# Patient Record
Sex: Male | Born: 1935 | Race: White | Hispanic: No | Marital: Married | Smoking: Former smoker
Health system: Southern US, Community
[De-identification: ages and names within clinical notes are randomized; demographics above are authoritative.]

## PROBLEM LIST (undated history)

## (undated) DIAGNOSIS — K859 Acute pancreatitis without necrosis or infection, unspecified: Secondary | ICD-10-CM

## (undated) DIAGNOSIS — I509 Heart failure, unspecified: Secondary | ICD-10-CM

## (undated) DIAGNOSIS — Z950 Presence of cardiac pacemaker: Secondary | ICD-10-CM

## (undated) DIAGNOSIS — N400 Enlarged prostate without lower urinary tract symptoms: Secondary | ICD-10-CM

## (undated) DIAGNOSIS — K219 Gastro-esophageal reflux disease without esophagitis: Secondary | ICD-10-CM

## (undated) DIAGNOSIS — J189 Pneumonia, unspecified organism: Secondary | ICD-10-CM

## (undated) DIAGNOSIS — J45909 Unspecified asthma, uncomplicated: Secondary | ICD-10-CM

## (undated) DIAGNOSIS — R0600 Dyspnea, unspecified: Secondary | ICD-10-CM

## (undated) DIAGNOSIS — I4891 Unspecified atrial fibrillation: Secondary | ICD-10-CM

## (undated) DIAGNOSIS — J449 Chronic obstructive pulmonary disease, unspecified: Secondary | ICD-10-CM

## (undated) DIAGNOSIS — C801 Malignant (primary) neoplasm, unspecified: Secondary | ICD-10-CM

## (undated) DIAGNOSIS — C679 Malignant neoplasm of bladder, unspecified: Secondary | ICD-10-CM

## (undated) HISTORY — PX: BI-VENTRICULAR PACEMAKER INSERTION (CRT-P): SHX5750

## (undated) HISTORY — PX: HERNIA REPAIR: SHX51

## (undated) HISTORY — DX: Pneumonia, unspecified organism: J18.9

## (undated) HISTORY — PX: FRACTURE SURGERY: SHX138

## (undated) HISTORY — DX: Gastro-esophageal reflux disease without esophagitis: K21.9

## (undated) HISTORY — PX: CARDIOVERSION: SHX1299

## (undated) HISTORY — PX: BLADDER SURGERY: SHX569

## (undated) HISTORY — DX: Acute pancreatitis without necrosis or infection, unspecified: K85.90

## (undated) HISTORY — PX: LUNG REMOVAL, PARTIAL: SHX233

## (undated) HISTORY — DX: Benign prostatic hyperplasia without lower urinary tract symptoms: N40.0

## (undated) HISTORY — PX: INSERT / REPLACE / REMOVE PACEMAKER: SUR710

---
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
## 2004-12-09 ENCOUNTER — Ambulatory Visit (HOSPITAL_COMMUNITY): Admission: RE | Admit: 2004-12-09 | Discharge: 2004-12-09 | Payer: Self-pay | Admitting: Internal Medicine

## 2013-08-10 DIAGNOSIS — I2699 Other pulmonary embolism without acute cor pulmonale: Secondary | ICD-10-CM

## 2013-08-10 HISTORY — DX: Other pulmonary embolism without acute cor pulmonale: I26.99

## 2013-09-26 DIAGNOSIS — I82409 Acute embolism and thrombosis of unspecified deep veins of unspecified lower extremity: Secondary | ICD-10-CM

## 2013-09-26 HISTORY — DX: Acute embolism and thrombosis of unspecified deep veins of unspecified lower extremity: I82.409

## 2015-02-28 DIAGNOSIS — C3412 Malignant neoplasm of upper lobe, left bronchus or lung: Secondary | ICD-10-CM

## 2015-02-28 HISTORY — DX: Malignant neoplasm of upper lobe, left bronchus or lung: C34.12

## 2015-09-28 DIAGNOSIS — I2510 Atherosclerotic heart disease of native coronary artery without angina pectoris: Secondary | ICD-10-CM | POA: Insufficient documentation

## 2015-09-28 DIAGNOSIS — J449 Chronic obstructive pulmonary disease, unspecified: Secondary | ICD-10-CM

## 2015-09-28 HISTORY — DX: Atherosclerotic heart disease of native coronary artery without angina pectoris: I25.10

## 2015-09-28 HISTORY — DX: Chronic obstructive pulmonary disease, unspecified: J44.9

## 2015-12-01 DIAGNOSIS — I4891 Unspecified atrial fibrillation: Secondary | ICD-10-CM | POA: Diagnosis not present

## 2015-12-01 DIAGNOSIS — K859 Acute pancreatitis without necrosis or infection, unspecified: Secondary | ICD-10-CM | POA: Diagnosis not present

## 2016-01-18 DIAGNOSIS — Z950 Presence of cardiac pacemaker: Secondary | ICD-10-CM

## 2016-01-18 DIAGNOSIS — Z9889 Other specified postprocedural states: Secondary | ICD-10-CM | POA: Insufficient documentation

## 2016-01-18 HISTORY — DX: Other specified postprocedural states: Z98.890

## 2016-01-18 HISTORY — PX: AV NODE ABLATION: EP1193

## 2016-01-18 HISTORY — DX: Presence of cardiac pacemaker: Z95.0

## 2016-06-01 DIAGNOSIS — J8410 Pulmonary fibrosis, unspecified: Secondary | ICD-10-CM

## 2016-06-01 HISTORY — DX: Pulmonary fibrosis, unspecified: J84.10

## 2016-12-31 ENCOUNTER — Encounter: Payer: Self-pay | Admitting: Cardiology

## 2016-12-31 DIAGNOSIS — E785 Hyperlipidemia, unspecified: Secondary | ICD-10-CM

## 2016-12-31 DIAGNOSIS — Z9981 Dependence on supplemental oxygen: Secondary | ICD-10-CM

## 2016-12-31 DIAGNOSIS — G4733 Obstructive sleep apnea (adult) (pediatric): Secondary | ICD-10-CM

## 2016-12-31 DIAGNOSIS — I5030 Unspecified diastolic (congestive) heart failure: Secondary | ICD-10-CM

## 2016-12-31 DIAGNOSIS — Z7901 Long term (current) use of anticoagulants: Secondary | ICD-10-CM

## 2016-12-31 DIAGNOSIS — I482 Chronic atrial fibrillation, unspecified: Secondary | ICD-10-CM

## 2016-12-31 HISTORY — DX: Hyperlipidemia, unspecified: E78.5

## 2016-12-31 HISTORY — DX: Long term (current) use of anticoagulants: Z79.01

## 2016-12-31 HISTORY — DX: Dependence on supplemental oxygen: Z99.81

## 2016-12-31 HISTORY — DX: Chronic atrial fibrillation, unspecified: I48.20

## 2016-12-31 HISTORY — DX: Unspecified diastolic (congestive) heart failure: I50.30

## 2016-12-31 HISTORY — DX: Obstructive sleep apnea (adult) (pediatric): G47.33

## 2017-01-01 ENCOUNTER — Encounter: Payer: Self-pay | Admitting: Cardiology

## 2017-01-01 ENCOUNTER — Ambulatory Visit (INDEPENDENT_AMBULATORY_CARE_PROVIDER_SITE_OTHER): Payer: Medicare Other | Admitting: Cardiology

## 2017-01-01 VITALS — BP 112/76 | HR 80 | Ht 72.0 in | Wt 195.0 lb

## 2017-01-01 DIAGNOSIS — Z7901 Long term (current) use of anticoagulants: Secondary | ICD-10-CM | POA: Diagnosis not present

## 2017-01-01 DIAGNOSIS — I482 Chronic atrial fibrillation, unspecified: Secondary | ICD-10-CM

## 2017-01-01 DIAGNOSIS — I5032 Chronic diastolic (congestive) heart failure: Secondary | ICD-10-CM

## 2017-01-01 DIAGNOSIS — Z9889 Other specified postprocedural states: Secondary | ICD-10-CM | POA: Diagnosis not present

## 2017-01-01 DIAGNOSIS — Z950 Presence of cardiac pacemaker: Secondary | ICD-10-CM | POA: Diagnosis not present

## 2017-01-01 NOTE — Patient Instructions (Signed)
Medication Instructions:  Your physician recommends that you continue on your current medications as directed. Please refer to the Current Medication list given to you today.   Labwork: Your physician recommends that you return for lab work in: today. BMP, BNP, pt/INR.   Testing/Procedures: None  Follow-Up: Your physician wants you to follow-up in: 6 months. You will receive a reminder letter in the mail two months in advance. If you don't receive a letter, please call our office to schedule the follow-up appointment.   Any Other Special Instructions Will Be Listed Below (If Applicable).     If you need a refill on your cardiac medications before your next appointment, please call your pharmacy.

## 2017-01-01 NOTE — Progress Notes (Signed)
Cardiology Office Note:    Date:  01/01/2017   ID:  Jerome Newman, DOB Jul 31, 1929, MRN [id number]  PCP:  Dr Delorise Jackson  Cardiologist:  Shirlee More, MD       ASSESSMENT:    1. Chronic diastolic congestive heart failure ([place])   2. Chronic atrial fibrillation (HCC)   3. Status post biventricular pacemaker   4. S/P AV nodal ablation   5. Chronic anticoagulation    PLAN:    In order of problems listed above:  1. Stable compensated continue his current diuretic sodium restriction at home self management. Will check renal function and BNP level today with ongoing diuretic treatment. 2. Stable improved with AV nodal ablation and pacemaker asymptomatic and continue anti-coagulation 3. Stable continue to follow in device clinic. 4. Stable pacemaker dependent. 5. Stable continue warfarin goal INR 2-3.5, long-term is interested in home pro time monitoring.  Next appointment: 6 month   Medication Adjustments/Labs and Tests Ordered: Current medicines are reviewed at length with the patient today.  Concerns regarding medicines are outlined above.  Orders Placed This Encounter  Procedures  . INR/PT  . Basic Metabolic Panel (BMET)  . B Nat Peptide   No orders of the defined types were placed in this encounter.   Chief Complaint  Patient presents with  . Follow-up    Routine flup appt     History of Present Illness:    Jerome Newman is a 83 y.o. male with a hx of lung Cancer, CHF, chronic Atrial Fibrillation, AVN ablation and BiV pacemaker, chronic anticoagulation for AF and previous remote DVT and PE. He was seen at [facility] ED in April with weakness.Overall he is remarkably improved with physical therapy strength and endurance are nearly back to normal no shortness of breath chest pain palpitations syncope or bleeding complication of his warfarin. He's had no recurrent episodes of weakness. His pacemaker is followed at [place] [facility] device [facility]     Compliance with diet,  lifestyle and medications: Yes Past Medical History:  Diagnosis Date  . BPH (benign prostatic hyperplasia)   . GERD (gastroesophageal reflux disease)   . Pancreatitis   . Pneumonia     Past Surgical History:  Procedure Laterality Date  . AV NODE ABLATION  01/18/2016  . BI-VENTRICULAR PACEMAKER INSERTION (CRT-P)    . CARDIOVERSION    . HERNIA REPAIR    . LUNG REMOVAL, PARTIAL Right     Current Medications: Current Meds  Medication Sig  . potassium chloride SA (KLOR-CON M20) 20 MEQ tablet Take 20 mEq by mouth daily.  Marland Kitchen warfarin (COUMADIN) 2.5 MG tablet Currently taking 1.25mg /1.25mg /2.5mg      Allergies:   Gabapentin and Nsaids   Social History   Social History  . Marital status: Married    Spouse name: N/A  . Number of children: N/A  . Years of education: N/A   Social History Main Topics  . Smoking status: Former Research scientist (life sciences)  . Smokeless tobacco: Never Used  . Alcohol use None  . Drug use: Unknown  . Sexual activity: Not Asked   Other Topics Concern  . None   Social History Narrative  . None     Family History: The patient's family history includes Cancer in his sister; Heart disease in his father. ROS:   Please see the history of present illness.    All other systems reviewed and are negative.  EKGs/Labs/Other Studies Reviewed:    The following studies were reviewed  [facility] including [facility]  and labs.  Physical Exam:    VS:  BP 112/76   Pulse 80   Ht 6' (1.829 m)   Wt 195 lb (88.5 kg)   SpO2 97%   BMI 26.45 kg/m     Wt Readings from Last 3 Encounters:  01/01/17 195 lb (88.5 kg)     GEN:  Well nourished, well developed in no acute distress HEENT: Normal NECK: No JVD; No carotid bruits LYMPHATICS: No lymphadenopathy CARDIAC: Variable first heart sound paced rhythm RRR, no murmurs, rubs, gallops RESPIRATORY:  Clear to auscultation without rales, wheezing or rhonchi  ABDOMEN: Soft, non-tender, non-distended MUSCULOSKELETAL:   No edema; No deformity  SKIN: Warm and dry NEUROLOGIC:  Alert and oriented x 3 PSYCHIATRIC:  Normal affect    Signed, Shirlee More, MD  01/01/2017 4:48 PM    [facility]

## 2017-01-03 ENCOUNTER — Ambulatory Visit (INDEPENDENT_AMBULATORY_CARE_PROVIDER_SITE_OTHER): Payer: BLUE CROSS/BLUE SHIELD | Admitting: Pharmacist

## 2017-01-03 DIAGNOSIS — I482 Chronic atrial fibrillation, unspecified: Secondary | ICD-10-CM

## 2017-01-03 DIAGNOSIS — Z5181 Encounter for therapeutic drug level monitoring: Secondary | ICD-10-CM

## 2017-01-03 DIAGNOSIS — Z7901 Long term (current) use of anticoagulants: Secondary | ICD-10-CM

## 2017-01-06 LAB — BASIC METABOLIC PANEL
BUN/Creatinine Ratio: 16 (ref 10–24)
BUN: 18 mg/dL (ref 8–27)
CALCIUM: 9.9 mg/dL (ref 8.6–10.2)
CO2: 26 mmol/L (ref 20–29)
CREATININE: 1.16 mg/dL (ref 0.76–1.27)
Chloride: 100 mmol/L (ref 96–106)
GFR calc Af Amer: 66 mL/min/{1.73_m2} (ref >59)
GFR, EST NON AFRICAN AMERICAN: 57 mL/min/{1.73_m2} — AB (ref >59)
Glucose: 98 mg/dL (ref 65–99)
Potassium: 4.3 mmol/L (ref 3.5–5.2)
SODIUM: 141 mmol/L (ref 134–144)

## 2017-01-06 LAB — PROTIME-INR
INR: 3.4 — ABNORMAL HIGH (ref 0.8–1.2)
Prothrombin Time: 33 s — ABNORMAL HIGH (ref 9.1–12.0)

## 2017-01-06 LAB — BRAIN NATRIURETIC PEPTIDE

## 2017-01-10 ENCOUNTER — Other Ambulatory Visit: Payer: Self-pay | Admitting: Cardiology

## 2017-01-16 ENCOUNTER — Telehealth: Payer: Self-pay

## 2017-01-16 DIAGNOSIS — Z7901 Long term (current) use of anticoagulants: Secondary | ICD-10-CM

## 2017-01-16 NOTE — Telephone Encounter (Signed)
Pt walk into the office today for pt/inr blood draw for management of warfarin.

## 2017-01-17 ENCOUNTER — Ambulatory Visit (INDEPENDENT_AMBULATORY_CARE_PROVIDER_SITE_OTHER): Payer: BLUE CROSS/BLUE SHIELD | Admitting: Pharmacist

## 2017-01-17 DIAGNOSIS — Z5181 Encounter for therapeutic drug level monitoring: Secondary | ICD-10-CM

## 2017-01-17 DIAGNOSIS — I482 Chronic atrial fibrillation, unspecified: Secondary | ICD-10-CM

## 2017-01-17 DIAGNOSIS — Z7901 Long term (current) use of anticoagulants: Secondary | ICD-10-CM

## 2017-01-17 LAB — PROTIME-INR
INR: 3.3 — ABNORMAL HIGH (ref 0.8–1.2)
PROTHROMBIN TIME: 32.1 s — AB (ref 9.1–12.0)

## 2017-02-01 ENCOUNTER — Other Ambulatory Visit: Payer: Self-pay

## 2017-02-01 ENCOUNTER — Telehealth: Payer: Self-pay

## 2017-02-01 MED ORDER — POTASSIUM CHLORIDE CRYS ER 20 MEQ PO TBCR: 20.0000 meq | EXTENDED_RELEASE_TABLET | Freq: Every day | ORAL | 3 refills | Status: DC

## 2017-02-01 NOTE — Telephone Encounter (Signed)
Refill sent to [facility] and pharmacy changed to [facility].

## 2017-02-01 NOTE — Telephone Encounter (Signed)
Patient would like to have potassium sent to [id number] and change pharmacy to [facility] for all meds .thanks cn

## 2017-02-19 ENCOUNTER — Ambulatory Visit (INDEPENDENT_AMBULATORY_CARE_PROVIDER_SITE_OTHER): Payer: Medicare Other

## 2017-02-19 DIAGNOSIS — Z7901 Long term (current) use of anticoagulants: Secondary | ICD-10-CM | POA: Diagnosis not present

## 2017-02-19 DIAGNOSIS — I482 Chronic atrial fibrillation, unspecified: Secondary | ICD-10-CM

## 2017-02-19 DIAGNOSIS — Z5181 Encounter for therapeutic drug level monitoring: Secondary | ICD-10-CM

## 2017-02-19 LAB — POCT INR: INR: 2.9

## 2017-03-21 ENCOUNTER — Ambulatory Visit (INDEPENDENT_AMBULATORY_CARE_PROVIDER_SITE_OTHER): Payer: Medicare Other | Admitting: Cardiovascular Disease

## 2017-03-21 DIAGNOSIS — I482 Chronic atrial fibrillation, unspecified: Secondary | ICD-10-CM

## 2017-03-21 DIAGNOSIS — Z5181 Encounter for therapeutic drug level monitoring: Secondary | ICD-10-CM | POA: Diagnosis not present

## 2017-03-21 DIAGNOSIS — Z7901 Long term (current) use of anticoagulants: Secondary | ICD-10-CM

## 2017-03-21 LAB — POCT INR: INR: 2.9

## 2017-04-25 ENCOUNTER — Ambulatory Visit (INDEPENDENT_AMBULATORY_CARE_PROVIDER_SITE_OTHER): Payer: Medicare Other | Admitting: *Deleted

## 2017-04-25 DIAGNOSIS — Z5181 Encounter for therapeutic drug level monitoring: Secondary | ICD-10-CM | POA: Diagnosis not present

## 2017-04-25 DIAGNOSIS — I482 Chronic atrial fibrillation, unspecified: Secondary | ICD-10-CM

## 2017-04-25 DIAGNOSIS — Z7901 Long term (current) use of anticoagulants: Secondary | ICD-10-CM

## 2017-04-25 LAB — POCT INR: INR: 3.9

## 2017-05-13 ENCOUNTER — Ambulatory Visit (INDEPENDENT_AMBULATORY_CARE_PROVIDER_SITE_OTHER): Payer: Medicare Other

## 2017-05-13 ENCOUNTER — Other Ambulatory Visit: Payer: Self-pay | Admitting: Cardiology

## 2017-05-13 DIAGNOSIS — Z5181 Encounter for therapeutic drug level monitoring: Secondary | ICD-10-CM | POA: Diagnosis not present

## 2017-05-13 DIAGNOSIS — Z7901 Long term (current) use of anticoagulants: Secondary | ICD-10-CM

## 2017-05-13 DIAGNOSIS — I482 Chronic atrial fibrillation, unspecified: Secondary | ICD-10-CM

## 2017-05-13 DIAGNOSIS — I2699 Other pulmonary embolism without acute cor pulmonale: Secondary | ICD-10-CM

## 2017-05-13 LAB — PROTIME-INR
INR: 2.5 — AB (ref 0.8–1.2)
INR: 2.5 — AB (ref 0.9–1.1)
PROTHROMBIN TIME: 26.9 s — AB (ref 9.1–12.0)

## 2017-06-03 ENCOUNTER — Ambulatory Visit (INDEPENDENT_AMBULATORY_CARE_PROVIDER_SITE_OTHER): Payer: Medicare Other | Admitting: *Deleted

## 2017-06-03 DIAGNOSIS — Z5181 Encounter for therapeutic drug level monitoring: Secondary | ICD-10-CM | POA: Diagnosis not present

## 2017-06-03 DIAGNOSIS — I482 Chronic atrial fibrillation, unspecified: Secondary | ICD-10-CM

## 2017-06-03 DIAGNOSIS — Z7901 Long term (current) use of anticoagulants: Secondary | ICD-10-CM | POA: Diagnosis not present

## 2017-06-03 DIAGNOSIS — I2699 Other pulmonary embolism without acute cor pulmonale: Secondary | ICD-10-CM

## 2017-06-03 LAB — POCT INR: INR: 3

## 2017-06-03 NOTE — Patient Instructions (Signed)
Continue taking 1/2 tablet daily except 1 tablet on Tuesdays and Fridays. Recheck INR in 4 weeks.

## 2017-06-10 ENCOUNTER — Emergency Department (HOSPITAL_COMMUNITY)
Admission: EM | Admit: 2017-06-10 | Discharge: 2017-06-10 | Disposition: A | Discharge status: Home / Self Care | Payer: Medicare Other | Attending: Emergency Medicine | Admitting: Emergency Medicine

## 2017-06-10 ENCOUNTER — Other Ambulatory Visit: Payer: Self-pay

## 2017-06-10 ENCOUNTER — Encounter (HOSPITAL_COMMUNITY): Payer: Self-pay

## 2017-06-10 DIAGNOSIS — I5030 Unspecified diastolic (congestive) heart failure: Secondary | ICD-10-CM | POA: Insufficient documentation

## 2017-06-10 DIAGNOSIS — M545 Low back pain: Secondary | ICD-10-CM | POA: Diagnosis present

## 2017-06-10 DIAGNOSIS — Z87891 Personal history of nicotine dependence: Secondary | ICD-10-CM | POA: Diagnosis not present

## 2017-06-10 DIAGNOSIS — J449 Chronic obstructive pulmonary disease, unspecified: Secondary | ICD-10-CM | POA: Insufficient documentation

## 2017-06-10 DIAGNOSIS — Z950 Presence of cardiac pacemaker: Secondary | ICD-10-CM | POA: Insufficient documentation

## 2017-06-10 DIAGNOSIS — Z7901 Long term (current) use of anticoagulants: Secondary | ICD-10-CM | POA: Insufficient documentation

## 2017-06-10 DIAGNOSIS — I2510 Atherosclerotic heart disease of native coronary artery without angina pectoris: Secondary | ICD-10-CM | POA: Diagnosis not present

## 2017-06-10 DIAGNOSIS — M5441 Lumbago with sciatica, right side: Secondary | ICD-10-CM | POA: Insufficient documentation

## 2017-06-10 DIAGNOSIS — Z79899 Other long term (current) drug therapy: Secondary | ICD-10-CM | POA: Insufficient documentation

## 2017-06-10 LAB — URINALYSIS, ROUTINE W REFLEX MICROSCOPIC
Bacteria, UA: NONE SEEN
Bilirubin Urine: NEGATIVE
Glucose, UA: NEGATIVE mg/dL
Hgb urine dipstick: NEGATIVE
Ketones, ur: 5 mg/dL — AB
Leukocytes, UA: NEGATIVE
Nitrite: NEGATIVE
Protein, ur: 100 mg/dL — AB
Specific Gravity, Urine: 1.024 (ref 1.005–1.030)
pH: 5 (ref 5.0–8.0)

## 2017-06-10 LAB — CBC WITH DIFFERENTIAL/PLATELET
Basophils Absolute: 0 10*3/uL (ref 0.0–0.1)
Basophils Relative: 0 %
Eosinophils Absolute: 0.2 10*3/uL (ref 0.0–0.7)
Eosinophils Relative: 3 %
HCT: 37.5 % — ABNORMAL LOW (ref 39.0–52.0)
Hemoglobin: 12.4 g/dL — ABNORMAL LOW (ref 13.0–17.0)
Lymphocytes Relative: 27 %
Lymphs Abs: 1.4 10*3/uL (ref 0.7–4.0)
MCH: 28.9 pg (ref 26.0–34.0)
MCHC: 33.1 g/dL (ref 30.0–36.0)
MCV: 87.4 fL (ref 78.0–100.0)
Monocytes Absolute: 0.2 10*3/uL (ref 0.1–1.0)
Monocytes Relative: 4 %
Neutro Abs: 3.3 10*3/uL (ref 1.7–7.7)
Neutrophils Relative %: 66 %
Platelets: 141 10*3/uL — ABNORMAL LOW (ref 150–400)
RBC: 4.29 MIL/uL (ref 4.22–5.81)
RDW: 14.9 % (ref 11.5–15.5)
WBC: 5.1 10*3/uL (ref 4.0–10.5)

## 2017-06-10 LAB — LIPASE, BLOOD: Lipase: 21 U/L (ref 11–51)

## 2017-06-10 LAB — COMPREHENSIVE METABOLIC PANEL
ALT: 12 U/L — ABNORMAL LOW (ref 17–63)
AST: 21 U/L (ref 15–41)
Albumin: 3.4 g/dL — ABNORMAL LOW (ref 3.5–5.0)
Alkaline Phosphatase: 99 U/L (ref 38–126)
Anion gap: 7 (ref 5–15)
BUN: 20 mg/dL (ref 6–20)
CO2: 24 mmol/L (ref 22–32)
Calcium: 9.3 mg/dL (ref 8.9–10.3)
Chloride: 108 mmol/L (ref 101–111)
Creatinine, Ser: 1.15 mg/dL (ref 0.61–1.24)
GFR calc Af Amer: >60 mL/min (ref >60)
GFR calc non Af Amer: 56 mL/min — ABNORMAL LOW (ref >60)
Glucose, Bld: 88 mg/dL (ref 65–99)
Potassium: 4.2 mmol/L (ref 3.5–5.1)
Sodium: 139 mmol/L (ref 135–145)
Total Bilirubin: 0.9 mg/dL (ref 0.3–1.2)
Total Protein: 5.7 g/dL — ABNORMAL LOW (ref 6.5–8.1)

## 2017-06-10 MED ORDER — OXYCODONE-ACETAMINOPHEN 5-325 MG PO TABS
1.0000 | ORAL_TABLET | ORAL | Status: DC | PRN
  Administered 2017-06-10: 1 via ORAL
  Filled 2017-06-10: qty 1

## 2017-06-10 MED ORDER — KETOROLAC TROMETHAMINE 15 MG/ML IJ SOLN
15.0000 mg | Freq: Once | INTRAMUSCULAR | Status: AC
  Administered 2017-06-10: 15 mg via INTRAMUSCULAR
  Filled 2017-06-10: qty 1

## 2017-06-10 MED ORDER — DEXAMETHASONE SODIUM PHOSPHATE 10 MG/ML IJ SOLN
10.0000 mg | Freq: Once | INTRAMUSCULAR | Status: AC
  Administered 2017-06-10: 10 mg via INTRAMUSCULAR
  Filled 2017-06-10: qty 1

## 2017-06-10 MED ORDER — OXYCODONE-ACETAMINOPHEN 5-325 MG PO TABS: 1.0000 | ORAL_TABLET | ORAL | 0 refills | Status: DC | PRN

## 2017-06-10 MED ORDER — HYDROMORPHONE HCL 1 MG/ML IJ SOLN
0.5000 mg | Freq: Once | INTRAMUSCULAR | Status: AC
  Administered 2017-06-10: 0.5 mg via INTRAMUSCULAR
  Filled 2017-06-10: qty 1

## 2017-06-10 MED ORDER — LORAZEPAM 0.5 MG PO TABS
0.5000 mg | ORAL_TABLET | Freq: Once | ORAL | Status: AC
  Administered 2017-06-10: 0.5 mg via ORAL
  Filled 2017-06-10: qty 1

## 2017-06-10 MED ORDER — HYDROMORPHONE HCL 1 MG/ML IJ SOLN
0.6000 mg | Freq: Once | INTRAMUSCULAR | Status: AC
  Administered 2017-06-10: 0.6 mg via INTRAMUSCULAR
  Filled 2017-06-10: qty 1

## 2017-06-10 MED ORDER — DEXAMETHASONE 4 MG PO TABS: 4.0000 mg | ORAL_TABLET | Freq: Two times a day (BID) | ORAL | 0 refills | Status: DC

## 2017-06-10 NOTE — Discharge Instructions (Signed)
Take decadron (steroid) until finished. Avoid NSAIDs (ibuprofen, aleve, etc) since you are on coumadin. Take pain medicine as needed.

## 2017-06-10 NOTE — ED Provider Notes (Signed)
[facility] EMERGENCY DEPARTMENT Provider Note   CSN: [id number] Arrival date & time: 06/10/17  1427     History   Chief Complaint Chief Complaint  Patient presents with  . Back Pain    HPI Jerome Newman is a 83 y.o. male.  HPI  83 year old male with back pain.  Lower back.  Radiation into right lower extremity.  Worsening over the past several days.  He has a past history of back pain but not to this degree.  He denies any acute trauma or strain.  No fevers or chills.  No urinary complaints.  No acute numbness or tingling or focal loss of strength.  Past Medical History:  Diagnosis Date  . BPH (benign prostatic hyperplasia)   . GERD (gastroesophageal reflux disease)   . Pancreatitis   . Pneumonia     Patient Active Problem List   Diagnosis Date Noted  . Encounter for therapeutic drug monitoring 01/03/2017  . Diastolic congestive heart failure ([place]) 12/31/2016  . Hyperlipidemia 12/31/2016  . OSA (obstructive sleep apnea) 12/31/2016  . Oxygen dependent 12/31/2016  . Chronic anticoagulation 12/31/2016  . Chronic atrial fibrillation ([place]) 12/31/2016  . Lung fibrosis ([place]) 06/01/2016  . Status post biventricular pacemaker 01/18/2016  . S/P AV nodal ablation 01/18/2016  . CAD (coronary artery disease) 09/28/2015  . COPD, mild ([place]) 09/28/2015  . Malignant neoplasm of upper lobe of left lung ([place]) 02/28/2015  . DVT (deep venous thrombosis) ([place]) 09/26/2013  . Pulmonary embolism ([place]) 08/10/2013    Past Surgical History:  Procedure Laterality Date  . AV NODE ABLATION  01/18/2016  . BI-VENTRICULAR PACEMAKER INSERTION (CRT-P)    . CARDIOVERSION    . HERNIA REPAIR    . LUNG REMOVAL, PARTIAL Right        Home Medications    Prior to Admission medications   Medication Sig Start Date End Date Taking? Authorizing Provider  acetaminophen (TYLENOL) 500 MG tablet Take 500 mg by mouth 2 (two) times daily.   Yes [provider]    Glycerin-Hypromellose-PEG 400 ([employer] DRY EYE RELIEF) 0.2-0.2-1 % SOLN Place 1 drop into both eyes 4 (four) times daily.   Yes [provider]  mirabegron ER (MYRBETRIQ) 25 MG TB24 tablet Take 25 mg by mouth daily.   Yes [provider]  pantoprazole (PROTONIX) 40 MG tablet Take 40 mg by mouth 2 (two) times daily. 12/27/16  Yes [provider]  potassium chloride SA (KLOR-CON M20) 20 MEQ tablet Take 1 tablet (20 mEq total) by mouth daily. 02/01/17  Yes Richardo Priest, MD  torsemide (DEMADEX) 10 MG tablet TAKE 1 TABLET BY MOUTH ONCE DAILY. Patient taking differently: TAKE 10 mg  TABLET BY MOUTH ONCE DAILY. 01/10/17  Yes Richardo Priest, MD  warfarin (COUMADIN) 2.5 MG tablet Take by mouth one time only at 6 PM. Currently taking 2.5 mg on Tuesday and Friday 1.25 mg  Monday, Wednesday, Thursday, Saturday and Sunday 11/22/16  Yes [provider]  dexamethasone (DECADRON) 4 MG tablet Take 1 tablet (4 mg total) by mouth 2 (two) times daily with a meal. 06/10/17   Virgel Manifold, MD  oxyCODONE-acetaminophen (PERCOCET/ROXICET) 5-325 MG tablet Take 1-2 tablets by mouth every 4 (four) hours as needed for severe pain. 06/10/17   Virgel Manifold, MD    Family History Family History  Problem Relation Age of Onset  . Heart disease Father   . Cancer Sister     Social History Social History   Tobacco  Use  . Smoking status: Former Smoker  . Smokeless tobacco: Never Used  Substance Use Topics  . Alcohol use: Not on file  . Drug use: Not on file     Allergies   Gabapentin and Nsaids   Review of Systems Review of Systems All systems reviewed and negative, other than as noted in HPI.   Physical Exam Updated Vital Signs BP 131/71   Pulse 86   Temp 98.1 F (36.7 C) (Axillary)   Resp 17   Ht 6' (1.829 m)   Wt 88.5 kg (195 lb)   SpO2 95%   BMI 26.45 kg/m   Physical Exam  Constitutional: He appears well-developed and well-nourished. No distress.  HENT:   Head: Normocephalic and atraumatic.  Eyes: Conjunctivae are normal. Right eye exhibits no discharge. Left eye exhibits no discharge.  Neck: Neck supple.  Cardiovascular: Normal rate, regular rhythm and normal heart sounds. Exam reveals no gallop and no friction rub.  No murmur heard. Pulmonary/Chest: Effort normal and breath sounds normal. No respiratory distress.  Abdominal: Soft. He exhibits no distension. There is no tenderness.  Musculoskeletal: He exhibits no edema or tenderness.  Tenderness to palpation in the right paralumbar spine.  No overlying skin changes.  Sensation is intact to light touch bilateral lower extremities.  Strength is 5 out of 5 although he does have significant pain with movement.  Positive straight leg test bilaterally.  Neurological: He is alert.  Skin: Skin is warm and dry.  Psychiatric: He has a normal mood and affect. His behavior is normal. Thought content normal.  Nursing note and vitals reviewed.    ED Treatments / Results  Labs (all labs ordered are listed, but only abnormal results are displayed) Labs Reviewed  COMPREHENSIVE METABOLIC PANEL - Abnormal; Notable for the following components:      Result Value   Total Protein 5.7 (*)    Albumin 3.4 (*)    ALT 12 (*)    GFR calc non Af Amer 56 (*)    All other components within normal limits  URINALYSIS, ROUTINE W REFLEX MICROSCOPIC - Abnormal; Notable for the following components:   Ketones, ur 5 (*)    Protein, ur 100 (*)    Squamous Epithelial / LPF 0-5 (*)    All other components within normal limits  CBC WITH DIFFERENTIAL/PLATELET - Abnormal; Notable for the following components:   Hemoglobin 12.4 (*)    HCT 37.5 (*)    Platelets 141 (*)    All other components within normal limits  LIPASE, BLOOD    EKG  EKG Interpretation None       Radiology No results found.  Procedures Procedures (including critical care time)  Medications Ordered in ED Medications   oxyCODONE-acetaminophen (PERCOCET/ROXICET) 5-325 MG per tablet 1 tablet (1 tablet Oral Given 06/10/17 1443)  HYDROmorphone (DILAUDID) injection 0.6 mg (0.6 mg Intramuscular Given 06/10/17 1826)  dexamethasone (DECADRON) injection 10 mg (10 mg Intramuscular Given 06/10/17 1826)  LORazepam (ATIVAN) tablet 0.5 mg (0.5 mg Oral Given 06/10/17 1827)  ketorolac (TORADOL) 15 MG/ML injection 15 mg (15 mg Intramuscular Given 06/10/17 2210)  HYDROmorphone (DILAUDID) injection 0.5 mg (0.5 mg Intramuscular Given 06/10/17 2210)     Initial Impression / Assessment and Plan / ED Course  I have reviewed the triage vital signs and the nursing notes.  Pertinent labs & imaging results that were available during my care of the patient were reviewed by me and considered in my medical decision making (see chart  for details).     83 year old male with lower back pain.  Some radicular symptoms into right lower extremity.  Nonfocal neurological exam.  He is treated symptomatically with some improvement.  He was stood at bedside and took a couple steps.  He states that he is feeling better but is still having significant pain.  Additional medicine was ordered.  He was offered admission for ongoing symptomatic treatment.  He is electing to go home and continue symptomatic treatment.  He has a pacemaker precluding MRI.  Advise following up with family doctor, potentially neurosurgery if he has significant continued symptoms.  Emergent return precautions were discussed  Final Clinical Impressions(s) / ED Diagnoses   Final diagnoses:  Acute midline low back pain with right-sided sciatica    ED Discharge Orders        Ordered    oxyCODONE-acetaminophen (PERCOCET/ROXICET) 5-325 MG tablet  Every 4 hours PRN     06/10/17 2220    dexamethasone (DECADRON) 4 MG tablet  2 times daily with meals     06/10/17 2220       Virgel Manifold, MD 06/18/17 1024

## 2017-06-10 NOTE — ED Triage Notes (Signed)
Per PT and family, pt is coming from home with back pain. Hx of the same and reports that it has gotten worse. Hx of arthritis and herniated discs.

## 2017-06-28 NOTE — Progress Notes (Signed)
Cardiology Office Note:    Date:  07/01/2017   ID:  Jerome Newman, DOB 1930-05-28, MRN [id number]  PCP:  [name]., MD  Cardiologist:  Shirlee More, MD    Referring MD: [name]., MD    ASSESSMENT:    1. Chronic atrial fibrillation ([place])   2. Chronic anticoagulation   3. Status post biventricular pacemaker   4. Chronic diastolic congestive heart failure (HCC)   5. Other pulmonary embolism without acute cor pulmonale, unspecified chronicity (HCC)    PLAN:    In order of problems listed above:  1. Stable he is asymptomatic since AV nodal ablation and biventricular pacemaker and will remain anticoagulated.  His device is followed through [facility]/ He will switch follow-up to my practice in July 2. continue warfarin goal INR is 2.5 managed in my practice 3. Stable function  4. Stable compensated continue current diuretic check BMP regarding renal function potassium 5. Stable continue anticoagulation 6.    Next appointment: 6 months   Medication Adjustments/Labs and Tests Ordered: Current medicines are reviewed at length with the patient today.  Concerns regarding medicines are outlined above.  No orders of the defined types were placed in this encounter.  No orders of the defined types were placed in this encounter.   Chief Complaint  Patient presents with  . Follow-up    History of Present Illness:    Jerome Newman is a 83 y.o. male with a hx of lung Cancer, CHF, chronic Atrial Fibrillation, AVN ablation and BiV pacemaker, chronic anticoagulation for AF and previous remote DVT and PE.  last seen in July 2018. His pacemaker care is with EP at [facility] in [place].last device check in this note. Compliance with diet, lifestyle and medications: Yes His predominant problem now is low back pain.  He is not having shortness of breath chest pain palpitations syncope or bleeding complications of his anticoagulant Past Medical History:  Diagnosis Date  . BPH (benign  prostatic hyperplasia)   . GERD (gastroesophageal reflux disease)   . Pancreatitis   . Pneumonia     Past Surgical History:  Procedure Laterality Date  . AV NODE ABLATION  01/18/2016  . BI-VENTRICULAR PACEMAKER INSERTION (CRT-P)    . CARDIOVERSION    . HERNIA REPAIR    . LUNG REMOVAL, PARTIAL Right     Current Medications: Current Meds  Medication Sig  . acetaminophen (TYLENOL) 500 MG tablet Take 500 mg by mouth 2 (two) times daily.  . mirabegron ER (MYRBETRIQ) 25 MG TB24 tablet Take 25 mg by mouth daily.  . pantoprazole (PROTONIX) 40 MG tablet Take 40 mg by mouth 2 (two) times daily.  . potassium chloride SA (KLOR-CON M20) 20 MEQ tablet Take 1 tablet (20 mEq total) by mouth daily.  Marland Kitchen torsemide (DEMADEX) 10 MG tablet TAKE 1 TABLET BY MOUTH ONCE DAILY.  Marland Kitchen warfarin (COUMADIN) 2.5 MG tablet Take by mouth one time only at 6 PM. Currently taking 2.5 mg on Tuesday and Friday 1.25 mg  Monday, Wednesday, Thursday, Saturday and Sunday     Allergies:   Gabapentin and Nsaids   Social History   Socioeconomic History  . Marital status: Married    Spouse name: None  . Number of children: None  . Years of education: None  . Highest education level: None  Social Needs  . Financial resource strain: None  . Food insecurity - worry: None  . Food insecurity - inability: None  . Transportation needs - medical: None  .  Transportation needs - non-medical: None  Occupational History  . None  Tobacco Use  . Smoking status: Former Research scientist (life sciences)  . Smokeless tobacco: Never Used  Substance and Sexual Activity  . Alcohol use: None  . Drug use: None  . Sexual activity: None  Other Topics Concern  . None  Social History Narrative  . None     Family History: The patient's family history includes Cancer in his sister; Heart disease in his father. ROS:   Please see the history of present illness.    All other systems reviewed and are negative.  EKGs/Labs/Other Studies Reviewed:    The  following studies were reviewed today:  Device check: Burna Forts, [place] - 04/01/2017 3:30 PM EDT REMOTE MONITORING ASSESSMENT Date of Transmission: October 1,2018 Patient MRN: [id number] Patient Name: Jerome Newman, 22-Jul-1929, 83 y.o. Following Provider: Mahala Menghini, MD Manufacturer of Device: [employer] Type of Device: CRT-P / BiVentricular Pacemaker See scanned/downloaded PDF report for the model numbers, serial numbers, and date of implant. Presenting Rhythm: BVP 85 Percentage RV / BiV Pacing: >99% Biventricular Pacing Device Findings: Scheduled test; Leads OK; Battery OK; No episodes; Meds include Coumadin Please see downloaded PDF file of transmission under Media Tab for full details of device interrogation to include, when applicable, battery status/charge time, lead trend data, and programmed parameters. Congestive Heart Failure Surveillance: Evidence of Possible Accumulated Fluid Plan: Findings forwarded to office for further review of Fluid issue.   CT chest 05/07/17:  IMPRESSION: 1. Evolving radiation changes in the left upper lobe. No residual or recurrent mass lesion identified. 2. Remote right upper lobe resection. 3. No evidence of local recurrence or metastatic disease. 4. Stable chronic left lower lobe pulmonary nodule consistent with a benign finding. 5. Extensive atherosclerosis, including Aortic Atherosclerosis  Recent Labs: 01/01/2017: BNP CANCELED 06/10/2017: ALT 12; BUN 20; Creatinine, Ser 1.15; Hemoglobin 12.4; Platelets 141; Potassium 4.2; Sodium 139  Recent Lipid Panel No results found for: CHOL, TRIG, HDL, CHOLHDL, VLDL, LDLCALC, LDLDIRECT  Physical Exam:    VS:  BP 102/68 (BP Location: Right Arm, Patient Position: Sitting, Cuff Size: Normal)   Pulse 71   Ht 6' (1.829 m)   Wt 198 lb (89.8 kg)   SpO2 91%   BMI 26.85 kg/m     Wt Readings from Last 3 Encounters:  07/01/17 198 lb (89.8 kg)  06/10/17 195 lb (88.5 kg)  01/01/17 195 lb (88.5 kg)       GEN:  Well nourished, well developed in no acute distress HEENT: Normal NECK: No JVD; No carotid bruits LYMPHATICS: No lymphadenopathy CARDIAC: RRR, no murmurs, rubs, gallops RESPIRATORY:  Clear to auscultation without rales, wheezing or rhonchi  ABDOMEN: Soft, non-tender, non-distended MUSCULOSKELETAL:  No edema; No deformity  SKIN: Warm and dry NEUROLOGIC:  Alert and oriented x 3 PSYCHIATRIC:  Normal affect    Signed, Shirlee More, MD  07/01/2017 1:32 PM    [facility]

## 2017-07-01 ENCOUNTER — Ambulatory Visit (INDEPENDENT_AMBULATORY_CARE_PROVIDER_SITE_OTHER): Payer: Medicare Other | Admitting: Cardiology

## 2017-07-01 ENCOUNTER — Ambulatory Visit (INDEPENDENT_AMBULATORY_CARE_PROVIDER_SITE_OTHER): Payer: Medicare Other | Admitting: *Deleted

## 2017-07-01 ENCOUNTER — Encounter: Payer: Self-pay | Admitting: Cardiology

## 2017-07-01 VITALS — BP 102/68 | HR 71 | Ht 72.0 in | Wt 198.0 lb

## 2017-07-01 DIAGNOSIS — I5032 Chronic diastolic (congestive) heart failure: Secondary | ICD-10-CM | POA: Diagnosis not present

## 2017-07-01 DIAGNOSIS — I482 Chronic atrial fibrillation, unspecified: Secondary | ICD-10-CM

## 2017-07-01 DIAGNOSIS — Z950 Presence of cardiac pacemaker: Secondary | ICD-10-CM | POA: Diagnosis not present

## 2017-07-01 DIAGNOSIS — Z7901 Long term (current) use of anticoagulants: Secondary | ICD-10-CM

## 2017-07-01 DIAGNOSIS — I2699 Other pulmonary embolism without acute cor pulmonale: Secondary | ICD-10-CM

## 2017-07-01 DIAGNOSIS — Z5181 Encounter for therapeutic drug level monitoring: Secondary | ICD-10-CM

## 2017-07-01 LAB — POCT INR: INR: 4.4

## 2017-07-01 NOTE — Patient Instructions (Signed)
Medication Instructions:  Your physician recommends that you continue on your current medications as directed. Please refer to the Current Medication list given to you today.  Labwork: Your physician recommends that you return for lab work in: today. BMP, CBC  Testing/Procedures: None  Follow-Up: Your physician wants you to follow-up in: 7 months. You will receive a reminder letter in the mail two months in advance. If you don't receive a letter, please call our office to schedule the follow-up appointment.  Any Other Special Instructions Will Be Listed Below (If Applicable).     If you need a refill on your cardiac medications before your next appointment, please call your pharmacy.

## 2017-07-01 NOTE — Patient Instructions (Signed)
Description   Do not take any Coumadin today then continue taking 1/2 tablet daily except 1 tablet on Tuesdays and Fridays. Have something dark green leafy today & remain consistent. Recheck INR in 2 weeks.

## 2017-07-02 LAB — BASIC METABOLIC PANEL
BUN/Creatinine Ratio: 16 (ref 10–24)
BUN: 18 mg/dL (ref 8–27)
CALCIUM: 9.7 mg/dL (ref 8.6–10.2)
CHLORIDE: 103 mmol/L (ref 96–106)
CO2: 25 mmol/L (ref 20–29)
Creatinine, Ser: 1.1 mg/dL (ref 0.76–1.27)
GFR calc non Af Amer: 61 mL/min/{1.73_m2} (ref >59)
GFR, EST AFRICAN AMERICAN: 70 mL/min/{1.73_m2} (ref >59)
GLUCOSE: 88 mg/dL (ref 65–99)
POTASSIUM: 4.4 mmol/L (ref 3.5–5.2)
Sodium: 145 mmol/L — ABNORMAL HIGH (ref 134–144)

## 2017-07-02 LAB — CBC WITH DIFFERENTIAL/PLATELET
BASOS ABS: 0 10*3/uL (ref 0.0–0.2)
Basos: 0 %
EOS (ABSOLUTE): 0.1 10*3/uL (ref 0.0–0.4)
Eos: 1 %
Hematocrit: 41.5 % (ref 37.5–51.0)
Hemoglobin: 14.1 g/dL (ref 13.0–17.7)
IMMATURE GRANS (ABS): 0 10*3/uL (ref 0.0–0.1)
Immature Granulocytes: 0 %
LYMPHS: 19 %
Lymphocytes Absolute: 1.5 10*3/uL (ref 0.7–3.1)
MCH: 29.2 pg (ref 26.6–33.0)
MCHC: 34 g/dL (ref 31.5–35.7)
MCV: 86 fL (ref 79–97)
Monocytes Absolute: 0.3 10*3/uL (ref 0.1–0.9)
Monocytes: 4 %
NEUTROS ABS: 5.8 10*3/uL (ref 1.4–7.0)
Neutrophils: 76 %
PLATELETS: 201 10*3/uL (ref 150–379)
RBC: 4.83 x10E6/uL (ref 4.14–5.80)
RDW: 15.6 % — ABNORMAL HIGH (ref 12.3–15.4)
WBC: 7.8 10*3/uL (ref 3.4–10.8)

## 2017-07-15 ENCOUNTER — Ambulatory Visit (INDEPENDENT_AMBULATORY_CARE_PROVIDER_SITE_OTHER): Payer: Medicare Other | Admitting: *Deleted

## 2017-07-15 DIAGNOSIS — Z5181 Encounter for therapeutic drug level monitoring: Secondary | ICD-10-CM | POA: Diagnosis not present

## 2017-07-15 DIAGNOSIS — I482 Chronic atrial fibrillation, unspecified: Secondary | ICD-10-CM

## 2017-07-15 DIAGNOSIS — I2699 Other pulmonary embolism without acute cor pulmonale: Secondary | ICD-10-CM

## 2017-07-15 DIAGNOSIS — Z7901 Long term (current) use of anticoagulants: Secondary | ICD-10-CM

## 2017-07-15 LAB — PROTIME-INR
INR: 2.8 — ABNORMAL HIGH (ref 0.8–1.2)
Prothrombin Time: 27.5 s — ABNORMAL HIGH (ref 9.1–12.0)

## 2017-07-29 ENCOUNTER — Ambulatory Visit (INDEPENDENT_AMBULATORY_CARE_PROVIDER_SITE_OTHER): Payer: Medicare Other | Admitting: *Deleted

## 2017-07-29 DIAGNOSIS — I482 Chronic atrial fibrillation, unspecified: Secondary | ICD-10-CM

## 2017-07-29 DIAGNOSIS — I2699 Other pulmonary embolism without acute cor pulmonale: Secondary | ICD-10-CM | POA: Diagnosis not present

## 2017-07-29 DIAGNOSIS — Z7901 Long term (current) use of anticoagulants: Secondary | ICD-10-CM

## 2017-07-29 DIAGNOSIS — Z5181 Encounter for therapeutic drug level monitoring: Secondary | ICD-10-CM | POA: Diagnosis not present

## 2017-07-29 LAB — POCT INR: INR: 3.3

## 2017-07-29 NOTE — Patient Instructions (Signed)
Description   Continue taking 1/2 tablet daily except 1 tablet on Tuesdays and Fridays. Remain consistent with leafy green vegetables.  Recheck INR in 3 weeks.

## 2017-08-19 ENCOUNTER — Ambulatory Visit (INDEPENDENT_AMBULATORY_CARE_PROVIDER_SITE_OTHER): Payer: Medicare Other

## 2017-08-19 DIAGNOSIS — Z5181 Encounter for therapeutic drug level monitoring: Secondary | ICD-10-CM

## 2017-08-19 DIAGNOSIS — Z7901 Long term (current) use of anticoagulants: Secondary | ICD-10-CM | POA: Diagnosis not present

## 2017-08-19 DIAGNOSIS — I482 Chronic atrial fibrillation, unspecified: Secondary | ICD-10-CM

## 2017-08-19 DIAGNOSIS — I2699 Other pulmonary embolism without acute cor pulmonale: Secondary | ICD-10-CM

## 2017-08-19 LAB — POCT INR: INR: 2.9

## 2017-08-19 NOTE — Patient Instructions (Signed)
Description   Spoke with pt's wife advised to have pt continue on same dosage 1/2 tablet daily except 1 tablet on Tuesdays and Fridays. Remain consistent with leafy green vegetables.  Recheck INR in 4 weeks.

## 2017-09-16 ENCOUNTER — Ambulatory Visit (INDEPENDENT_AMBULATORY_CARE_PROVIDER_SITE_OTHER): Payer: Medicare Other

## 2017-09-16 DIAGNOSIS — I2699 Other pulmonary embolism without acute cor pulmonale: Secondary | ICD-10-CM | POA: Diagnosis not present

## 2017-09-16 DIAGNOSIS — Z7901 Long term (current) use of anticoagulants: Secondary | ICD-10-CM

## 2017-09-16 DIAGNOSIS — I482 Chronic atrial fibrillation, unspecified: Secondary | ICD-10-CM

## 2017-09-16 DIAGNOSIS — Z5181 Encounter for therapeutic drug level monitoring: Secondary | ICD-10-CM | POA: Diagnosis not present

## 2017-09-16 LAB — POCT INR: INR: 3.8

## 2017-09-16 NOTE — Patient Instructions (Signed)
Description   Hold today's dose and then continue on same dosage 1/2 tablet daily except 1 tablet on Tuesdays and Fridays. Remain consistent with leafy green vegetables. Recheck INR in 2 weeks.

## 2017-09-30 ENCOUNTER — Other Ambulatory Visit: Payer: Self-pay

## 2017-09-30 ENCOUNTER — Ambulatory Visit (INDEPENDENT_AMBULATORY_CARE_PROVIDER_SITE_OTHER): Payer: Medicare Other

## 2017-09-30 DIAGNOSIS — I482 Chronic atrial fibrillation, unspecified: Secondary | ICD-10-CM

## 2017-09-30 DIAGNOSIS — Z5181 Encounter for therapeutic drug level monitoring: Secondary | ICD-10-CM | POA: Diagnosis not present

## 2017-09-30 DIAGNOSIS — Z7901 Long term (current) use of anticoagulants: Secondary | ICD-10-CM | POA: Diagnosis not present

## 2017-09-30 DIAGNOSIS — I2699 Other pulmonary embolism without acute cor pulmonale: Secondary | ICD-10-CM | POA: Diagnosis not present

## 2017-09-30 LAB — POCT INR: INR: 3.6

## 2017-09-30 NOTE — Patient Instructions (Signed)
Description   Hold today's dose, then proceed to take 1/2 tablet (1.25 mg) daily except for 1 tablet (2.5 mg) on Friday's. Remain consistent with leafy green vegetables. Recheck INR in 2 weeks.

## 2017-10-14 ENCOUNTER — Ambulatory Visit (INDEPENDENT_AMBULATORY_CARE_PROVIDER_SITE_OTHER): Payer: Medicare Other

## 2017-10-14 DIAGNOSIS — I482 Chronic atrial fibrillation, unspecified: Secondary | ICD-10-CM

## 2017-10-14 DIAGNOSIS — I2699 Other pulmonary embolism without acute cor pulmonale: Secondary | ICD-10-CM | POA: Diagnosis not present

## 2017-10-14 DIAGNOSIS — Z7901 Long term (current) use of anticoagulants: Secondary | ICD-10-CM | POA: Diagnosis not present

## 2017-10-14 DIAGNOSIS — Z5181 Encounter for therapeutic drug level monitoring: Secondary | ICD-10-CM | POA: Diagnosis not present

## 2017-10-14 LAB — POCT INR: INR: 3.5

## 2017-10-14 NOTE — Patient Instructions (Signed)
Description   Continue to take 1/2 tablet (1.25 mg) daily except for 1 tablet (2.5 mg) on Friday's. Remain consistent with leafy green vegetables. Recheck INR in 4 weeks. Please call the office with any medication changes or additions [phone].

## 2017-11-01 ENCOUNTER — Ambulatory Visit (INDEPENDENT_AMBULATORY_CARE_PROVIDER_SITE_OTHER): Payer: Self-pay | Admitting: Orthopaedic Surgery

## 2017-11-11 ENCOUNTER — Ambulatory Visit (INDEPENDENT_AMBULATORY_CARE_PROVIDER_SITE_OTHER): Payer: Medicare Other

## 2017-11-11 DIAGNOSIS — I2699 Other pulmonary embolism without acute cor pulmonale: Secondary | ICD-10-CM

## 2017-11-11 DIAGNOSIS — I482 Chronic atrial fibrillation, unspecified: Secondary | ICD-10-CM

## 2017-11-11 DIAGNOSIS — Z7901 Long term (current) use of anticoagulants: Secondary | ICD-10-CM | POA: Diagnosis not present

## 2017-11-11 DIAGNOSIS — Z5181 Encounter for therapeutic drug level monitoring: Secondary | ICD-10-CM

## 2017-11-11 LAB — POCT INR: INR: 3.2

## 2017-11-11 NOTE — Patient Instructions (Addendum)
Description   Continue to take 1/2 tablet (1.25 mg) daily except for 1 tablet (2.5 mg) on Friday's. Remain consistent with leafy green vegetables. Recheck INR in 6 weeks. Please call the office with any medication changes or additions [phone].    Please discontinue aspirin prescribed at hospital per Dr. Bettina Gavia.

## 2017-11-18 ENCOUNTER — Ambulatory Visit: Payer: Medicare Other | Admitting: Cardiology

## 2017-11-26 NOTE — Progress Notes (Signed)
Cardiology Office Note:    Date:  11/27/2017   ID:  Jerome Newman, DOB 13-Oct-1929, MRN [id number]  PCP:  Jerome Skeens., MD  Cardiologist:  Jerome More, MD    Referring MD: Jerome Skeens., MD    ASSESSMENT:    1. Chronic atrial fibrillation (Jerome Newman)   2. Chronic anticoagulation   3. S/P AV nodal ablation   4. Chronic diastolic congestive heart failure (HCC)   5. Status post biventricular pacemaker    PLAN:    In order of problems listed above:  1. Stable rate controlled after AV nodal ablation and permanent pacemaker continue anticoagulation 2. Stable continue warfarin he does not want to take a direct agent 3. Stable permanent pacemaker function is normal 4. Compensated continue his diuretic I do not know why it was stopped as he did not have orthostatic hypotension or signs of hypovolemia 5. Stable following our device clinic   Next appointment: 3 months   Medication Adjustments/Labs and Tests Ordered: Current medicines are reviewed at length with the patient today.  Concerns regarding medicines are outlined above.  No orders of the defined types were placed in this encounter.  No orders of the defined types were placed in this encounter.   Chief Complaint  Patient presents with  . Hospitalization Follow-up    dizzy episodes  . Atrial Fibrillation  . Anticoagulation  . Congestive Heart Failure    History of Present Illness:    Jerome Newman is a 84 y.o. male with a hx of lung Cancer, CHF, chronic Atrial Fibrillation, AVN ablation and BiV pacemaker, chronic anticoagulation for AF and previous remote DVT and PE.  last seen in 07/01/17. His pacemaker care is with EP at [facility] in [place]. He is referred by Dr Jerome Newman. Admitted to [facility]:  Admit date: 10/31/2017 Discharge date and time: 11/01/2017 10:23 AM  Admission Diagnoses: Presyncope  Discharge Diagnoses:  Principal Problem: Dizziness  Admission Condition: fair Discharged Condition: fair  Indication for  Admission: Presyncope likely from toresemide.   Hospital Course:  84 year old man with a history of chronic atrial fibrillation s/p biventricular pacemaker status post AV node ablation, CKD, COPD, GERD, systolic CHF, hypertension, pancreatitis, Stage II squamous cell lung cancer, RUL lobectomy who presented on Thursday 10/31/2017 with dizziness, extremity weakness, and headache. Patient admitted to observation for further workup. He was also complaining of left arm weakness and right leg numbness.. Neurology consulted not an IV tPA candidate as his INR is > 1.7 at 2.81 and platelets was low at 47. Recommended CTA which showed anterior and posterior intracranial circulation. No large vessel occlusion, aneurysm, or significant stenosis, also showed advanced cervical spondylosis with multifactorial moderate to severe canal stenosis at C5-6. MRI brain not compatible with pacemaker. TTE ordered showed Mild global LV hypokinesis. Ejection fraction is visually estimated at 45-50. Will hold off on new ACE-I or beta blocker due to dizziness. Will let Dr. Bettina Newman assess patient and decide.   Echo 11/01/17: Mild global LV hypokinesis. Ejection fraction is visually estimated at 45-50% Mild mitral regurgitation. There is mild aortic sclerosis noted, with no evidence of stenosis. Mild aortic regurgitation. Mild tricuspid regurgitation . Mildly dilated left atrium.  CTA neck: IMPRESSION: 1. Patent carotid and vertebral arteries. No dissection, aneurysm, or hemodynamically significant stenosis utilizing NASCET criteria. 2. Patent anterior and posterior intracranial circulation. No large vessel occlusion, aneurysm, or significant stenosis. 3. Advanced cervical spondylosis with multifactorial moderate to severe canal stenosis at C5-6.  INR 2.5 Plts 126000  Compliance with diet, lifestyle and medications: yes I reviewed the history with the patient and his wife.  The day that he was placed in observation he felt  badly weak vaguely unsteady which is been a common problem and was worse when he is taking medications to help with his urinary stream likely alpha blockers.  After CT scan he is particularly weak and unsteady was brought to the emergency room which resulted in observation.  His symptoms were not focal and were not TIA in nature.  His wife tells me his pacemaker is interrogated although I cannot find documentation in its function was normal.  He was seen by neurologist and CT of the cervical spine shows severe disc disease with cord stenosis.  Since then he is returned home his diuretic was stopped his weight went up several pounds and is back on his diuretic.  He has had no specific symptoms since he remains anticoagulated with warfarin at his choice and therapeutic. Past Medical History:  Diagnosis Date  . BPH (benign prostatic hyperplasia)   . GERD (gastroesophageal reflux disease)   . Pancreatitis   . Pneumonia     Past Surgical History:  Procedure Laterality Date  . AV NODE ABLATION  01/18/2016  . BI-VENTRICULAR PACEMAKER INSERTION (CRT-P)    . CARDIOVERSION    . HERNIA REPAIR    . LUNG REMOVAL, PARTIAL Right     Current Medications: Current Meds  Medication Sig  . acetaminophen (TYLENOL) 500 MG tablet Take 500 mg by mouth 2 (two) times daily.  Marland Kitchen atorvastatin (LIPITOR) 40 MG tablet Take 40 mg by mouth daily at 6 PM.  . pantoprazole (PROTONIX) 40 MG tablet Take 40 mg by mouth 2 (two) times daily.  . potassium chloride SA (KLOR-CON M20) 20 MEQ tablet Take 1 tablet (20 mEq total) by mouth daily.  Marland Kitchen torsemide (DEMADEX) 10 MG tablet TAKE 1 TABLET BY MOUTH ONCE DAILY.  Marland Kitchen warfarin (COUMADIN) 2.5 MG tablet Take by mouth one time only at 6 PM. Currently taking 2.5 mg on Friday 1.25 mg  Monday, Tuesday, Wednesday, Thursday, Saturday and Sunday     Allergies:   Gabapentin and Nsaids   Social History   Socioeconomic History  . Marital status: Married    Spouse name: Not on file  .  Number of children: Not on file  . Years of education: Not on file  . Highest education level: Not on file  Occupational History  . Not on file  Social Needs  . Financial resource strain: Not on file  . Food insecurity:    Worry: Not on file    Inability: Not on file  . Transportation needs:    Medical: Not on file    Non-medical: Not on file  Tobacco Use  . Smoking status: Former Research scientist (life sciences)  . Smokeless tobacco: Never Used  Substance and Sexual Activity  . Alcohol use: Not Currently  . Drug use: Not Currently  . Sexual activity: Not on file  Lifestyle  . Physical activity:    Days per week: Not on file    Minutes per session: Not on file  . Stress: Not on file  Relationships  . Social connections:    Talks on phone: Not on file    Gets together: Not on file    Attends religious service: Not on file    Active member of club or organization: Not on file    Attends meetings of clubs or organizations: Not on file  Relationship status: Not on file  Other Topics Concern  . Not on file  Social History Narrative  . Not on file     Family History: The patient's family history includes Cancer in his sister; Heart disease in his father. ROS:   Please see the history of present illness.    All other systems reviewed and are negative.  EKGs/Labs/Other Studies Reviewed:    The following studies were reviewed today:  Recent Labs: 01/01/2017: BNP CANCELED 06/10/2017: ALT 12 07/01/2017: BUN 18; Creatinine, Ser 1.10; Hemoglobin 14.1; Platelets 201; Potassium 4.4; Sodium 145  Recent Lipid Panel No results found for: CHOL, TRIG, HDL, CHOLHDL, VLDL, LDLCALC, LDLDIRECT  Physical Exam:    VS:  BP 120/80 (Patient Position: Standing)   Pulse 83   Ht 6' (1.829 m)   Wt 197 lb 12.8 oz (89.7 kg)   SpO2 98%   BMI 26.83 kg/m     Wt Readings from Last 3 Encounters:  11/27/17 197 lb 12.8 oz (89.7 kg)  07/01/17 198 lb (89.8 kg)  06/10/17 195 lb (88.5 kg)     GEN:  Well nourished,  well developed in no acute distress HEENT: Normal NECK: No JVD; No carotid bruits LYMPHATICS: No lymphadenopathy CARDIAC: RRR, no murmurs, rubs, gallops RESPIRATORY:  Clear to auscultation without rales, wheezing or rhonchi  ABDOMEN: Soft, non-tender, non-distended MUSCULOSKELETAL:  No edema; No deformity  SKIN: Warm and dry NEUROLOGIC:  Alert and oriented x 3 PSYCHIATRIC:  Normal affect    Signed, Jerome More, MD  11/27/2017 3:42 PM    [facility]

## 2017-11-27 ENCOUNTER — Ambulatory Visit (INDEPENDENT_AMBULATORY_CARE_PROVIDER_SITE_OTHER): Payer: Medicare Other | Admitting: Cardiology

## 2017-11-27 ENCOUNTER — Encounter: Payer: Self-pay | Admitting: Cardiology

## 2017-11-27 VITALS — BP 120/80 | HR 83 | Ht 72.0 in | Wt 197.8 lb

## 2017-11-27 DIAGNOSIS — Z7901 Long term (current) use of anticoagulants: Secondary | ICD-10-CM | POA: Diagnosis not present

## 2017-11-27 DIAGNOSIS — Z9889 Other specified postprocedural states: Secondary | ICD-10-CM

## 2017-11-27 DIAGNOSIS — I5032 Chronic diastolic (congestive) heart failure: Secondary | ICD-10-CM | POA: Diagnosis not present

## 2017-11-27 DIAGNOSIS — Z950 Presence of cardiac pacemaker: Secondary | ICD-10-CM | POA: Diagnosis not present

## 2017-11-27 DIAGNOSIS — I482 Chronic atrial fibrillation, unspecified: Secondary | ICD-10-CM

## 2017-11-27 DIAGNOSIS — Z5181 Encounter for therapeutic drug level monitoring: Secondary | ICD-10-CM | POA: Diagnosis not present

## 2017-11-27 DIAGNOSIS — R42 Dizziness and giddiness: Secondary | ICD-10-CM

## 2017-11-27 LAB — POCT INR: INR: 3.5 — AB (ref 2.0–3.0)

## 2017-11-27 NOTE — Patient Instructions (Signed)
Medication Instructions:  Your physician recommends that you continue on your current medications as directed. Please refer to the Current Medication list given to you today.   Labwork: None  Testing/Procedures: None  Follow-Up: Your physician recommends that you schedule a follow-up appointment in: 3 months with Dr Bettina Gavia   Any Other Special Instructions Will Be Listed Below (If Applicable).     If you need a refill on your cardiac medications before your next appointment, please call your pharmacy.

## 2017-12-03 ENCOUNTER — Telehealth: Payer: Self-pay | Admitting: Cardiology

## 2017-12-03 MED ORDER — ATORVASTATIN CALCIUM 40 MG PO TABS: 40.0000 mg | ORAL_TABLET | Freq: Every day | ORAL | 3 refills | Status: DC

## 2017-12-03 NOTE — Telephone Encounter (Signed)
Refill sent.

## 2017-12-03 NOTE — Telephone Encounter (Signed)
Has questions about his Lipitor

## 2018-01-01 ENCOUNTER — Ambulatory Visit (INDEPENDENT_AMBULATORY_CARE_PROVIDER_SITE_OTHER): Payer: Medicare Other

## 2018-01-01 DIAGNOSIS — I2699 Other pulmonary embolism without acute cor pulmonale: Secondary | ICD-10-CM | POA: Diagnosis not present

## 2018-01-01 DIAGNOSIS — I482 Chronic atrial fibrillation, unspecified: Secondary | ICD-10-CM

## 2018-01-01 DIAGNOSIS — Z7901 Long term (current) use of anticoagulants: Secondary | ICD-10-CM

## 2018-01-01 DIAGNOSIS — Z5181 Encounter for therapeutic drug level monitoring: Secondary | ICD-10-CM | POA: Diagnosis not present

## 2018-01-01 LAB — POCT INR: INR: 2.9 (ref 2.0–3.0)

## 2018-01-01 NOTE — Patient Instructions (Signed)
Description   Continue to take 1/2 tablet (1.25 mg) daily except for 1 tablet (2.5 mg) on Friday's. Remain consistent with leafy green vegetables. Recheck INR in 6 weeks. Please call the office with any medication changes or additions [phone].

## 2018-01-29 ENCOUNTER — Other Ambulatory Visit: Payer: Self-pay | Admitting: Cardiology

## 2018-02-06 ENCOUNTER — Ambulatory Visit (INDEPENDENT_AMBULATORY_CARE_PROVIDER_SITE_OTHER): Payer: Medicare Other

## 2018-02-06 DIAGNOSIS — I482 Chronic atrial fibrillation, unspecified: Secondary | ICD-10-CM

## 2018-02-06 DIAGNOSIS — Z5181 Encounter for therapeutic drug level monitoring: Secondary | ICD-10-CM | POA: Diagnosis not present

## 2018-02-06 DIAGNOSIS — Z7901 Long term (current) use of anticoagulants: Secondary | ICD-10-CM

## 2018-02-06 LAB — POCT INR: INR: 3.2 — AB (ref 2.0–3.0)

## 2018-02-06 NOTE — Patient Instructions (Signed)
Description   Continue to take 1/2 tablet (1.25 mg) daily except for 1 tablet (2.5 mg) on Friday's. Remain consistent with leafy green vegetables. Recheck INR in 6 weeks. Please call the office with any medication changes or additions [phone].

## 2018-03-20 ENCOUNTER — Ambulatory Visit (INDEPENDENT_AMBULATORY_CARE_PROVIDER_SITE_OTHER): Payer: Medicare Other

## 2018-03-20 DIAGNOSIS — Z7901 Long term (current) use of anticoagulants: Secondary | ICD-10-CM | POA: Diagnosis not present

## 2018-03-20 DIAGNOSIS — Z5181 Encounter for therapeutic drug level monitoring: Secondary | ICD-10-CM

## 2018-03-20 DIAGNOSIS — I482 Chronic atrial fibrillation, unspecified: Secondary | ICD-10-CM

## 2018-03-20 LAB — POCT INR: INR: 2.7 (ref 2.0–3.0)

## 2018-03-20 NOTE — Patient Instructions (Signed)
Description   Continue to take 1/2 tablet (1.25 mg) daily except for 1 tablet (2.5 mg) on Friday's. Remain consistent with leafy green vegetables. Recheck INR in 6 weeks. Please call the office with any medication changes or additions [phone].

## 2018-04-03 NOTE — Progress Notes (Signed)
Cardiology Office Note:    Date:  04/04/2018   ID:  Jerome Newman, DOB [birth year], MRN [id number]  PCP:  [name]., MD  Cardiologist:  Shirlee More, MD    Referring MD: [name]., MD    ASSESSMENT:    1. Chronic atrial fibrillation   2. S/P AV nodal ablation   3. Status post biventricular pacemaker   4. Chronic anticoagulation   5. Chronic diastolic congestive heart failure ([place])    PLAN:    In order of problems listed above:  1. Stable asymptomatic we manage his pacemaker in our practice and warfarin for stroke prophylaxis goal INR 2.5 2. Stable from our device clinic not on antiarrhythmic drug 3. Stable function followed in our device clinic in today's EKG he is pacemaker dependent 4. Stable continue his anticoagulant 5. He has no edema no evidence of volume overload check his electrolytes as well as proBNP and a CBC with his anticoagulant therapy   Next appointment: 3 months   Medication Adjustments/Labs and Tests Ordered: Current medicines are reviewed at length with the patient today.  Concerns regarding medicines are outlined above.  No orders of the defined types were placed in this encounter.  No orders of the defined types were placed in this encounter.   Chief Complaint  Patient presents with  . Follow-up    pacemaker  . Atrial Fibrillation  . Anticoagulation    History of Present Illness:    Jerome Newman is a 84 y.o. male with a hx of lung Cancer, CHF, chronic Atrial Fibrillation, AVN ablation and BiV pacemaker, chronic anticoagulation for AF and previous remote DVT and PE  last seen 11/27/17.Marland Kitchen Compliance with diet, lifestyle and medications: Yes  He is not pleased with the quality of his life he has difficulty with physical activity because of pain in his knees he finds himself to be vaguely unsteady after he had an episode of vertigo and short of breath intermittently even at rest with his lung disease and lung cancer and recent  radiation.  He has had hyponatremia in the past and his wife questions if we should check his electrolytes today.  No edema orthopnea chest pain palpitations syncope syncope or bleeding complication on anticoagulant Past Medical History:  Diagnosis Date  . BPH (benign prostatic hyperplasia)   . GERD (gastroesophageal reflux disease)   . Pancreatitis   . Pneumonia     Past Surgical History:  Procedure Laterality Date  . AV NODE ABLATION  01/18/2016  . BI-VENTRICULAR PACEMAKER INSERTION (CRT-P)    . CARDIOVERSION    . HERNIA REPAIR    . LUNG REMOVAL, PARTIAL Right     Current Medications: Current Meds  Medication Sig  . acetaminophen (TYLENOL) 500 MG tablet Take 500 mg by mouth 2 (two) times daily.  Marland Kitchen atorvastatin (LIPITOR) 40 MG tablet Take 1 tablet (40 mg total) by mouth daily at 6 PM.  . pantoprazole (PROTONIX) 40 MG tablet Take 40 mg by mouth 2 (two) times daily.  . potassium chloride SA (K-DUR,KLOR-CON) 20 MEQ tablet TAKE 1 TABLET BY MOUTH ONCE DAILY  . torsemide (DEMADEX) 10 MG tablet TAKE 1 TABLET BY MOUTH ONCE DAILY  . warfarin (COUMADIN) 2.5 MG tablet TAKE 1 TABLET BY MOUTH EVERY DAY WITH EVENING MEAL     Allergies:   Gabapentin and Nsaids   Social History   Socioeconomic History  . Marital status: Married    Spouse name: Not on file  . Number of children: Not  on file  . Years of education: Not on file  . Highest education level: Not on file  Occupational History  . Not on file  Social Needs  . Financial resource strain: Not on file  . Food insecurity:    Worry: Not on file    Inability: Not on file  . Transportation needs:    Medical: Not on file    Non-medical: Not on file  Tobacco Use  . Smoking status: Former Research scientist (life sciences)  . Smokeless tobacco: Never Used  Substance and Sexual Activity  . Alcohol use: Not Currently  . Drug use: Not Currently  . Sexual activity: Not on file  Lifestyle  . Physical activity:    Days per week: Not on file    Minutes per  session: Not on file  . Stress: Not on file  Relationships  . Social connections:    Talks on phone: Not on file    Gets together: Not on file    Attends religious service: Not on file    Active member of club or organization: Not on file    Attends meetings of clubs or organizations: Not on file    Relationship status: Not on file  Other Topics Concern  . Not on file  Social History Narrative  . Not on file     Family History: The patient's family history includes Cancer in his sister; Heart disease in his father. ROS:   Please see the history of present illness.    All other systems reviewed and are negative.  EKGs/Labs/Other Studies Reviewed:    The following studies were reviewed today:  EKG:  EKG ordered today.  The ekg ordered today demonstrates underlying atrial fibrillation 100% ventricularly paced by biV  Recent Labs: 06/10/2017: ALT 12 07/01/2017: BUN 18; Creatinine, Ser 1.10; Hemoglobin 14.1; Platelets 201; Potassium 4.4; Sodium 145  Recent Lipid Panel No results found for: CHOL, TRIG, HDL, CHOLHDL, VLDL, LDLCALC, LDLDIRECT  Physical Exam:    VS:  BP 114/68 (BP Location: Right Arm, Patient Position: Sitting, Cuff Size: Normal)   Pulse 83   Ht 6' (1.829 m)   Wt 196 lb 3.2 oz (89 kg)   SpO2 97%   BMI 26.61 kg/m     Wt Readings from Last 3 Encounters:  04/04/18 196 lb 3.2 oz (89 kg)  11/27/17 197 lb 12.8 oz (89.7 kg)  07/01/17 198 lb (89.8 kg)    Standing blood pressure 120/60 GEN:  Well nourished, well developed in no acute distress HEENT: Normal NECK: No JVD; No carotid bruits LYMPHATICS: No lymphadenopathy CARDIAC: Variable first heart sound RRR, no murmurs, rubs, gallops RESPIRATORY:  Clear to auscultation without rales, wheezing or rhonchi  ABDOMEN: Soft, non-tender, non-distended MUSCULOSKELETAL:  No edema; No deformity  SKIN: Warm and dry NEUROLOGIC:  Alert and oriented x 3 PSYCHIATRIC:  Normal affect    Signed, Shirlee More, MD    04/04/2018 11:05 AM    [place]

## 2018-04-04 ENCOUNTER — Ambulatory Visit (INDEPENDENT_AMBULATORY_CARE_PROVIDER_SITE_OTHER): Payer: Medicare Other | Admitting: Cardiology

## 2018-04-04 VITALS — BP 114/68 | HR 83 | Ht 72.0 in | Wt 196.2 lb

## 2018-04-04 DIAGNOSIS — Z950 Presence of cardiac pacemaker: Secondary | ICD-10-CM | POA: Diagnosis not present

## 2018-04-04 DIAGNOSIS — Z9889 Other specified postprocedural states: Secondary | ICD-10-CM

## 2018-04-04 DIAGNOSIS — Z7901 Long term (current) use of anticoagulants: Secondary | ICD-10-CM | POA: Diagnosis not present

## 2018-04-04 DIAGNOSIS — I5032 Chronic diastolic (congestive) heart failure: Secondary | ICD-10-CM

## 2018-04-04 DIAGNOSIS — I482 Chronic atrial fibrillation, unspecified: Secondary | ICD-10-CM | POA: Diagnosis not present

## 2018-04-04 LAB — BASIC METABOLIC PANEL
BUN/Creatinine Ratio: 19 (ref 10–24)
BUN: 23 mg/dL (ref 8–27)
CHLORIDE: 102 mmol/L (ref 96–106)
CO2: 24 mmol/L (ref 20–29)
Calcium: 10.2 mg/dL (ref 8.6–10.2)
Creatinine, Ser: 1.2 mg/dL (ref 0.76–1.27)
GFR calc Af Amer: 63 mL/min/{1.73_m2} (ref >59)
GFR calc non Af Amer: 54 mL/min/{1.73_m2} — ABNORMAL LOW (ref >59)
GLUCOSE: 93 mg/dL (ref 65–99)
POTASSIUM: 4.2 mmol/L (ref 3.5–5.2)
SODIUM: 142 mmol/L (ref 134–144)

## 2018-04-04 LAB — CBC
HEMATOCRIT: 40.8 % (ref 37.5–51.0)
HEMOGLOBIN: 13.9 g/dL (ref 13.0–17.7)
MCH: 29.6 pg (ref 26.6–33.0)
MCHC: 34.1 g/dL (ref 31.5–35.7)
MCV: 87 fL (ref 79–97)
PLATELETS: 163 10*3/uL (ref 150–450)
RBC: 4.7 x10E6/uL (ref 4.14–5.80)
RDW: 15.6 % — AB (ref 12.3–15.4)
WBC: 5.4 10*3/uL (ref 3.4–10.8)

## 2018-04-04 LAB — PRO B NATRIURETIC PEPTIDE: NT-PRO BNP: 680 pg/mL — AB (ref 0–486)

## 2018-04-04 NOTE — Patient Instructions (Signed)
Medication Instructions:  Your physician recommends that you continue on your current medications as directed. Please refer to the Current Medication list given to you today.   Labwork: Your physician recommends that you return for lab work today: CBC, BMP, ProBNP.   Testing/Procedures: You had an EKG today.   Follow-Up: Your physician wants you to follow-up in: 3 months. You will receive a reminder letter in the mail two months in advance. If you don't receive a letter, please call our office to schedule the follow-up appointment.   If you need a refill on your cardiac medications before your next appointment, please call your pharmacy.   Thank you for choosing [facility]! Robyne Peers, RN [phone]

## 2018-05-01 ENCOUNTER — Ambulatory Visit (INDEPENDENT_AMBULATORY_CARE_PROVIDER_SITE_OTHER): Payer: Medicare Other

## 2018-05-01 DIAGNOSIS — Z5181 Encounter for therapeutic drug level monitoring: Secondary | ICD-10-CM

## 2018-05-01 DIAGNOSIS — I482 Chronic atrial fibrillation, unspecified: Secondary | ICD-10-CM

## 2018-05-01 DIAGNOSIS — Z7901 Long term (current) use of anticoagulants: Secondary | ICD-10-CM

## 2018-05-01 LAB — POCT INR: INR: 2.9 (ref 2.0–3.0)

## 2018-05-01 NOTE — Patient Instructions (Signed)
Description   Continue to take 1/2 tablet (1.25 mg) daily except for 1 tablet (2.5 mg) on Friday's. Remain consistent with leafy green vegetables. Recheck INR in 6 weeks. Please call the office with any medication changes or additions [phone].

## 2018-06-10 ENCOUNTER — Ambulatory Visit (INDEPENDENT_AMBULATORY_CARE_PROVIDER_SITE_OTHER): Payer: Medicare Other | Admitting: Pharmacist

## 2018-06-10 DIAGNOSIS — I482 Chronic atrial fibrillation, unspecified: Secondary | ICD-10-CM

## 2018-06-10 DIAGNOSIS — Z7901 Long term (current) use of anticoagulants: Secondary | ICD-10-CM | POA: Diagnosis not present

## 2018-06-10 DIAGNOSIS — Z5181 Encounter for therapeutic drug level monitoring: Secondary | ICD-10-CM

## 2018-06-10 LAB — POCT INR: INR: 2.9 (ref 2.0–3.0)

## 2018-06-10 NOTE — Patient Instructions (Signed)
Description   Continue to take 1/2 tablet (1.25 mg) daily except for 1 tablet (2.5 mg) on Friday's. Remain consistent with leafy green vegetables. Recheck INR in 6 weeks. Please call the office with any medication changes or additions [phone].

## 2018-07-01 ENCOUNTER — Other Ambulatory Visit: Payer: Self-pay | Admitting: Cardiology

## 2018-07-22 ENCOUNTER — Ambulatory Visit (INDEPENDENT_AMBULATORY_CARE_PROVIDER_SITE_OTHER): Payer: Medicare Other | Admitting: *Deleted

## 2018-07-22 DIAGNOSIS — Z5181 Encounter for therapeutic drug level monitoring: Secondary | ICD-10-CM | POA: Diagnosis not present

## 2018-07-22 DIAGNOSIS — I482 Chronic atrial fibrillation, unspecified: Secondary | ICD-10-CM

## 2018-07-22 DIAGNOSIS — Z7901 Long term (current) use of anticoagulants: Secondary | ICD-10-CM

## 2018-07-22 LAB — POCT INR: INR: 3.1 — AB (ref 2.0–3.0)

## 2018-07-22 NOTE — Patient Instructions (Signed)
Description   Continue to take 1/2 tablet (1.25 mg) daily except for 1 tablet (2.5 mg) on Friday's. Remain consistent with leafy green vegetables. Recheck INR in 6 weeks. Please call the office with any medication changes or additions [phone].

## 2018-08-11 ENCOUNTER — Other Ambulatory Visit: Payer: Self-pay | Admitting: Cardiology

## 2018-08-30 ENCOUNTER — Inpatient Hospital Stay (HOSPITAL_COMMUNITY): Payer: Medicare Other

## 2018-08-30 ENCOUNTER — Other Ambulatory Visit: Payer: Self-pay

## 2018-08-30 ENCOUNTER — Encounter (HOSPITAL_COMMUNITY): Payer: Self-pay

## 2018-08-30 ENCOUNTER — Inpatient Hospital Stay (HOSPITAL_COMMUNITY)
Admission: EM | Admit: 2018-08-30 | Discharge: 2018-09-01 | DRG: 439 | Disposition: A | Discharge status: Home / Self Care | Payer: Medicare Other | Attending: Internal Medicine | Admitting: Internal Medicine

## 2018-08-30 DIAGNOSIS — K8500 Idiopathic acute pancreatitis without necrosis or infection: Principal | ICD-10-CM | POA: Diagnosis present

## 2018-08-30 DIAGNOSIS — Z886 Allergy status to analgesic agent status: Secondary | ICD-10-CM | POA: Diagnosis not present

## 2018-08-30 DIAGNOSIS — J9610 Chronic respiratory failure, unspecified whether with hypoxia or hypercapnia: Secondary | ICD-10-CM | POA: Diagnosis present

## 2018-08-30 DIAGNOSIS — Z923 Personal history of irradiation: Secondary | ICD-10-CM | POA: Diagnosis not present

## 2018-08-30 DIAGNOSIS — I2510 Atherosclerotic heart disease of native coronary artery without angina pectoris: Secondary | ICD-10-CM | POA: Diagnosis present

## 2018-08-30 DIAGNOSIS — J449 Chronic obstructive pulmonary disease, unspecified: Secondary | ICD-10-CM | POA: Diagnosis present

## 2018-08-30 DIAGNOSIS — K859 Acute pancreatitis without necrosis or infection, unspecified: Secondary | ICD-10-CM

## 2018-08-30 DIAGNOSIS — C3491 Malignant neoplasm of unspecified part of right bronchus or lung: Secondary | ICD-10-CM | POA: Diagnosis present

## 2018-08-30 DIAGNOSIS — J8410 Pulmonary fibrosis, unspecified: Secondary | ICD-10-CM | POA: Diagnosis present

## 2018-08-30 DIAGNOSIS — I482 Chronic atrial fibrillation, unspecified: Secondary | ICD-10-CM | POA: Diagnosis present

## 2018-08-30 DIAGNOSIS — Z950 Presence of cardiac pacemaker: Secondary | ICD-10-CM

## 2018-08-30 DIAGNOSIS — G4733 Obstructive sleep apnea (adult) (pediatric): Secondary | ICD-10-CM | POA: Diagnosis present

## 2018-08-30 DIAGNOSIS — Z86711 Personal history of pulmonary embolism: Secondary | ICD-10-CM

## 2018-08-30 DIAGNOSIS — Z79899 Other long term (current) drug therapy: Secondary | ICD-10-CM

## 2018-08-30 DIAGNOSIS — Z9981 Dependence on supplemental oxygen: Secondary | ICD-10-CM | POA: Diagnosis not present

## 2018-08-30 DIAGNOSIS — E785 Hyperlipidemia, unspecified: Secondary | ICD-10-CM | POA: Diagnosis present

## 2018-08-30 DIAGNOSIS — Z902 Acquired absence of lung [part of]: Secondary | ICD-10-CM | POA: Diagnosis not present

## 2018-08-30 DIAGNOSIS — E782 Mixed hyperlipidemia: Secondary | ICD-10-CM | POA: Diagnosis not present

## 2018-08-30 DIAGNOSIS — I2699 Other pulmonary embolism without acute cor pulmonale: Secondary | ICD-10-CM | POA: Diagnosis present

## 2018-08-30 DIAGNOSIS — R112 Nausea with vomiting, unspecified: Secondary | ICD-10-CM | POA: Diagnosis not present

## 2018-08-30 DIAGNOSIS — Z7901 Long term (current) use of anticoagulants: Secondary | ICD-10-CM

## 2018-08-30 DIAGNOSIS — Z8249 Family history of ischemic heart disease and other diseases of the circulatory system: Secondary | ICD-10-CM | POA: Diagnosis not present

## 2018-08-30 DIAGNOSIS — Z86718 Personal history of other venous thrombosis and embolism: Secondary | ICD-10-CM | POA: Diagnosis not present

## 2018-08-30 DIAGNOSIS — K861 Other chronic pancreatitis: Secondary | ICD-10-CM | POA: Diagnosis present

## 2018-08-30 DIAGNOSIS — Z87891 Personal history of nicotine dependence: Secondary | ICD-10-CM

## 2018-08-30 DIAGNOSIS — C3412 Malignant neoplasm of upper lobe, left bronchus or lung: Secondary | ICD-10-CM | POA: Diagnosis present

## 2018-08-30 DIAGNOSIS — K219 Gastro-esophageal reflux disease without esophagitis: Secondary | ICD-10-CM | POA: Diagnosis present

## 2018-08-30 DIAGNOSIS — I5032 Chronic diastolic (congestive) heart failure: Secondary | ICD-10-CM | POA: Diagnosis present

## 2018-08-30 DIAGNOSIS — Z888 Allergy status to other drugs, medicaments and biological substances status: Secondary | ICD-10-CM

## 2018-08-30 LAB — URINALYSIS, ROUTINE W REFLEX MICROSCOPIC
Bacteria, UA: NONE SEEN
Bilirubin Urine: NEGATIVE
Glucose, UA: NEGATIVE mg/dL
Hgb urine dipstick: NEGATIVE
Ketones, ur: 5 mg/dL — AB
Leukocytes,Ua: NEGATIVE
Nitrite: NEGATIVE
Protein, ur: 100 mg/dL — AB
Specific Gravity, Urine: 1.021 (ref 1.005–1.030)
pH: 6 (ref 5.0–8.0)

## 2018-08-30 LAB — LIPASE, BLOOD: Lipase: 1311 U/L — ABNORMAL HIGH (ref 11–51)

## 2018-08-30 LAB — COMPREHENSIVE METABOLIC PANEL
ALBUMIN: 4.3 g/dL (ref 3.5–5.0)
ALT: 14 U/L (ref 0–44)
AST: 25 U/L (ref 15–41)
Alkaline Phosphatase: 117 U/L (ref 38–126)
Anion gap: 7 (ref 5–15)
BUN: 20 mg/dL (ref 8–23)
CO2: 27 mmol/L (ref 22–32)
Calcium: 9.7 mg/dL (ref 8.9–10.3)
Chloride: 108 mmol/L (ref 98–111)
Creatinine, Ser: 1.07 mg/dL (ref 0.61–1.24)
GFR calc Af Amer: >60 mL/min (ref >60)
GFR calc non Af Amer: >60 mL/min (ref >60)
Glucose, Bld: 118 mg/dL — ABNORMAL HIGH (ref 70–99)
Potassium: 4.2 mmol/L (ref 3.5–5.1)
SODIUM: 142 mmol/L (ref 135–145)
Total Bilirubin: 1 mg/dL (ref 0.3–1.2)
Total Protein: 6.9 g/dL (ref 6.5–8.1)

## 2018-08-30 LAB — CBC
HCT: 46 % (ref 39.0–52.0)
Hemoglobin: 14.3 g/dL (ref 13.0–17.0)
MCH: 29.3 pg (ref 26.0–34.0)
MCHC: 31.1 g/dL (ref 30.0–36.0)
MCV: 94.3 fL (ref 80.0–100.0)
Platelets: 161 10*3/uL (ref 150–400)
RBC: 4.88 MIL/uL (ref 4.22–5.81)
RDW: 13.8 % (ref 11.5–15.5)
WBC: 8 10*3/uL (ref 4.0–10.5)
nRBC: 0 % (ref 0.0–0.2)

## 2018-08-30 LAB — LIPID PANEL
Cholesterol: 118 mg/dL (ref 0–200)
HDL: 46 mg/dL (ref >40)
LDL Cholesterol: 62 mg/dL (ref 0–99)
Total CHOL/HDL Ratio: 2.6 RATIO
Triglycerides: 49 mg/dL (ref <150)
VLDL: 10 mg/dL (ref 0–40)

## 2018-08-30 LAB — PROTIME-INR
INR: 2.8 — ABNORMAL HIGH (ref 0.8–1.2)
PROTHROMBIN TIME: 28.7 s — AB (ref 11.4–15.2)

## 2018-08-30 MED ORDER — SODIUM CHLORIDE 0.9 % IV BOLUS
500.0000 mL | Freq: Once | INTRAVENOUS | Status: AC
  Administered 2018-08-30: 500 mL via INTRAVENOUS

## 2018-08-30 MED ORDER — SODIUM CHLORIDE 0.9 % IV BOLUS
1000.0000 mL | INTRAVENOUS | Status: AC
  Administered 2018-08-30 (×2): 1000 mL via INTRAVENOUS

## 2018-08-30 MED ORDER — HYDROMORPHONE HCL 1 MG/ML IJ SOLN
0.5000 mg | Freq: Once | INTRAMUSCULAR | Status: AC
  Administered 2018-08-30: 0.5 mg via INTRAVENOUS
  Filled 2018-08-30: qty 1

## 2018-08-30 MED ORDER — ONDANSETRON 4 MG PO TBDP
4.0000 mg | ORAL_TABLET | Freq: Once | ORAL | Status: AC | PRN
  Administered 2018-08-30: 4 mg via ORAL
  Filled 2018-08-30: qty 1

## 2018-08-30 MED ORDER — LACTATED RINGERS IV SOLN
INTRAVENOUS | Status: DC
  Administered 2018-08-30 – 2018-08-31 (×2): via INTRAVENOUS

## 2018-08-30 MED ORDER — HYDROMORPHONE HCL 1 MG/ML IJ SOLN
0.5000 mg | INTRAMUSCULAR | Status: DC | PRN
  Administered 2018-08-30 – 2018-09-01 (×9): 0.5 mg via INTRAVENOUS
  Filled 2018-08-30 (×10): qty 1

## 2018-08-30 MED ORDER — SODIUM CHLORIDE 0.9% FLUSH
3.0000 mL | Freq: Once | INTRAVENOUS | Status: AC
  Administered 2018-08-30: 3 mL via INTRAVENOUS

## 2018-08-30 MED ORDER — FAMOTIDINE IN NACL 20-0.9 MG/50ML-% IV SOLN
20.0000 mg | INTRAVENOUS | Status: DC
  Administered 2018-08-30 – 2018-08-31 (×2): 20 mg via INTRAVENOUS
  Filled 2018-08-30 (×2): qty 50

## 2018-08-30 MED ORDER — ONDANSETRON HCL 4 MG/2ML IJ SOLN
4.0000 mg | Freq: Once | INTRAMUSCULAR | Status: AC
  Administered 2018-08-30: 4 mg via INTRAVENOUS
  Filled 2018-08-30: qty 2

## 2018-08-30 MED ORDER — ONDANSETRON HCL 4 MG PO TABS
4.0000 mg | ORAL_TABLET | Freq: Four times a day (QID) | ORAL | Status: DC | PRN
  Administered 2018-09-01: 4 mg via ORAL
  Filled 2018-08-30: qty 1

## 2018-08-30 MED ORDER — ONDANSETRON HCL 4 MG/2ML IJ SOLN
4.0000 mg | Freq: Four times a day (QID) | INTRAMUSCULAR | Status: DC | PRN
  Administered 2018-08-31 (×2): 4 mg via INTRAVENOUS
  Filled 2018-08-30 (×3): qty 2

## 2018-08-30 NOTE — ED Notes (Signed)
US at bedside

## 2018-08-30 NOTE — ED Notes (Signed)
Pt placed on 3L of O2 via nasal cannula, RA sat dropped to 88%. Per wife: PT wears O2 at night when sleeping.

## 2018-08-30 NOTE — ED Triage Notes (Signed)
Patient c/o generalized abdominal pain, nausea, and lower back pain since last night. Patient has a history of pancreatitis.

## 2018-08-30 NOTE — H&P (Signed)
[facility] History and Physical   Patient: Jerome Newman WJX:[id number]   PCP: [name]., MD DOB: 02-07-1930   DOA: 08/30/2018   DOS: 08/30/2018   DOS: the patient was seen and examined on 08/30/2018  Patient coming from: The patient is coming from home.  Chief Complaint: Abdominal pain  HPI: Jerome Newman is a 85 y.o. male with Past medical history of recurrent pancreatitis, GERD, BPH, CAD, pacemaker implant, chronic diastolic CHF,Lung cancer S/P lobectomy and radiation,  lung fibrosis, OSA with chronic respiratory failure on nocturnal oxygen, BPH. Patient presents with complaints of abdominal pain. Patient reports that he was at his baseline last night.  When he woke up in the middle of the night with complaints of severe abdominal pain located epigastric region but otherwise diffuse.  Associated with nausea and vomiting.  Denies any diarrhea or constipation.  No fever no chills reported.  This is reportedly his fourth episodes of pancreatitis. Per patient he follows up with a GI attending in the past and they have not been able to figure out the reason for his frequent pancreatitis episodes. Does not have any abdominal pain at his baseline to suggest chronic pancreatitis Denies any alcohol abuse.  Is losing weight.  No drug abuse reported as well.  No recent change in medications reported as well.  ED Course: Presents with abdominal pain.  LFT were normal.  Lipase was elevated.  Patient was referred for admission.  At his baseline ambulates without any assistance And is independent for most of his ADL; manages his medication on his own.  Review of Systems: as mentioned in the history of present illness.  All other systems reviewed and are negative.  Past Medical History:  Diagnosis Date  . BPH (benign prostatic hyperplasia)   . GERD (gastroesophageal reflux disease)   . Pancreatitis   . Pneumonia    Past Surgical History:  Procedure Laterality Date  . AV NODE ABLATION   01/18/2016  . BI-VENTRICULAR PACEMAKER INSERTION (CRT-P)    . CARDIOVERSION    . HERNIA REPAIR    . LUNG REMOVAL, PARTIAL Right    Social History:  reports that he has quit smoking. He has never used smokeless tobacco. He reports previous alcohol use. He reports previous drug use.  Allergies  Allergen Reactions  . Gabapentin Other (See Comments)    drowsiness  . Nsaids     NOT SUPPOSED TO TAKE DUE TO BLOOD THINER MEDS    Family History  Problem Relation Age of Onset  . Heart disease Father   . Cancer Sister      Prior to Admission medications   Medication Sig Start Date End Date Taking? Authorizing Provider  acetaminophen (TYLENOL) 500 MG tablet Take 1,000 mg by mouth 3 (three) times daily.    Yes [provider]  atorvastatin (LIPITOR) 40 MG tablet Take 1 tablet (40 mg total) by mouth daily at 6 PM. 12/03/17  Yes Munley, Hilton Cork, MD  pantoprazole (PROTONIX) 40 MG tablet Take 40 mg by mouth 2 (two) times daily. 12/27/16  Yes [provider]  potassium chloride SA (K-DUR,KLOR-CON) 20 MEQ tablet TAKE 1 TABLET BY MOUTH ONCE DAILY 01/29/18  Yes Richardo Priest, MD  torsemide (DEMADEX) 10 MG tablet TAKE 1 TABLET BY MOUTH ONCE DAILY 08/11/18  Yes Richardo Priest, MD  warfarin (COUMADIN) 2.5 MG tablet Take 1 tablet (2.5 mg total) by mouth as directed. Patient taking differently: Take 1.25-2.5 mg by mouth as directed. Take 0.5 tablet (  1.25 mg) daily except on Friday Take 1 tablet (2.5 mg) 07/03/18  Yes Richardo Priest, MD    Physical Exam: Vitals:   08/30/18 1311 08/30/18 1507 08/30/18 1600 08/30/18 1634  BP: 122/66 (!) 142/70 (!) 153/74 (!) 180/89  Pulse: 85 81 81 80  Resp: 18 16 18 18   Temp: 98.6 F (37 C)   97.8 F (36.6 C)  TempSrc: Oral   Oral  SpO2: 99% 95% 94% 99%  Weight: 88.5 kg   89.8 kg  Height: 6' (1.829 m)   6' (1.829 m)    General: Alert, Awake and Oriented to Time, Place and Person. Appear in moderate distress, affect appropriate Eyes: PERRL,  Conjunctiva normal ENT: Orl Mucosa clear dry Neck: no JVD, no Abnormal Mass Or lumps Cardiovascular: S1 and S2 Present, no Murmur, Peripheral Pulses Present Respiratory: normal respiratory effort, Bilateral Air entry equal and Decreased, no use of accessory muscle, Clear to Auscultation, no Crackles, no wheezes Abdomen: Bowel Sound present, Soft and diffuse moderate tenderness, no hernia Skin: no redness, no Rash, no induration Extremities: no Pedal edema, no calf tenderness Neurologic: Grossly no focal neuro deficit. Bilaterally Equal motor strength  Labs on Admission:  CBC: Recent Labs  Lab 08/30/18 1332  WBC 8.0  HGB 14.3  HCT 46.0  MCV 94.3  PLT 330   Basic Metabolic Panel: Recent Labs  Lab 08/30/18 1332  NA 142  K 4.2  CL 108  CO2 27  GLUCOSE 118*  BUN 20  CREATININE 1.07  CALCIUM 9.7   GFR: Estimated Creatinine Clearance: 54.4 mL/min (by C-G formula based on SCr of 1.07 mg/dL). Liver Function Tests: Recent Labs  Lab 08/30/18 1332  AST 25  ALT 14  ALKPHOS 117  BILITOT 1.0  PROT 6.9  ALBUMIN 4.3   Recent Labs  Lab 08/30/18 1332  LIPASE 1,311*   No results for input(s): AMMONIA in the last 168 hours. Coagulation Profile: Recent Labs  Lab 08/30/18 1451  INR 2.8*   Cardiac Enzymes: No results for input(s): CKTOTAL, CKMB, CKMBINDEX, TROPONINI in the last 168 hours. BNP (last 3 results) Recent Labs    04/04/18 1116  PROBNP 680*   HbA1C: No results for input(s): HGBA1C in the last 72 hours. CBG: No results for input(s): GLUCAP in the last 168 hours. Lipid Profile: Recent Labs    08/30/18 1718  CHOL 118  HDL 46  LDLCALC 62  TRIG 49  CHOLHDL 2.6   Thyroid Function Tests: No results for input(s): TSH, T4TOTAL, FREET4, T3FREE, THYROIDAB in the last 72 hours. Anemia Panel: No results for input(s): VITAMINB12, FOLATE, FERRITIN, TIBC, IRON, RETICCTPCT in the last 72 hours. Urine analysis:    Component Value Date/Time   COLORURINE YELLOW  06/10/2017 1847   APPEARANCEUR CLEAR 06/10/2017 1847   LABSPEC 1.024 06/10/2017 1847   PHURINE 5.0 06/10/2017 1847   GLUCOSEU NEGATIVE 06/10/2017 1847   HGBUR NEGATIVE 06/10/2017 1847   BILIRUBINUR NEGATIVE 06/10/2017 1847   KETONESUR 5 (A) 06/10/2017 1847   PROTEINUR 100 (A) 06/10/2017 1847   NITRITE NEGATIVE 06/10/2017 1847   LEUKOCYTESUR NEGATIVE 06/10/2017 1847    Radiological Exams on Admission: US Abdomen Limited Ruq  Result Date: 08/30/2018 CLINICAL DATA:  Known pancreatitis and generalized abdominal pain EXAM: ULTRASOUND ABDOMEN LIMITED RIGHT UPPER QUADRANT COMPARISON:  None. FINDINGS: Gallbladder: No gallstones or wall thickening visualized. No sonographic Murphy sign noted by sonographer. Common bile duct: Diameter: 4 mm Liver: Evaluation of the liver is extremely limited due to patient body  habitus and overlying bowel gas. Visualized areas show increased echogenicity likely related to fatty infiltration. No other significant information can be gleaned. Portal vein is patent on color Doppler imaging with normal direction of blood flow towards the liver. IMPRESSION: Severely limited exam although no acute abnormality is noted. Electronically Signed   By: Inez Catalina M.D.   On: 08/30/2018 16:22   Assessment/Plan 1.  Acute recurrent pancreatitis. Patient reports that he has so far for episodes of pancreatitis. Last episode was 3 years ago. Denies any alcohol abuse. Triglyceride are normal.  Patient reports that he has been seen by GI physician outpatient and they have not been able to figure out the reason for his pancreatitis. Lipase is mildly elevated patient is exquisitely tender on examination but LFTs are stable but We will start with ultrasound of the abdomen to ensure there is no cholelithiasis or choledocholithiasis. May require further work-up depending on the finding. Has not received adequate hydration, we will continue with IV hydration, keep the patient n.p.o.  provide IV antiemetics as well as IV Dilaudid for pain control. Monitor in and out.  2.  History of CAD. History of chronic diastolic CHF. S/P pacemaker implantation. HLD. Currently holding all his medication, which includes Lipitor, torsemide.  3.  History of DVT. Patient is on warfarin at home. Currently on hold secondary to acute pancreatitis. INR is therapeutic, and if the pancreatitis does not resolve, may require IV heparin or Lovenox for DVT prophylaxis.  Nutrition: npo DVT Prophylaxis: on therapeutic anticoagulation.  Advance goals of care discussion: full code   Consults: none  Family Communication: family was present at bedside, at the time of interview.  Opportunity was given to ask question and all questions were answered satisfactorily.  Disposition: Admitted as inpatient, med-surge unit. Likely to be discharged home, in 3-4 days.  Author: Berle Mull, MD [facility] 08/30/2018  To reach On-call, see care teams to locate the attending and reach out to them via [email or web address]. If 7PM-7AM, please contact night-coverage If you still have difficulty reaching the attending provider, please page the [facility] (Director on Call) for [facility] on amion for assistance.

## 2018-08-30 NOTE — ED Notes (Signed)
ED TO INPATIENT HANDOFF REPORT  Name/Age/Gender Jerome Newman 85 y.o. male  Code Status    Code Status Orders  (From admission, onward)         Start     Ordered   08/30/18 1541  Full code  Continuous     08/30/18 1542        Code Status History    This patient has a current code status but no historical code status.    Advance Directive Documentation     Most Recent Value  Type of Advance Directive  Healthcare Power of Attorney, Living will  Pre-existing out of facility DNR order (yellow form or pink MOST form)  -  "MOST" Form in Place?  -      Home/SNF/Other Home  Chief Complaint severe abd/back pain,nausea  Level of Care/Admitting Diagnosis ED Disposition    ED Disposition Condition [place]: [place] [100102]  Level of Care: Med-Surg [16]  Diagnosis: Acute pancreatitis [577.0.ICD-9-CM]  Admitting Physician: Lavina Hamman [[id number]]  Attending Physician: Lavina Hamman [[id number]]  Estimated length of stay: past midnight tomorrow  Certification:: I certify this patient will need inpatient services for at least 2 midnights  PT Class (Do Not Modify): Inpatient [101]  PT Acc Code (Do Not Modify): Private [1]       Medical History Past Medical History:  Diagnosis Date  . BPH (benign prostatic hyperplasia)   . GERD (gastroesophageal reflux disease)   . Pancreatitis   . Pneumonia     Allergies Allergies  Allergen Reactions  . Gabapentin Other (See Comments)    drowsiness  . Nsaids     NOT SUPPOSED TO TAKE DUE TO BLOOD THINER MEDS    IV Location/Drains/Wounds Patient Lines/Drains/Airways Status   Active Line/Drains/Airways    Name:   Placement date:   Placement time:   Site:   Days:   Peripheral IV 08/30/18 Left Antecubital   08/30/18    1503    Antecubital   less than 1          Labs/Imaging Results for orders placed or performed during the hospital encounter of 08/30/18 (from the past 48  hour(s))  Lipase, blood     Status: Abnormal   Collection Time: 08/30/18  1:32 PM  Result Value Ref Range   Lipase 1,311 (H) 11 - 51 U/L    Comment: RESULTS CONFIRMED BY MANUAL DILUTION Performed at [facility] [address] [place], [place] [address]   Comprehensive metabolic panel     Status: Abnormal   Collection Time: 08/30/18  1:32 PM  Result Value Ref Range   Sodium 142 135 - 145 mmol/L   Potassium 4.2 3.5 - 5.1 mmol/L   Chloride 108 98 - 111 mmol/L   CO2 27 22 - 32 mmol/L   Glucose, Bld 118 (H) 70 - 99 mg/dL   BUN 20 8 - 23 mg/dL   Creatinine, Ser 1.07 0.61 - 1.24 mg/dL   Calcium 9.7 8.9 - 10.3 mg/dL   Total Protein 6.9 6.5 - 8.1 g/dL   Albumin 4.3 3.5 - 5.0 g/dL   AST 25 15 - 41 U/L   ALT 14 0 - 44 U/L   Alkaline Phosphatase 117 38 - 126 U/L   Total Bilirubin 1.0 0.3 - 1.2 mg/dL   GFR calc non Af Amer >60 >60 mL/min   GFR calc Af Amer >60 >60 mL/min   Anion gap 7 5 -  15    Comment: Performed at [facility], [place] [address], [place], [place] [address]  CBC     Status: None   Collection Time: 08/30/18  1:32 PM  Result Value Ref Range   WBC 8.0 4.0 - 10.5 K/uL   RBC 4.88 4.22 - 5.81 MIL/uL   Hemoglobin 14.3 13.0 - 17.0 g/dL   HCT 46.0 39.0 - 52.0 %   MCV 94.3 80.0 - 100.0 fL   MCH 29.3 26.0 - 34.0 pg   MCHC 31.1 30.0 - 36.0 g/dL   RDW 13.8 11.5 - 15.5 %   Platelets 161 150 - 400 K/uL   nRBC 0.0 0.0 - 0.2 %    Comment: Performed at [facility], [place] [address], [place], [place] [address]   No results found.  Pending Labs Unresulted Labs (From admission, onward)    Start     Ordered   08/30/18 1537  Lipid panel  Once,   R     08/30/18 1537   08/30/18 1451  Protime-INR  Once,   STAT     08/30/18 1450   08/30/18 1315  Urinalysis, Routine w reflex microscopic  ONCE - STAT,   STAT     08/30/18 1315          Vitals/Pain Today's Vitals   08/30/18 1311 08/30/18 1507  BP: 122/66 (!) 142/70  Pulse:  85 81  Resp: 18 16  Temp: 98.6 F (37 C)   TempSrc: Oral   SpO2: 99% 95%  Weight: 88.5 kg   Height: 6' (1.829 m)   PainSc: 5  10-Worst pain ever    Isolation Precautions No active isolations  Medications Medications  sodium chloride 0.9 % bolus 1,000 mL (has no administration in time range)  lactated ringers infusion (has no administration in time range)  famotidine (PEPCID) IVPB 20 mg premix (has no administration in time range)  ondansetron (ZOFRAN) tablet 4 mg (has no administration in time range)    Or  ondansetron (ZOFRAN) injection 4 mg (has no administration in time range)  HYDROmorphone (DILAUDID) injection 0.5 mg (has no administration in time range)  sodium chloride flush (NS) 0.9 % injection 3 mL (3 mLs Intravenous Given 08/30/18 1504)  ondansetron (ZOFRAN-ODT) disintegrating tablet 4 mg (4 mg Oral Given 08/30/18 1317)  sodium chloride 0.9 % bolus 500 mL (500 mLs Intravenous Bolus 08/30/18 1505)  HYDROmorphone (DILAUDID) injection 0.5 mg (0.5 mg Intravenous Given 08/30/18 1504)  ondansetron (ZOFRAN) injection 4 mg (4 mg Intravenous Given 08/30/18 1504)    Mobility walks with device

## 2018-08-30 NOTE — ED Provider Notes (Signed)
[name] DEPT Provider Note   CSN: [id number] Arrival date & time: 08/30/18  1305    History   Chief Complaint Chief Complaint  Patient presents with  . Nausea  . Abdominal Pain  . Back Pain    HPI Jerome Newman is a 85 y.o. male.     HPI Patient presents with nausea vomiting abdominal pain.  Pain is in the mid back.  History of pancreatitis.  Last episode was around 2 years ago that would happen frequently before that.  Per family patient does not have a known cause.  Reported previous negative CT scans and ultrasounds in terms of cause.  Does not drink alcohol.  Began with pain last night.  Nausea without vomiting.  No fevers.  On Coumadin for atrial fibrillation.  Previously worked up for the pancreatitis in [place]. Past Medical History:  Diagnosis Date  . BPH (benign prostatic hyperplasia)   . GERD (gastroesophageal reflux disease)   . Pancreatitis   . Pneumonia     Patient Active Problem List   Diagnosis Date Noted  . Encounter for therapeutic drug monitoring 01/03/2017  . Diastolic congestive heart failure ([place]) 12/31/2016  . Hyperlipidemia 12/31/2016  . OSA (obstructive sleep apnea) 12/31/2016  . Oxygen dependent 12/31/2016  . Chronic anticoagulation 12/31/2016  . Chronic atrial fibrillation 12/31/2016  . Lung fibrosis ([place]) 06/01/2016  . Status post biventricular pacemaker 01/18/2016  . S/P AV nodal ablation 01/18/2016  . CAD (coronary artery disease) 09/28/2015  . COPD, mild ([place]) 09/28/2015  . Malignant neoplasm of upper lobe of left lung ([place]) 02/28/2015  . DVT (deep venous thrombosis) ([place]) 09/26/2013  . Pulmonary embolism ([place]) 08/10/2013    Past Surgical History:  Procedure Laterality Date  . AV NODE ABLATION  01/18/2016  . BI-VENTRICULAR PACEMAKER INSERTION (CRT-P)    . CARDIOVERSION    . HERNIA REPAIR    . LUNG REMOVAL, PARTIAL Right         Home Medications    Prior to Admission medications     Medication Sig Start Date End Date Taking? Authorizing Provider  acetaminophen (TYLENOL) 500 MG tablet Take 500 mg by mouth 2 (two) times daily.    [provider]  atorvastatin (LIPITOR) 40 MG tablet Take 1 tablet (40 mg total) by mouth daily at 6 PM. 12/03/17   Bettina Gavia, Hilton Cork, MD  pantoprazole (PROTONIX) 40 MG tablet Take 40 mg by mouth 2 (two) times daily. 12/27/16   [provider]  potassium chloride SA (K-DUR,KLOR-CON) 20 MEQ tablet TAKE 1 TABLET BY MOUTH ONCE DAILY 01/29/18   Richardo Priest, MD  torsemide (DEMADEX) 10 MG tablet TAKE 1 TABLET BY MOUTH ONCE DAILY 08/11/18   Richardo Priest, MD  warfarin (COUMADIN) 2.5 MG tablet Take 1 tablet (2.5 mg total) by mouth as directed. 07/03/18   Richardo Priest, MD    Family History Family History  Problem Relation Age of Onset  . Heart disease Father   . Cancer Sister     Social History Social History   Tobacco Use  . Smoking status: Former Research scientist (life sciences)  . Smokeless tobacco: Never Used  Substance Use Topics  . Alcohol use: Not Currently  . Drug use: Not Currently     Allergies   Gabapentin and Nsaids   Review of Systems Review of Systems  Constitutional: Positive for appetite change.  HENT: Negative for congestion.   Respiratory: Negative for shortness of breath.   Cardiovascular: Negative for chest pain.  Gastrointestinal: Positive for abdominal pain and nausea. Negative for vomiting.  Genitourinary: Negative for flank pain.  Musculoskeletal: Negative for back pain.  Skin: Negative for rash.  Neurological: Negative for weakness.     Physical Exam Updated Vital Signs BP 122/66 (BP Location: Left Arm)   Pulse 85   Temp 98.6 F (37 C) (Oral)   Resp 18   Ht 6' (1.829 m)   Wt 88.5 kg   SpO2 99%   BMI 26.45 kg/m   Physical Exam Constitutional:      Comments: Patient appears uncomfortable  HENT:     Head: Atraumatic.  Cardiovascular:     Rate and Rhythm: Normal rate.  Pulmonary:     Breath sounds:  No wheezing or rhonchi.  Abdominal:     Comments: Somewhat diffuse tenderness but worse in the left upper abdomen.  No rebound or guarding.  No hernia palpated.  Skin:    General: Skin is warm.     Capillary Refill: Capillary refill takes less than 2 seconds.  Neurological:     Mental Status: He is alert and oriented to person, place, and time.      ED Treatments / Results  Labs (all labs ordered are listed, but only abnormal results are displayed) Labs Reviewed  LIPASE, BLOOD - Abnormal; Notable for the following components:      Result Value   Lipase 1,311 (*)    All other components within normal limits  COMPREHENSIVE METABOLIC PANEL - Abnormal; Notable for the following components:   Glucose, Bld 118 (*)    All other components within normal limits  CBC  URINALYSIS, ROUTINE W REFLEX MICROSCOPIC  PROTIME-INR    EKG None  Radiology No results found.  Procedures Procedures (including critical care time)  Medications Ordered in ED Medications  sodium chloride flush (NS) 0.9 % injection 3 mL (has no administration in time range)  sodium chloride 0.9 % bolus 500 mL (has no administration in time range)  HYDROmorphone (DILAUDID) injection 0.5 mg (has no administration in time range)  ondansetron (ZOFRAN) injection 4 mg (has no administration in time range)  ondansetron (ZOFRAN-ODT) disintegrating tablet 4 mg (4 mg Oral Given 08/30/18 1317)     Initial Impression / Assessment and Plan / ED Course  I have reviewed the triage vital signs and the nursing notes.  Pertinent labs & imaging results that were available during my care of the patient were reviewed by me and considered in my medical decision making (see chart for details).        Patient pancreatitis.  Previous history of same.  No clear cause.  Lipase is 1300 but otherwise labs reassuring.  Believe patient will require admission for pain control and IV fluids.  Will admit to hospitalist.  Final Clinical  Impressions(s) / ED Diagnoses   Final diagnoses:  Acute pancreatitis, unspecified complication status, unspecified pancreatitis type    ED Discharge Orders    None       Davonna Belling, MD 08/30/18 1453

## 2018-08-30 NOTE — ED Notes (Signed)
Attempted to call report to 6E. RN unable to take report. RN to call back in 10 mins.

## 2018-08-31 DIAGNOSIS — J449 Chronic obstructive pulmonary disease, unspecified: Secondary | ICD-10-CM

## 2018-08-31 DIAGNOSIS — E782 Mixed hyperlipidemia: Secondary | ICD-10-CM

## 2018-08-31 DIAGNOSIS — I2510 Atherosclerotic heart disease of native coronary artery without angina pectoris: Secondary | ICD-10-CM

## 2018-08-31 LAB — LIPASE, BLOOD: Lipase: 353 U/L — ABNORMAL HIGH (ref 11–51)

## 2018-08-31 LAB — BASIC METABOLIC PANEL
Anion gap: 3 — ABNORMAL LOW (ref 5–15)
BUN: 18 mg/dL (ref 8–23)
CO2: 26 mmol/L (ref 22–32)
CREATININE: 0.93 mg/dL (ref 0.61–1.24)
Calcium: 8.9 mg/dL (ref 8.9–10.3)
Chloride: 112 mmol/L — ABNORMAL HIGH (ref 98–111)
Glucose, Bld: 95 mg/dL (ref 70–99)
Potassium: 4 mmol/L (ref 3.5–5.1)
Sodium: 141 mmol/L (ref 135–145)

## 2018-08-31 MED ORDER — WARFARIN SODIUM 1 MG PO TABS
1.0000 mg | ORAL_TABLET | Freq: Once | ORAL | Status: AC
  Administered 2018-08-31: 1 mg via ORAL
  Filled 2018-08-31: qty 1

## 2018-08-31 MED ORDER — WARFARIN - PHARMACIST DOSING INPATIENT: Freq: Every day | Status: DC

## 2018-08-31 NOTE — Progress Notes (Addendum)
[facility]                                                                              Patient Demographics  Jerome Newman, is a 85 y.o. male, DOB - 06-29-1930, JQB:[id number]  Admit date - 08/30/2018   Admitting Physician Lavina Hamman, MD  Outpatient Primary MD for the patient is [place], Marshall Cork., MD  Outpatient specialists:   LOS - 1  days   Medical records reviewed and are as summarized below:    Chief Complaint  Patient presents with  . Nausea  . Abdominal Pain  . Back Pain       Brief summary   Patient is 85 year old male with history of recurrent pancreatitis, GERD, BPH, CAD, chronic diastolic CHF, lung CA status post lobectomy, radiation, lung fibrosis, OSA with chronic respiratory failure on nocturnal O2, BPH presented with abdominal pain, epigastric, nausea and vomiting.  Patient was found to have pancreatitis, per patient, this is her fourth episode of pancreatitis, has been following up with his GI in [place] and work-up in the past had not determine the etiology of recurrent pancreatitis.  Denies any alcohol use.   Assessment & Plan    Principal Problem:   Acute pancreatitis, recurrent episodes, idiopathic -Follows Dr. Lyda Jester in [place], gastroenterology, headache extensive work-up in the past with no etiology found -No alcohol use, no hypertriglyceridemia, no gallstones or choledocholithiasis on ultrasound. -Will obtain HIDA scan (per wife has not been done in the past). -Lipase 1311 at the time of admission, now improved to 353, LFTs normal -Continue IV fluids, start on clear liquid diet, n.p.o. after midnight for HIDA scan in a.m.  Active Problems:   CAD (coronary artery disease) -Currently no acute issues, hold oral meds including Lipitor, torsemide  Chronic diastolic CHF, status post pacemaker implantation -Hold torsemide, currently euvolemic  History of PE, DVT  -INR still therapeutic 2.8 -Start on clear liquid diet,  warfarin per pharmacy    Hyperlipidemia -Lipitor until tolerating diet    Malignant neoplasm of upper lobe of left lung, squamous cell carcinoma right lung (HCC) -Outpatient follow-up with radiation oncology    COPD, mild ([place]) -Currently stable, no wheezing    Chronic atrial fibrillation -INR currently therapeutic 2.8, warfarin per pharmacy  Code Status: Full CODE STATUS DVT Prophylaxis: INR therapeutic Family Communication: Discussed in detail with the patient, all imaging results, lab results explained to the patient and wife at the bedside   Disposition Plan:   Time Spent in minutes 35 minutes  Procedures:  Ultrasound abdomen  Consultants:   None**  Antimicrobials:   Anti-infectives (From admission, onward)   None         Medications  Scheduled Meds: Continuous Infusions: . famotidine (PEPCID) IV 20 mg (08/30/18 1705)  . lactated ringers 125 mL/hr at 08/31/18 0600   PRN Meds:.HYDROmorphone (DILAUDID) injection, ondansetron **OR** ondansetron (ZOFRAN) IV      Subjective:   Corrigan Kretschmer was seen and examined today.  Feels " sore" in the epigastric region, no nausea or vomiting.  Patient denies dizziness, chest pain, shortness of breath, numbess, tingling. No acute events overnight.  No  fevers Objective:   Vitals:   08/30/18 1600 08/30/18 1634 08/30/18 2108 08/31/18 0622  BP: (!) 153/74 (!) 180/89 137/77 (!) 144/75  Pulse: 81 80 79 79  Resp: 18 18 16 18   Temp:  97.8 F (36.6 C) 97.7 F (36.5 C) 98.3 F (36.8 C)  TempSrc:  Oral Oral Oral  SpO2: 94% 99% 98% 92%  Weight:  89.8 kg    Height:  6' (1.829 m)      Intake/Output Summary (Last 24 hours) at 08/31/2018 0958 Last data filed at 08/31/2018 0600 Gross per 24 hour  Intake 350.41 ml  Output 600 ml  Net -249.59 ml     Wt Readings from Last 3 Encounters:  08/30/18 89.8 kg  04/04/18 89 kg  11/27/17 89.7 kg     Exam  General: Alert and oriented x 3, NAD, hearing deficit  Eyes:    HEENT:  Atraumatic, normocephalic, normal oropharynx  Cardiovascular: S1 S2 auscultated, Regular rate and rhythm.  Respiratory: Clear to auscultation bilaterally, no wheezing, rales or rhonchi  Gastrointestinal: Soft, mild TTP, epigastric region, nondistended, + bowel sounds  Ext: no pedal edema bilaterally  Neuro: No new deficits  Musculoskeletal: No digital cyanosis, clubbing  Skin: No rashes  Psych: Normal affect and demeanor, alert and oriented x3    Data Reviewed:  I have personally reviewed following labs and imaging studies  Micro Results No results found for this or any previous visit (from the past 240 hour(s)).  Radiology Reports US Abdomen Limited Ruq  Result Date: 08/30/2018 CLINICAL DATA:  Known pancreatitis and generalized abdominal pain EXAM: ULTRASOUND ABDOMEN LIMITED RIGHT UPPER QUADRANT COMPARISON:  None. FINDINGS: Gallbladder: No gallstones or wall thickening visualized. No sonographic Murphy sign noted by sonographer. Common bile duct: Diameter: 4 mm Liver: Evaluation of the liver is extremely limited due to patient body habitus and overlying bowel gas. Visualized areas show increased echogenicity likely related to fatty infiltration. No other significant information can be gleaned. Portal vein is patent on color Doppler imaging with normal direction of blood flow towards the liver. IMPRESSION: Severely limited exam although no acute abnormality is noted. Electronically Signed   By: Inez Catalina M.D.   On: 08/30/2018 16:22    Lab Data:  CBC: Recent Labs  Lab 08/30/18 1332  WBC 8.0  HGB 14.3  HCT 46.0  MCV 94.3  PLT 629   Basic Metabolic Panel: Recent Labs  Lab 08/30/18 1332 08/31/18 0706  NA 142 141  K 4.2 4.0  CL 108 112*  CO2 27 26  GLUCOSE 118* 95  BUN 20 18  CREATININE 1.07 0.93  CALCIUM 9.7 8.9   GFR: Estimated Creatinine Clearance: 62.6 mL/min (by C-G formula based on SCr of 0.93 mg/dL). Liver Function Tests: Recent Labs  Lab  08/30/18 1332  AST 25  ALT 14  ALKPHOS 117  BILITOT 1.0  PROT 6.9  ALBUMIN 4.3   Recent Labs  Lab 08/30/18 1332 08/31/18 0706  LIPASE 1,311* 353*   No results for input(s): AMMONIA in the last 168 hours. Coagulation Profile: Recent Labs  Lab 08/30/18 1451  INR 2.8*   Cardiac Enzymes: No results for input(s): CKTOTAL, CKMB, CKMBINDEX, TROPONINI in the last 168 hours. BNP (last 3 results) Recent Labs    04/04/18 1116  PROBNP 680*   HbA1C: No results for input(s): HGBA1C in the last 72 hours. CBG: No results for input(s): GLUCAP in the last 168 hours. Lipid Profile: Recent Labs    08/30/18 1718  CHOL  118  HDL 46  LDLCALC 62  TRIG 49  CHOLHDL 2.6   Thyroid Function Tests: No results for input(s): TSH, T4TOTAL, FREET4, T3FREE, THYROIDAB in the last 72 hours. Anemia Panel: No results for input(s): VITAMINB12, FOLATE, FERRITIN, TIBC, IRON, RETICCTPCT in the last 72 hours. Urine analysis:    Component Value Date/Time   COLORURINE YELLOW 08/30/2018 2000   APPEARANCEUR CLEAR 08/30/2018 2000   LABSPEC 1.021 08/30/2018 2000   PHURINE 6.0 08/30/2018 2000   GLUCOSEU NEGATIVE 08/30/2018 2000   HGBUR NEGATIVE 08/30/2018 [place] NEGATIVE 08/30/2018 2000   KETONESUR 5 (A) 08/30/2018 2000   PROTEINUR 100 (A) 08/30/2018 2000   NITRITE NEGATIVE 08/30/2018 2000   LEUKOCYTESUR NEGATIVE 08/30/2018 2000     Bartley Vuolo M.D. [facility] 08/31/2018, 9:58 AM  Pager: [phone] Between 7am to 7pm - call Pager - [phone]  After 7pm go to [email or web address] - password [email or web address]  Call night coverage person covering after 7pm

## 2018-08-31 NOTE — Progress Notes (Addendum)
[facility] for warfarin Indication: Hx DVT/PE  Allergies  Allergen Reactions  . Gabapentin Other (See Comments)    drowsiness  . Nsaids     NOT SUPPOSED TO TAKE DUE TO BLOOD THINER MEDS    Patient Measurements: Height: 6' (182.9 cm) Weight: 198 lb (89.8 kg) IBW/kg (Calculated) : 77.6  Vital Signs: Temp: 98.3 F (36.8 C) (03/01 0622) Temp Source: Oral (03/01 0622) BP: 144/75 (03/01 0622) Pulse Rate: 79 (03/01 0622)  Labs: Recent Labs    08/30/18 1332 08/30/18 1451 08/31/18 0706  HGB 14.3  --   --   HCT 46.0  --   --   PLT 161  --   --   LABPROT  --  28.7*  --   INR  --  2.8*  --   CREATININE 1.07  --  0.93    Estimated Creatinine Clearance: 62.6 mL/min (by C-G formula based on SCr of 0.93 mg/dL).   Medical History: Past Medical History:  Diagnosis Date  . BPH (benign prostatic hyperplasia)   . GERD (gastroesophageal reflux disease)   . Pancreatitis   . Pneumonia     Medications:  Medications Prior to Admission  Medication Sig Dispense Refill Last Dose  . acetaminophen (TYLENOL) 500 MG tablet Take 1,000 mg by mouth 3 (three) times daily.    08/29/2018 at Unknown time  . atorvastatin (LIPITOR) 40 MG tablet Take 1 tablet (40 mg total) by mouth daily at 6 PM. 90 tablet 3 08/29/2018 at Unknown time  . pantoprazole (PROTONIX) 40 MG tablet Take 40 mg by mouth 2 (two) times daily.   08/29/2018 at Unknown time  . potassium chloride SA (K-DUR,KLOR-CON) 20 MEQ tablet TAKE 1 TABLET BY MOUTH ONCE DAILY 90 tablet 2 08/29/2018 at Unknown time  . torsemide (DEMADEX) 10 MG tablet TAKE 1 TABLET BY MOUTH ONCE DAILY 90 tablet 0 08/29/2018 at Unknown time  . warfarin (COUMADIN) 2.5 MG tablet Take 1 tablet (2.5 mg total) by mouth as directed. (Patient taking differently: Take 1.25-2.5 mg by mouth as directed. Take 0.5 tablet (1.25 mg) daily except on Friday Take 1 tablet (2.5 mg)) 90 tablet 0 08/29/2018 at 1900   Scheduled:    Assessment: 30 yoM  with Hx recurrent idiopathic pancreatitis, GERD, BPH, CAD, dCHF, lung CA/fibrosis, OSA on O2 admitted for pancreatitis. Pharmacy to dose warfarin while admitted   Baseline INR therapeutic on admission  Prior anticoagulation: warfarin 1.25 mg daily except 2.5 mg on Fridays - confirmed in Epic; LD 2/28 PM  Significant events:  Today, 08/31/2018:  CBC: WNL  INR therapeutic  Major drug interactions: none  No bleeding issues per nursing  CLD ordered, not intake charted yet  Goal of Therapy: INR 2-3  Plan:  Warfarin 1 mg PO tonight at 18:00 - given acute illness and reduced PO intake, will dose conservatively until INR trend apparent (taking into consideration missed dose 2/29 as well)  Daily INR  CBC at least q72 hr while on warfarin  Monitor for signs of bleeding or thrombosis   Reuel Boom, PharmD, BCPS [phone] 08/31/2018, 11:03 AM

## 2018-09-01 DIAGNOSIS — I482 Chronic atrial fibrillation, unspecified: Secondary | ICD-10-CM

## 2018-09-01 DIAGNOSIS — K8500 Idiopathic acute pancreatitis without necrosis or infection: Principal | ICD-10-CM

## 2018-09-01 LAB — CBC
HCT: 34 % — ABNORMAL LOW (ref 39.0–52.0)
Hemoglobin: 10.6 g/dL — ABNORMAL LOW (ref 13.0–17.0)
MCH: 29.8 pg (ref 26.0–34.0)
MCHC: 31.2 g/dL (ref 30.0–36.0)
MCV: 95.5 fL (ref 80.0–100.0)
Platelets: 100 10*3/uL — ABNORMAL LOW (ref 150–400)
RBC: 3.56 MIL/uL — ABNORMAL LOW (ref 4.22–5.81)
RDW: 13.6 % (ref 11.5–15.5)
WBC: 7.4 10*3/uL (ref 4.0–10.5)
nRBC: 0 % (ref 0.0–0.2)

## 2018-09-01 LAB — PROTIME-INR
INR: 3.9 — ABNORMAL HIGH (ref 0.8–1.2)
Prothrombin Time: 37.6 seconds — ABNORMAL HIGH (ref 11.4–15.2)

## 2018-09-01 LAB — BASIC METABOLIC PANEL
Anion gap: 4 — ABNORMAL LOW (ref 5–15)
BUN: 16 mg/dL (ref 8–23)
CO2: 26 mmol/L (ref 22–32)
Calcium: 8.9 mg/dL (ref 8.9–10.3)
Chloride: 110 mmol/L (ref 98–111)
Creatinine, Ser: 0.99 mg/dL (ref 0.61–1.24)
GFR calc Af Amer: >60 mL/min (ref >60)
GFR calc non Af Amer: >60 mL/min (ref >60)
Glucose, Bld: 100 mg/dL — ABNORMAL HIGH (ref 70–99)
Potassium: 4 mmol/L (ref 3.5–5.1)
SODIUM: 140 mmol/L (ref 135–145)

## 2018-09-01 LAB — LIPASE, BLOOD: LIPASE: 53 U/L — AB (ref 11–51)

## 2018-09-01 MED ORDER — ATORVASTATIN CALCIUM 40 MG PO TABS: 40.0000 mg | ORAL_TABLET | Freq: Every day | ORAL | Status: DC

## 2018-09-01 MED ORDER — ACETAMINOPHEN 500 MG PO TABS
1000.0000 mg | ORAL_TABLET | Freq: Three times a day (TID) | ORAL | Status: DC
  Administered 2018-09-01: 1000 mg via ORAL
  Filled 2018-09-01: qty 2

## 2018-09-01 MED ORDER — OXYCODONE-ACETAMINOPHEN 5-325 MG PO TABS: 1.0000 | ORAL_TABLET | Freq: Four times a day (QID) | ORAL | Status: DC | PRN

## 2018-09-01 MED ORDER — ONDANSETRON 4 MG PO TBDP: 4.0000 mg | ORAL_TABLET | Freq: Three times a day (TID) | ORAL | 0 refills | Status: DC | PRN

## 2018-09-01 MED ORDER — TRAMADOL HCL 50 MG PO TABS: 50.0000 mg | ORAL_TABLET | Freq: Two times a day (BID) | ORAL | 0 refills | Status: DC | PRN

## 2018-09-01 NOTE — Progress Notes (Signed)
Patient discharged to home, all discharge medications and instructions reviewed and questions answered.  Patient to be assisted to vehicle by wheelchair.  

## 2018-09-01 NOTE — Progress Notes (Signed)
[facility] for warfarin Indication: Hx DVT/PE  Allergies  Allergen Reactions  . Gabapentin Other (See Comments)    drowsiness  . Nsaids     NOT SUPPOSED TO TAKE DUE TO BLOOD THINER MEDS    Patient Measurements: Height: 6' (182.9 cm) Weight: 198 lb (89.8 kg) IBW/kg (Calculated) : 77.6  Vital Signs: Temp: 97.5 F (36.4 C) (03/02 0554) Temp Source: Oral (03/02 0554) BP: 88/77 (03/02 0554) Pulse Rate: 80 (03/02 0554)  Labs: Recent Labs    08/30/18 1332 08/30/18 1451 08/31/18 0706 09/01/18 0451 09/01/18 0729  HGB 14.3  --   --  10.6*  --   HCT 46.0  --   --  34.0*  --   PLT 161  --   --  100*  --   LABPROT  --  28.7*  --   --  37.6*  INR  --  2.8*  --   --  3.9*  CREATININE 1.07  --  0.93 0.99  --     Estimated Creatinine Clearance: 58.8 mL/min (by C-G formula based on SCr of 0.99 mg/dL).   Medical History: Past Medical History:  Diagnosis Date  . BPH (benign prostatic hyperplasia)   . GERD (gastroesophageal reflux disease)   . Pancreatitis   . Pneumonia     Medications:  Medications Prior to Admission  Medication Sig Dispense Refill Last Dose  . acetaminophen (TYLENOL) 500 MG tablet Take 1,000 mg by mouth 3 (three) times daily.    08/29/2018 at Unknown time  . atorvastatin (LIPITOR) 40 MG tablet Take 1 tablet (40 mg total) by mouth daily at 6 PM. 90 tablet 3 08/29/2018 at Unknown time  . pantoprazole (PROTONIX) 40 MG tablet Take 40 mg by mouth 2 (two) times daily.   08/29/2018 at Unknown time  . potassium chloride SA (K-DUR,KLOR-CON) 20 MEQ tablet TAKE 1 TABLET BY MOUTH ONCE DAILY 90 tablet 2 08/29/2018 at Unknown time  . torsemide (DEMADEX) 10 MG tablet TAKE 1 TABLET BY MOUTH ONCE DAILY 90 tablet 0 08/29/2018 at Unknown time  . warfarin (COUMADIN) 2.5 MG tablet Take 1 tablet (2.5 mg total) by mouth as directed. (Patient taking differently: Take 1.25-2.5 mg by mouth as directed. Take 0.5 tablet (1.25 mg) daily except on Friday  Take 1 tablet (2.5 mg)) 90 tablet 0 08/29/2018 at 1900   Scheduled:  . Warfarin - Pharmacist Dosing Inpatient   Does not apply q1800    Assessment: 8 yoM with Hx recurrent idiopathic pancreatitis, GERD, BPH, CAD, dCHF, lung CA/fibrosis, OSA on O2 admitted for pancreatitis. Pharmacy to dose warfarin while admitted   Baseline INR therapeutic on admission  Prior anticoagulation: warfarin 1.25 mg daily except 2.5 mg on Fridays - confirmed in Epic; LD 2/28 PM  Significant events:  Today, 09/01/2018:  INR SUPRAtherapeutic, note 1mg  warfarin given 3/1  Hgb 10.6 down, Plt 100 down - monitor  Major drug interactions: none  No bleeding issues per nursing  CLD ordered, minimal intake per charting  Goal of Therapy: INR 2-3  Plan:  NO warfarin today  Daily INR  CBC at least q72 hr while on warfarin  Monitor for signs of bleeding or thrombosis   Adrian Saran, PharmD, BCPS Pager [phone] 09/01/2018 7:55 AM

## 2018-09-01 NOTE — Evaluation (Signed)
Occupational Therapy Evaluation Patient Details Name: Jerome Newman MRN: [id number] DOB: 08-12-1929 Today's Date: 09/01/2018    History of Present Illness Jerome Newman is a 85 y.o. male with Past medical history of recurrent pancreatitis, GERD, BPH, CAD, pacemaker implant, chronic diastolic CHF,Lung cancer S/P lobectomy and radiation,  lung fibrosis, OSA with chronic respiratory failure on nocturnal oxygen, BPH.   Clinical Impression   Pt is supervised for showering, ambulates with a RW and relies on wife for IADL. He presents with impaired balance and requires min assist to rise from bed into standing. He requires set up to min assist for ADL. Wife and patient have no concerns about d/c home today and all DME needs are met.    Follow Up Recommendations  No OT follow up    Equipment Recommendations  None recommended by OT    Recommendations for Other Services       Precautions / Restrictions Precautions Precautions: Fall Restrictions Weight Bearing Restrictions: No      Mobility Bed Mobility Overal bed mobility: Needs Assistance Bed Mobility: Rolling;Sidelying to Sit Rolling: Supervision Sidelying to sit: Supervision     Sit to sidelying: Supervision General bed mobility comments: utilized log roll technique to minimize chronic back pain  Transfers Overall transfer level: Needs assistance Equipment used: Rolling walker (2 wheeled) Transfers: Sit to/from Stand Sit to Stand: Min assist         General transfer comment: min assist to rise from bed, recommended use of his 3 in 1 over toilet    Balance Overall balance assessment: Needs assistance   Sitting balance-Leahy Scale: Fair     Standing balance support: Bilateral upper extremity supported Standing balance-Leahy Scale: Poor Standing balance comment: UE support in standing                           ADL either performed or assessed with clinical judgement   ADL Overall ADL's : Needs  assistance/impaired Eating/Feeding: Independent   Grooming: Min guard;Standing   Upper Body Bathing: Supervision/ safety;Sitting   Lower Body Bathing: Minimal assistance;Sit to/from stand   Upper Body Dressing : Set up;Sitting   Lower Body Dressing: Minimal assistance;Sit to/from stand   Toilet Transfer: Min guard;RW;BSC;Ambulation   Toileting- Water quality scientist and Hygiene: Minimal assistance;Sit to/from stand       Functional mobility during ADLs: Min guard;Rolling walker General ADL Comments: wife will assist pt as needed until he returns to his baseline     Vision Patient Visual Report: No change from baseline       Perception     Praxis      Pertinent Vitals/Pain Pain Assessment: Faces Faces Pain Scale: Hurts little more Pain Location: abdomen Pain Descriptors / Indicators: Sore Pain Intervention(s): Monitored during session;Repositioned     Hand Dominance Right   Extremity/Trunk Assessment Upper Extremity Assessment Upper Extremity Assessment: Overall WFL for tasks assessed(arthritic changes in hands)   Lower Extremity Assessment Lower Extremity Assessment: RLE deficits/detail;LLE deficits/detail RLE Deficits / Details: reports arthritis in knee LLE Deficits / Details: reports arthritis in knee       Communication Communication Communication: HOH   Cognition Arousal/Alertness: Awake/alert Behavior During Therapy: WFL for tasks assessed/performed Overall Cognitive Status: Impaired/Different from baseline Area of Impairment: Memory                               General Comments: pt with some difficulty offering home  equipment information, wife assisting   General Comments       Exercises     Shoulder Instructions      Home Living Family/patient expects to be discharged to:: Private residence Living Arrangements: Spouse/significant other Available Help at Discharge: Family;Available 24 hours/day Type of Home: House Home  Access: Level entry     Home Layout: One level     Bathroom Shower/Tub: Occupational psychologist: Standard     Home Equipment: Environmental consultant - 2 wheels;Wheelchair - manual;Shower seat;Bedside commode;Hand held shower head;Grab bars - tub/shower          Prior Functioning/Environment Level of Independence: Independent with assistive device(s)        Comments: reports home was built for aging, completely accessible        OT Problem List:        OT Treatment/Interventions:      OT Goals(Current goals can be found in the care plan section) Acute Rehab OT Goals Patient Stated Goal: to go home today  OT Frequency:     Barriers to D/C:            Co-evaluation              AM-PAC OT "6 Clicks" Daily Activity     Outcome Measure Help from another person eating meals?: None Help from another person taking care of personal grooming?: A Little Help from another person toileting, which includes using toliet, bedpan, or urinal?: A Little Help from another person bathing (including washing, rinsing, drying)?: A Little Help from another person to put on and taking off regular upper body clothing?: None Help from another person to put on and taking off regular lower body clothing?: A Little 6 Click Score: 20   End of Session Equipment Utilized During Treatment: Gait belt;Rolling walker Nurse Communication: Other (comment)(pt already has a 3 in 1, ready for d/c)  Activity Tolerance: Patient tolerated treatment well Patient left: in bed;with call bell/phone within reach;with family/visitor present  OT Visit Diagnosis: Unsteadiness on feet (R26.81);Other abnormalities of gait and mobility (R26.89);Pain                Time: 1350-1405 OT Time Calculation (min): 15 min Charges:  OT General Charges $OT Visit: 1 Visit OT Evaluation $OT Eval Moderate Complexity: 1 Mod  Nestor Lewandowsky, OTR/L Acute Rehabilitation Services Pager: [phone] Office:  [phone] Malka So 09/01/2018, 2:29 PM

## 2018-09-01 NOTE — Evaluation (Signed)
Physical Therapy Evaluation Patient Details Name: Jerome Newman MRN: [id number] DOB: [birth year] Today's Date: 09/01/2018   History of Present Illness  Jerome Newman is a 85 y.o. male with Past medical history of recurrent pancreatitis, GERD, BPH, CAD, pacemaker implant, chronic diastolic CHF,Lung cancer S/P lobectomy and radiation,  lung fibrosis, OSA with chronic respiratory failure on nocturnal oxygen, BPH.  Clinical Impression  Patient presents with mobility per his report not too far off baseline.  Not interested right now in HHPT, but states has support from family and needed equipment, but states has standard toilet and no 3:1 so recommending that for home due to knee OA and assist for sit to stand from edge of bed.  Will follow along if not d/c home today.     Follow Up Recommendations No PT follow up    Equipment Recommendations  3in1 (PT)    Recommendations for Other Services       Precautions / Restrictions Precautions Precautions: Fall Restrictions Weight Bearing Restrictions: No      Mobility  Bed Mobility Overal bed mobility: Needs Assistance Bed Mobility: Rolling;Sidelying to Sit;Sit to Sidelying Rolling: Supervision Sidelying to sit: Supervision     Sit to sidelying: Min assist General bed mobility comments: cues for technique due to reports history of back trouble; to sidelying little help lifting one leg  Transfers Overall transfer level: Needs assistance Equipment used: Rolling walker (2 wheeled) Transfers: Sit to/from Stand Sit to Stand: Min assist;Mod assist         General transfer comment: increased time, some lifting help  Ambulation/Gait Ambulation/Gait assistance: Min guard;Supervision Gait Distance (Feet): 70 Feet(& 40') Assistive device: Rolling walker (2 wheeled) Gait Pattern/deviations: Step-to pattern;Step-through pattern;Decreased stride length     General Gait Details: relatively steady with RW.  One seated rest in recliner which  kept close for safety,  Noted R knee more valgus than L, but pt reports painful on both.  Noted SOB with ambulation, SpO2 92% on RA, HR 84 (w/PPM)  Stairs            Wheelchair Mobility    Modified Rankin (Stroke Patients Only)       Balance Overall balance assessment: Needs assistance   Sitting balance-Leahy Scale: Fair     Standing balance support: Bilateral upper extremity supported Standing balance-Leahy Scale: Poor Standing balance comment: UE support in standing                             Pertinent Vitals/Pain Pain Assessment: Faces Faces Pain Scale: Hurts little more Pain Location: abdomen Pain Descriptors / Indicators: Sore Pain Intervention(s): Monitored during session;Premedicated before session    Home Living Family/patient expects to be discharged to:: Private residence Living Arrangements: Spouse/significant other Available Help at Discharge: Family Type of Home: House Home Access: Level entry     Home Layout: One level Home Equipment: Environmental consultant - 2 wheels;Wheelchair - manual      Prior Function Level of Independence: Independent with assistive device(s)         Comments: reports built his house wheelchair accessible because he was in a wheelchair for awhile, then reports did couple rounds of PT and has been able to walk up to 5 minutes on the treadmill and up to an hour on the stationary bike     Hand Dominance        Extremity/Trunk Assessment   Upper Extremity Assessment Upper Extremity Assessment: Generalized weakness    Lower  Extremity Assessment Lower Extremity Assessment: Generalized weakness(reports needs knee surgery, but waited too long)       Communication   Communication: HOH  Cognition Arousal/Alertness: Awake/alert Behavior During Therapy: WFL for tasks assessed/performed Overall Cognitive Status: No family/caregiver present to determine baseline cognitive functioning                                         General Comments      Exercises     Assessment/Plan    PT Assessment Patent does not need any further PT services  PT Problem List         PT Treatment Interventions      PT Goals (Current goals can be found in the Care Plan section)  Acute Rehab PT Goals Patient Stated Goal: to go home today PT Goal Formulation: With patient Time For Goal Achievement: 09/08/18 Potential to Achieve Goals: Good    Frequency     Barriers to discharge        Co-evaluation               AM-PAC PT "6 Clicks" Mobility  Outcome Measure Help needed turning from your back to your side while in a flat bed without using bedrails?: A Little Help needed moving from lying on your back to sitting on the side of a flat bed without using bedrails?: A Little Help needed moving to and from a bed to a chair (including a wheelchair)?: A Little Help needed standing up from a chair using your arms (e.g., wheelchair or bedside chair)?: A Little Help needed to walk in hospital room?: A Little Help needed climbing 3-5 steps with a railing? : A Lot 6 Click Score: 17    End of Session Equipment Utilized During Treatment: Gait belt Activity Tolerance: Patient tolerated treatment well Patient left: in bed;with call bell/phone within reach;with bed alarm set   PT Visit Diagnosis: Muscle weakness (generalized) (M62.81);Other abnormalities of gait and mobility (R26.89)    Time: 8882-8003 PT Time Calculation (min) (ACUTE ONLY): 23 min   Charges:   PT Evaluation $PT Eval Moderate Complexity: 1 Mod PT Treatments $Gait Training: 8-22 mins        Magda Kiel, [place] [facility] [phone] 09/01/2018   Reginia Naas 09/01/2018, 12:47 PM

## 2018-09-01 NOTE — Care Management (Signed)
Per staff RN pt does not need 3in1 ordered by MD. Marney Doctor RN,BSN [phone]

## 2018-09-01 NOTE — Discharge Summary (Signed)
Physician Discharge Summary   Patient ID: Jerome Newman MRN: [id number] DOB/AGE: [birth year] 85 y.o.  Admit date: 08/30/2018 Discharge date: 09/01/2018  Primary Care Physician:  [name]., MD   Recommendations for Outpatient Follow-up:  1. Follow up with PCP in 1-2 weeks  Home Health: None  Equipment/Devices: 3n1  Discharge Condition: stable CODE STATUS: FULL  Diet recommendation:    Discharge Diagnoses:    . Acute recurrent pancreatitis, idiopathic . COPD, mild ([place]) . Chronic atrial fibrillation . CAD (coronary artery disease) . Hyperlipidemia . Pulmonary embolism ([place]) . Malignant neoplasm of upper lobe of left lung (HCC)   Consults: None    Allergies:   Allergies  Allergen Reactions  . Gabapentin Other (See Comments)    drowsiness  . Nsaids     NOT SUPPOSED TO TAKE DUE TO BLOOD THINER MEDS     DISCHARGE MEDICATIONS: Allergies as of 09/01/2018      Reactions   Gabapentin Other (See Comments)   drowsiness   Nsaids    NOT SUPPOSED TO TAKE DUE TO BLOOD THINER MEDS      Medication List    TAKE these medications   acetaminophen 500 MG tablet Commonly known as:  TYLENOL Take 1,000 mg by mouth 3 (three) times daily.   atorvastatin 40 MG tablet Commonly known as:  LIPITOR Take 1 tablet (40 mg total) by mouth daily at 6 PM.   ondansetron 4 MG disintegrating tablet Commonly known as:  ZOFRAN ODT Take 1 tablet (4 mg total) by mouth every 8 (eight) hours as needed for nausea or vomiting.   pantoprazole 40 MG tablet Commonly known as:  PROTONIX Take 40 mg by mouth 2 (two) times daily.   potassium chloride SA 20 MEQ tablet Commonly known as:  K-DUR,KLOR-CON TAKE 1 TABLET BY MOUTH ONCE DAILY   torsemide 10 MG tablet Commonly known as:  DEMADEX TAKE 1 TABLET BY MOUTH ONCE DAILY   traMADol 50 MG tablet Commonly known as:  ULTRAM Take 1 tablet (50 mg total) by mouth every 12 (twelve) hours as needed for severe pain.   warfarin 2.5 MG  tablet Commonly known as:  COUMADIN Take as directed. If you are unsure how to take this medication, talk to your nurse or doctor. Original instructions:  Take 1 tablet (2.5 mg total) by mouth as directed. What changed:    how much to take  additional instructions        Brief H and P: For complete details please refer to admission H and P, but in brief Patient is 85 year old male with history of recurrent pancreatitis, GERD, BPH, CAD, chronic diastolic CHF, lung CA status post lobectomy, radiation, lung fibrosis, OSA with chronic respiratory failure on nocturnal O2, BPH presented with abdominal pain, epigastric, nausea and vomiting.  Patient was found to have pancreatitis, per patient, this is her fourth episode of pancreatitis, has been following up with his GI in [place] and work-up in the past had not determine the etiology of recurrent pancreatitis.  Denies any alcohol use.   Hospital Course:  Acute pancreatitis, recurrent episodes, idiopathic -Follows Dr. Lyda Jester in [place], gastroenterology, headache extensive work-up in the past with no etiology found -No alcohol use, no hypertriglyceridemia, no gallstones or choledocholithiasis on ultrasound. -Will obtain HIDA scan (per wife has not been done in the past).  LFTs normal -Lipase 1311 at the time of admission, now improved to 53, patient tolerating soft diet without any difficulty -HIDA scan was ordered to rule out biliary dyskinesia,  however patient's wife requested to cancel the test as patient is now tolerating diet without any difficulty, nausea, vomiting or pain.  She will discuss with patient's gastroenterologist, Dr. Lyda Jester regarding HIDA scan if patient has another episode of pancreatitis.     CAD (coronary artery disease) -Currently no acute issues, restart home meds   Chronic diastolic CHF, status post pacemaker implantation -Currently euvolemic patient can restart torsemide tomorrow  History of PE, DVT   -INR 3.9.  Patient's wife instructed to hold off on patient's Coumadin today    Hyperlipidemia -Continue Lipitor    Malignant neoplasm of upper lobe of left lung, squamous cell carcinoma right lung (HCC) -Outpatient follow-up with radiation oncology    COPD, mild (HCC) -Currently stable, no wheezing    Chronic atrial fibrillation -INR 3.9, patient recommended to hold Coumadin today  Generalized debility PT evaluation was done, recommended no PT, back at baseline  Day of Discharge S: No acute issues, tolerating diet without any difficulty.  Wants to go home today.  No fevers  BP 130/73 (BP Location: Right Arm)   Pulse 79   Temp 98.2 F (36.8 C) (Oral)   Resp 18   Ht 6' (1.829 m)   Wt 89.8 kg   SpO2 93%   BMI 26.85 kg/m   Physical Exam: General: Alert and awake oriented x3 not in any acute distress. HEENT: anicteric sclera, pupils reactive to light and accommodation CVS: S1-S2 clear no murmur rubs or gallops Chest: clear to auscultation bilaterally, no wheezing rales or rhonchi Abdomen: soft nontender, nondistended, normal bowel sounds Extremities: no cyanosis, clubbing or edema noted bilaterally Neuro: Cranial nerves II-XII intact, no focal neurological deficits   The results of significant diagnostics from this hospitalization (including imaging, microbiology, ancillary and laboratory) are listed below for reference.      Procedures/Studies:  US Abdomen Limited Ruq  Result Date: 08/30/2018 CLINICAL DATA:  Known pancreatitis and generalized abdominal pain EXAM: ULTRASOUND ABDOMEN LIMITED RIGHT UPPER QUADRANT COMPARISON:  None. FINDINGS: Gallbladder: No gallstones or wall thickening visualized. No sonographic Murphy sign noted by sonographer. Common bile duct: Diameter: 4 mm Liver: Evaluation of the liver is extremely limited due to patient body habitus and overlying bowel gas. Visualized areas show increased echogenicity likely related to fatty infiltration. No  other significant information can be gleaned. Portal vein is patent on color Doppler imaging with normal direction of blood flow towards the liver. IMPRESSION: Severely limited exam although no acute abnormality is noted. Electronically Signed   By: Inez Catalina M.D.   On: 08/30/2018 16:22       LAB RESULTS: Basic Metabolic Panel: Recent Labs  Lab 08/31/18 0706 09/01/18 0451  NA 141 140  K 4.0 4.0  CL 112* 110  CO2 26 26  GLUCOSE 95 100*  BUN 18 16  CREATININE 0.93 0.99  CALCIUM 8.9 8.9   Liver Function Tests: Recent Labs  Lab 08/30/18 1332  AST 25  ALT 14  ALKPHOS 117  BILITOT 1.0  PROT 6.9  ALBUMIN 4.3   Recent Labs  Lab 08/31/18 0706 09/01/18 0451  LIPASE 353* 53*   No results for input(s): AMMONIA in the last 168 hours. CBC: Recent Labs  Lab 08/30/18 1332 09/01/18 0451  WBC 8.0 7.4  HGB 14.3 10.6*  HCT 46.0 34.0*  MCV 94.3 95.5  PLT 161 100*   Cardiac Enzymes: No results for input(s): CKTOTAL, CKMB, CKMBINDEX, TROPONINI in the last 168 hours. BNP: Invalid input(s): POCBNP CBG: No results for input(s):  GLUCAP in the last 168 hours.    Disposition and Follow-up: Discharge Instructions    Diet - low sodium heart healthy   Complete by:  As directed    Discharge instructions   Complete by:  As directed    HOLD COUMADIN TONIGHT   Increase activity slowly   Complete by:  As directed        DISPOSITION: Home   DISCHARGE FOLLOW-UP Follow-up Information    [place], Marshall Cork., MD. Schedule an appointment as soon as possible for a visit in 2 week(s).   Specialty:  Family Medicine Contact information: [address] [address] [phone]        Misenheimer, Christia Reading, MD. Schedule an appointment as soon as possible for a visit in 2 week(s).   Specialty:  Unknown Physician Specialty Contact information: [place] [address] [phone]            Time coordinating discharge:  35  minutes  Signed:   Estill Cotta M.D. [facility] 09/01/2018, 1:15 PM

## 2018-09-04 ENCOUNTER — Encounter: Payer: Self-pay | Admitting: Cardiology

## 2018-09-04 ENCOUNTER — Ambulatory Visit (INDEPENDENT_AMBULATORY_CARE_PROVIDER_SITE_OTHER): Payer: Medicare Other | Admitting: Cardiology

## 2018-09-04 VITALS — BP 112/64 | HR 83 | Ht 72.0 in | Wt 202.8 lb

## 2018-09-04 DIAGNOSIS — Z7901 Long term (current) use of anticoagulants: Secondary | ICD-10-CM

## 2018-09-04 DIAGNOSIS — I482 Chronic atrial fibrillation, unspecified: Secondary | ICD-10-CM

## 2018-09-04 DIAGNOSIS — Z950 Presence of cardiac pacemaker: Secondary | ICD-10-CM | POA: Diagnosis not present

## 2018-09-04 DIAGNOSIS — I5032 Chronic diastolic (congestive) heart failure: Secondary | ICD-10-CM

## 2018-09-04 NOTE — Progress Notes (Signed)
Cardiology Office Note:    Date:  09/04/2018   ID:  Jerome Newman, DOB 08-08-1929, MRN [id number]  PCP:  Jerome Newman., MD  Cardiologist:  Jerome More, MD    Referring MD: Jerome Newman., MD    ASSESSMENT:    1. Chronic atrial fibrillation   2. Status post biventricular pacemaker   3. Chronic anticoagulation   4. Chronic diastolic congestive heart failure ([place])    PLAN:    In order of problems listed above:  1. Stable rate controlled has had AV ablation permanent pacemaker and anticoagulated 2. Stable device followed in our clinic 3. Stable continue his anticoagulant 4. Presently compensated his wife has good healthcare literacy managed him appropriately and will continue his current diuretic his recent CBC BMP and proBNP levels reviewed stable.   Next appointment: 6 months   Medication Adjustments/Labs and Tests Ordered: Current medicines are reviewed at length with the patient today.  Concerns regarding medicines are outlined above.  Orders Placed This Encounter  Procedures  . Protime-INR   No orders of the defined types were placed in this encounter.   Chief Complaint  Patient presents with  . Follow-up  . Congestive Heart Failure  . Atrial Fibrillation    and a pacemaker    History of Present Illness:    Jerome Newman is a 85 y.o. male with a hx of lung Cancer, CHF, chronic Atrial Fibrillation, AVN ablation and BiV pacemaker, chronic anticoagulation for AF and previous remote DVT and PE   last seen 07/05/17. Compliance with diet, lifestyle and medications: Yes  Jerome Newman is not doing well he had another episode of pancreatitis and seeks my advice whether he should undergo evaluation for gallbladder disease I encouraged him to do it.  He is having severe knee pain and is having injections.  While in hospital his diuretic was held he was fluid loaded and had mild heart failure and his wife and return home doubled his diuretics to his weight came to baseline  and now has no edema shortness of breath orthopnea chest pain palpitation or syncope he is on his anticoagulant without bleeding. Past Medical History:  Diagnosis Date  . BPH (benign prostatic hyperplasia)   . GERD (gastroesophageal reflux disease)   . Pancreatitis   . Pneumonia     Past Surgical History:  Procedure Laterality Date  . AV NODE ABLATION  01/18/2016  . BI-VENTRICULAR PACEMAKER INSERTION (CRT-P)    . CARDIOVERSION    . HERNIA REPAIR    . LUNG REMOVAL, PARTIAL Right     Current Medications: Current Meds  Medication Sig  . acetaminophen (TYLENOL) 500 MG tablet Take 1,000 mg by mouth 3 (three) times daily.   Marland Kitchen atorvastatin (LIPITOR) 40 MG tablet Take 1 tablet (40 mg total) by mouth daily at 6 PM.  . pantoprazole (PROTONIX) 40 MG tablet Take 40 mg by mouth 2 (two) times daily.  . potassium chloride SA (K-DUR,KLOR-CON) 20 MEQ tablet TAKE 1 TABLET BY MOUTH ONCE DAILY  . torsemide (DEMADEX) 10 MG tablet TAKE 1 TABLET BY MOUTH ONCE DAILY  . warfarin (COUMADIN) 2.5 MG tablet Take 1 tablet (2.5 mg total) by mouth as directed. (Patient taking differently: Take 2.5 mg by mouth as directed. Take 1.25 mg everyday except 2.5 mg on Friday)     Allergies:   Gabapentin and Nsaids   Social History   Socioeconomic History  . Marital status: Married    Spouse name: Not on file  . Number of  children: Not on file  . Years of education: Not on file  . Highest education level: Not on file  Occupational History  . Not on file  Social Needs  . Financial resource strain: Not on file  . Food insecurity:    Worry: Not on file    Inability: Not on file  . Transportation needs:    Medical: Not on file    Non-medical: Not on file  Tobacco Use  . Smoking status: Former Research scientist (life sciences)  . Smokeless tobacco: Never Used  Substance and Sexual Activity  . Alcohol use: Not Currently  . Drug use: Not Currently  . Sexual activity: Not on file  Lifestyle  . Physical activity:    Days per week: Not  on file    Minutes per session: Not on file  . Stress: Not on file  Relationships  . Social connections:    Talks on phone: Not on file    Gets together: Not on file    Attends religious service: Not on file    Active member of club or organization: Not on file    Attends meetings of clubs or organizations: Not on file    Relationship status: Not on file  Other Topics Concern  . Not on file  Social History Narrative  . Not on file     Family History: The patient's family history includes Cancer in his sister; Heart disease in his father. ROS:   Please see the history of present illness.    All other systems reviewed and are negative.  EKGs/Labs/Other Studies Reviewed:    The following studies were reviewed today:   Recent Labs: 04/04/2018: NT-Pro BNP 680 08/30/2018: ALT 14 09/01/2018: BUN 16; Creatinine, Ser 0.99; Hemoglobin 10.6; Platelets 100; Potassium 4.0; Sodium 140  Recent Lipid Panel    Component Value Date/Time   CHOL 118 08/30/2018 1718   TRIG 49 08/30/2018 1718   HDL 46 08/30/2018 1718   CHOLHDL 2.6 08/30/2018 1718   VLDL 10 08/30/2018 1718   LDLCALC 62 08/30/2018 1718    Physical Exam:    VS:  BP 112/64 (BP Location: Right Arm, Patient Position: Sitting, Cuff Size: Normal)   Pulse 83   Ht 6' (1.829 m)   Wt 202 lb 12.8 oz (92 kg)   SpO2 95%   BMI 27.50 kg/m     Wt Readings from Last 3 Encounters:  09/04/18 202 lb 12.8 oz (92 kg)  08/30/18 198 lb (89.8 kg)  04/04/18 196 lb 3.2 oz (89 kg)     GEN:  Well nourished, well developed in no acute distress HEENT: Normal NECK: No JVD; No carotid bruits LYMPHATICS: No lymphadenopathy CARDIAC: RRR soft s1  murmurs, rubs, gallops RESPIRATORY:  Clear to auscultation without rales, wheezing or rhonchi  ABDOMEN: Soft, non-tender, non-distended MUSCULOSKELETAL:  No edema; No deformity  SKIN: Warm and dry NEUROLOGIC:  Alert and oriented x 3 PSYCHIATRIC:  Normal affect    Signed, Jerome More, MD  09/04/2018  5:18 PM    [facility]

## 2018-09-04 NOTE — Patient Instructions (Addendum)
Medication Instructions:  Your physician recommends that you continue on your current medications as directed. Please refer to the Current Medication list given to you today.  If you need a refill on your cardiac medications before your next appointment, please call your pharmacy.   Lab work: Your physician recommends that you return for lab work today: PT/INR.   If you have labs (blood work) drawn today and your tests are completely normal, you will receive your results only by: Marland Kitchen MyChart Message (if you have MyChart) OR . A paper copy in the mail If you have any lab test that is abnormal or we need to change your treatment, we will call you to review the results.  Testing/Procedures: None  Follow-Up: At [facility], you and your health needs are our priority.  As part of our continuing mission to provide you with exceptional heart care, we have created designated Provider Care Teams.  These Care Teams include your primary Cardiologist (physician) and Advanced Practice Providers (APPs -  Physician Assistants and Nurse Practitioners) who all work together to provide you with the care you need, when you need it. You will need a follow up appointment in 6 months.

## 2018-09-05 ENCOUNTER — Encounter: Payer: Self-pay | Admitting: Gastroenterology

## 2018-09-05 ENCOUNTER — Ambulatory Visit (INDEPENDENT_AMBULATORY_CARE_PROVIDER_SITE_OTHER): Payer: Medicare Other | Admitting: Pharmacist

## 2018-09-05 DIAGNOSIS — Z5181 Encounter for therapeutic drug level monitoring: Secondary | ICD-10-CM

## 2018-09-05 DIAGNOSIS — Z7901 Long term (current) use of anticoagulants: Secondary | ICD-10-CM | POA: Diagnosis not present

## 2018-09-05 DIAGNOSIS — I482 Chronic atrial fibrillation, unspecified: Secondary | ICD-10-CM

## 2018-09-05 LAB — PROTIME-INR
INR: 2.3 — ABNORMAL HIGH (ref 0.8–1.2)
Prothrombin Time: 22.3 s — ABNORMAL HIGH (ref 9.1–12.0)

## 2018-09-30 ENCOUNTER — Ambulatory Visit: Payer: Medicare Other | Admitting: Gastroenterology

## 2018-10-13 ENCOUNTER — Telehealth: Payer: Self-pay

## 2018-10-13 NOTE — Telephone Encounter (Signed)
lmom for prescreen  

## 2018-10-14 ENCOUNTER — Other Ambulatory Visit: Payer: Self-pay

## 2018-10-27 ENCOUNTER — Telehealth: Payer: Self-pay

## 2018-10-27 NOTE — Telephone Encounter (Signed)
lmom for prescreen  

## 2018-10-28 ENCOUNTER — Other Ambulatory Visit: Payer: Self-pay

## 2018-10-28 ENCOUNTER — Ambulatory Visit (INDEPENDENT_AMBULATORY_CARE_PROVIDER_SITE_OTHER): Payer: Medicare Other | Admitting: *Deleted

## 2018-10-28 DIAGNOSIS — I482 Chronic atrial fibrillation, unspecified: Secondary | ICD-10-CM

## 2018-10-28 DIAGNOSIS — Z5181 Encounter for therapeutic drug level monitoring: Secondary | ICD-10-CM | POA: Diagnosis not present

## 2018-10-28 DIAGNOSIS — Z7901 Long term (current) use of anticoagulants: Secondary | ICD-10-CM

## 2018-10-28 LAB — POCT INR: INR: 2.9 (ref 2.0–3.0)

## 2018-10-28 NOTE — Patient Instructions (Addendum)
Description   Spoke with wife-Jerome Newman-on DPR and instructed to continue to take 1/2 tablet (1.25 mg) daily except for 1 tablet (2.5 mg) on Fridays. Remain consistent with leafy green vegetables. Recheck INR in 6 weeks. Please call the office with any medication changes or additions [phone].

## 2018-10-31 ENCOUNTER — Other Ambulatory Visit: Payer: Self-pay | Admitting: Cardiology

## 2018-11-21 ENCOUNTER — Other Ambulatory Visit: Payer: Self-pay | Admitting: Cardiology

## 2018-12-02 ENCOUNTER — Other Ambulatory Visit: Payer: Self-pay | Admitting: Cardiology

## 2018-12-02 NOTE — Telephone Encounter (Signed)
Please refill.  Thank you 

## 2018-12-10 ENCOUNTER — Telehealth: Payer: Self-pay

## 2018-12-10 NOTE — Telephone Encounter (Signed)
lmom for prescreen  

## 2018-12-16 ENCOUNTER — Ambulatory Visit (INDEPENDENT_AMBULATORY_CARE_PROVIDER_SITE_OTHER): Payer: Medicare Other | Admitting: *Deleted

## 2018-12-16 ENCOUNTER — Other Ambulatory Visit: Payer: Self-pay

## 2018-12-16 DIAGNOSIS — Z5181 Encounter for therapeutic drug level monitoring: Secondary | ICD-10-CM | POA: Diagnosis not present

## 2018-12-16 DIAGNOSIS — I482 Chronic atrial fibrillation, unspecified: Secondary | ICD-10-CM | POA: Diagnosis not present

## 2018-12-16 DIAGNOSIS — Z7901 Long term (current) use of anticoagulants: Secondary | ICD-10-CM | POA: Diagnosis not present

## 2018-12-16 LAB — POCT INR: INR: 2.9 (ref 2.0–3.0)

## 2018-12-16 NOTE — Telephone Encounter (Signed)

## 2018-12-16 NOTE — Patient Instructions (Signed)
Description   Continue to take 1/2 tablet (1.25 mg) daily except for 1 tablet (2.5 mg) on Fridays. Remain consistent with leafy green vegetables. Recheck INR in 6 weeks. Please call the office with any medication changes or additions [phone].

## 2019-01-27 ENCOUNTER — Ambulatory Visit (INDEPENDENT_AMBULATORY_CARE_PROVIDER_SITE_OTHER): Payer: Medicare Other | Admitting: *Deleted

## 2019-01-27 ENCOUNTER — Other Ambulatory Visit: Payer: Self-pay

## 2019-01-27 DIAGNOSIS — I482 Chronic atrial fibrillation, unspecified: Secondary | ICD-10-CM | POA: Diagnosis not present

## 2019-01-27 DIAGNOSIS — Z7901 Long term (current) use of anticoagulants: Secondary | ICD-10-CM

## 2019-01-27 DIAGNOSIS — Z5181 Encounter for therapeutic drug level monitoring: Secondary | ICD-10-CM

## 2019-01-27 LAB — POCT INR: INR: 2.7 (ref 2.0–3.0)

## 2019-01-27 NOTE — Patient Instructions (Signed)
Description   Continue to take 1/2 tablet (1.25 mg) daily except for 1 tablet (2.5 mg) on Fridays. Remain consistent with leafy green vegetables. Recheck INR in 6 weeks. Please call the office with any medication changes or additions [phone].

## 2019-03-10 ENCOUNTER — Ambulatory Visit (INDEPENDENT_AMBULATORY_CARE_PROVIDER_SITE_OTHER): Payer: Medicare Other | Admitting: *Deleted

## 2019-03-10 ENCOUNTER — Other Ambulatory Visit: Payer: Self-pay

## 2019-03-10 DIAGNOSIS — Z5181 Encounter for therapeutic drug level monitoring: Secondary | ICD-10-CM | POA: Diagnosis not present

## 2019-03-10 DIAGNOSIS — I482 Chronic atrial fibrillation, unspecified: Secondary | ICD-10-CM | POA: Diagnosis not present

## 2019-03-10 DIAGNOSIS — Z7901 Long term (current) use of anticoagulants: Secondary | ICD-10-CM | POA: Diagnosis not present

## 2019-03-10 LAB — POCT INR: INR: 2.9 (ref 2.0–3.0)

## 2019-03-10 NOTE — Patient Instructions (Signed)
Description   Continue to take 1/2 tablet (1.25 mg) daily except for 1 tablet (2.5 mg) on Fridays. Remain consistent with leafy green vegetables. Recheck INR in 6 weeks. Please call the office with any medication changes or additions [phone].

## 2019-04-06 ENCOUNTER — Ambulatory Visit: Payer: Medicare Other | Admitting: Cardiology

## 2019-04-21 ENCOUNTER — Ambulatory Visit (INDEPENDENT_AMBULATORY_CARE_PROVIDER_SITE_OTHER): Payer: Medicare Other | Admitting: Pharmacist Clinician (PhC)/ Clinical Pharmacy Specialist

## 2019-04-21 ENCOUNTER — Other Ambulatory Visit: Payer: Self-pay

## 2019-04-21 DIAGNOSIS — Z7901 Long term (current) use of anticoagulants: Secondary | ICD-10-CM | POA: Diagnosis not present

## 2019-04-21 DIAGNOSIS — Z5181 Encounter for therapeutic drug level monitoring: Secondary | ICD-10-CM | POA: Diagnosis not present

## 2019-04-21 DIAGNOSIS — I482 Chronic atrial fibrillation, unspecified: Secondary | ICD-10-CM | POA: Diagnosis not present

## 2019-04-21 LAB — POCT INR: INR: 2 (ref 2.0–3.0)

## 2019-04-28 NOTE — Progress Notes (Signed)
Cardiology Office Note:    Date:  04/30/2019   ID:  Jerome Newman, DOB 1930/05/13, MRN [id number]  PCP:  [name]., MD  Cardiologist:  Shirlee More, MD    Referring MD: [name]., MD    ASSESSMENT:    1. Chronic diastolic congestive heart failure ([place])   2. Chronic atrial fibrillation ([facility])   3. Chronic anticoagulation   4. Status post biventricular pacemaker    PLAN:    In order of problems listed above:  1. Stable compensated he has no orthostatic shifting blood pressure continue his current loop diuretic check renal function potassium 2. Low after AV nodal ablation permanent pacemaker continue anticoagulation 3. Normal pacemaker function followed in device clinic 4. Chronic pain syndrome started nonsteroidal anti-inflammatory drug with PPI therapy 5. Also weakness deconditioning refer to physical therapy   Next appointment: 3 months   Medication Adjustments/Labs and Tests Ordered: Current medicines are reviewed at length with the patient today.  Concerns regarding medicines are outlined above.  No orders of the defined types were placed in this encounter.  No orders of the defined types were placed in this encounter.   Chief Complaint  Patient presents with  . Follow-up    History of Present Illness:    Jerome Newman is a 85 y.o. male with a hx of lung Cancer, CHF, chronic Atrial Fibrillation, AVN ablation and BiV pacemaker, chronic anticoagulation for AF and previous remote DVT and PE  last seen 09/04/2018. Compliance with diet, lifestyle and medications: Yes  Is not doing well with COVID-19 he stopped going to the right he has lost his ability to ambulate stand and do activities he complains of chronic back and lower extremity pain and severe muscle weakness.  He is on a statin will discontinue his wife is asked if he can take a nonsteroidal and a PPI along with his anticoagulant its not my typical approach but I think it is important here for the  quality of his life and will start him on a nonsteroidal anti-inflammatory drug meloxicam.  At their request I will refer him to physical therapy for conditioning and gait training.  His chronic shortness of breath with any activity and has ambulatory oxygen no edema orthopnea chest pain or syncope device function normal followed in our clinic Past Medical History:  Diagnosis Date  . Bladder cancer ([place])   . BPH (benign prostatic hyperplasia)   . GERD (gastroesophageal reflux disease)   . Pancreatitis   . Pneumonia     Past Surgical History:  Procedure Laterality Date  . AV NODE ABLATION  01/18/2016  . BI-VENTRICULAR PACEMAKER INSERTION (CRT-P)    . BLADDER SURGERY     Removed cancer at [facility]  . CARDIOVERSION    . HERNIA REPAIR    . LUNG REMOVAL, PARTIAL Right     Current Medications: Current Meds  Medication Sig  . acetaminophen (TYLENOL) 500 MG tablet Take 1,000 mg by mouth 3 (three) times daily.   Marland Kitchen atorvastatin (LIPITOR) 40 MG tablet TAKE 1 TABLET BY MOUTH DAILY AT 6PM  . pantoprazole (PROTONIX) 40 MG tablet Take 40 mg by mouth 2 (two) times daily.  . potassium chloride SA (K-DUR) 20 MEQ tablet TAKE 1 TABLET BY MOUTH DAILY  . solifenacin (VESICARE) 5 MG tablet Take 5 mg by mouth every other day.  . torsemide (DEMADEX) 10 MG tablet TAKE 1 TABLET BY MOUTH ONCE DAILY  . warfarin (COUMADIN) 2.5 MG tablet Take 1/2 to 1 tablet daily  as directed by Coumadin clinic     Allergies:   Gabapentin and Nsaids   Social History   Socioeconomic History  . Marital status: Married    Spouse name: Not on file  . Number of children: Not on file  . Years of education: Not on file  . Highest education level: Not on file  Occupational History  . Not on file  Social Needs  . Financial resource strain: Not on file  . Food insecurity    Worry: Not on file    Inability: Not on file  . Transportation needs    Medical: Not on file    Non-medical: Not on file  Tobacco Use  . Smoking status:  Former Research scientist (life sciences)  . Smokeless tobacco: Never Used  Substance and Sexual Activity  . Alcohol use: Not Currently  . Drug use: Not Currently  . Sexual activity: Not on file  Lifestyle  . Physical activity    Days per week: Not on file    Minutes per session: Not on file  . Stress: Not on file  Relationships  . Social Herbalist on phone: Not on file    Gets together: Not on file    Attends religious service: Not on file    Active member of club or organization: Not on file    Attends meetings of clubs or organizations: Not on file    Relationship status: Not on file  Other Topics Concern  . Not on file  Social History Narrative  . Not on file     Family History: The patient's family history includes Cancer in his sister; Heart disease in his father. ROS:   Please see the history of present illness.    All other systems reviewed and are negative.  EKGs/Labs/Other Studies Reviewed:    The following studies were reviewed today:  Recent Labs: 08/30/2018: ALT 14 09/01/2018: BUN 16; Creatinine, Ser 0.99; Hemoglobin 10.6; Platelets 100; Potassium 4.0; Sodium 140  Recent Lipid Panel    Component Value Date/Time   CHOL 118 08/30/2018 1718   TRIG 49 08/30/2018 1718   HDL 46 08/30/2018 1718   CHOLHDL 2.6 08/30/2018 1718   VLDL 10 08/30/2018 1718   LDLCALC 62 08/30/2018 1718    Physical Exam:    VS:  BP 122/80 (BP Location: Right Arm, Patient Position: Sitting, Cuff Size: Normal)   Pulse 82   Ht 6' (1.829 m)   Wt 197 lb (89.4 kg)   SpO2 96%   BMI 26.72 kg/m     Wt Readings from Last 3 Encounters:  04/30/19 197 lb (89.4 kg)  09/04/18 202 lb 12.8 oz (92 kg)  08/30/18 198 lb (89.8 kg)     GEN:  Well nourished, well developed in no acute distress HEENT: Normal NECK: No JVD; No carotid bruits LYMPHATICS: No lymphadenopathy CARDIAC: RRR, no murmurs, rubs, gallops RESPIRATORY:  Clear to auscultation without rales, wheezing or rhonchi  ABDOMEN: Soft, non-tender,  non-distended MUSCULOSKELETAL:  No edema; No deformity  SKIN: Warm and dry NEUROLOGIC:  Alert and oriented x 3 PSYCHIATRIC:  Normal affect    Signed, Shirlee More, MD  04/30/2019 10:55 AM    [place]

## 2019-04-30 ENCOUNTER — Ambulatory Visit (INDEPENDENT_AMBULATORY_CARE_PROVIDER_SITE_OTHER): Payer: Medicare Other | Admitting: Cardiology

## 2019-04-30 ENCOUNTER — Encounter: Payer: Self-pay | Admitting: Cardiology

## 2019-04-30 ENCOUNTER — Other Ambulatory Visit: Payer: Self-pay

## 2019-04-30 ENCOUNTER — Other Ambulatory Visit: Payer: Self-pay | Admitting: Cardiology

## 2019-04-30 VITALS — BP 122/80 | HR 82 | Ht 72.0 in | Wt 197.0 lb

## 2019-04-30 DIAGNOSIS — Z950 Presence of cardiac pacemaker: Secondary | ICD-10-CM | POA: Diagnosis not present

## 2019-04-30 DIAGNOSIS — R29898 Other symptoms and signs involving the musculoskeletal system: Secondary | ICD-10-CM

## 2019-04-30 DIAGNOSIS — Z7901 Long term (current) use of anticoagulants: Secondary | ICD-10-CM

## 2019-04-30 DIAGNOSIS — I482 Chronic atrial fibrillation, unspecified: Secondary | ICD-10-CM

## 2019-04-30 DIAGNOSIS — I5032 Chronic diastolic (congestive) heart failure: Secondary | ICD-10-CM | POA: Diagnosis not present

## 2019-04-30 DIAGNOSIS — R2681 Unsteadiness on feet: Secondary | ICD-10-CM

## 2019-04-30 MED ORDER — MELOXICAM 5 MG PO CAPS: 15.0000 mg | ORAL_CAPSULE | Freq: Every day | ORAL | 3 refills | Status: DC

## 2019-04-30 NOTE — Patient Instructions (Signed)
Medication Instructions:  Your physician has recommended you make the following change in your medication:  STOP atorvastatin (lipitor)   START meloxicam (mobic) 5 mg: Take 3 capsules (15 mg) daily  *If you need a refill on your cardiac medications before your next appointment, please call your pharmacy*  Lab Work: Your physician recommends that you return for lab work today: CBC, CMP, TSH.  If you have labs (blood work) drawn today and your tests are completely normal, you will receive your results only by: Marland Kitchen MyChart Message (if you have MyChart) OR . A paper copy in the mail If you have any lab test that is abnormal or we need to change your treatment, we will call you to review the results.  Testing/Procedures: You have been referred for physicial therapy at [facility] in [place]. You will be contacted to set this up.   Follow-Up: At [facility], you and your health needs are our priority.  As part of our continuing mission to provide you with exceptional heart care, we have created designated Provider Care Teams.  These Care Teams include your primary Cardiologist (physician) and Advanced Practice Providers (APPs -  Physician Assistants and Nurse Practitioners) who all work together to provide you with the care you need, when you need it.  Your next appointment:   3 months  The format for your next appointment:   In Person  Provider:   Shirlee More, MD    Meloxicam capsules What is this medicine? MELOXICAM (mel OX i cam) is a non-steroidal anti-inflammatory drug (NSAID). It is used to reduce swelling and to treat pain. It is used for osteoarthritis. This medicine may be used for other purposes; ask your health care provider or pharmacist if you have questions. COMMON BRAND NAME(S): Vivlodex What should I tell my health care provider before I take this medicine? They need to know if you have any of these conditions:  bleeding disorders  cigarette  smoker  coronary artery bypass graft (CABG) surgery within the past 2 weeks  drink more than 3 alcohol-containing drinks per day  heart disease  high blood pressure  history of stomach bleeding  kidney disease  liver disease  lung or breathing disease, like asthma  stomach or intestine problems  an unusual or allergic reaction to meloxicam, aspirin, other NSAIDs, other medicines, foods, dyes, or preservatives  pregnant or trying to get pregnant  breast-feeding How should I use this medicine? Take this medicine by mouth with a full glass of water. Follow the directions on the prescription label. You can take it with or without food. If it upsets your stomach, take it with food. Take your medicine at regular intervals. Do not take it more often than directed. Do not stop taking except on your doctor's advice. A special MedGuide will be given to you by the pharmacist with each prescription and refill. Be sure to read this information carefully each time. Talk to your pediatrician regarding the use of this medicine in children. Special care may be needed. Patients over 61 years old may have a stronger reaction and need a smaller dose. Overdosage: If you think you have taken too much of this medicine contact a poison control center or emergency room at once. NOTE: This medicine is only for you. Do not share this medicine with others. What if I miss a dose? If you miss a dose, take it as soon as you can. If it is almost time for your next dose, take  only that dose. Do not take double or extra doses. What may interact with this medicine? Do not take this medicine with any of the following medications:  cidofovir  ketorolac This medicine may also interact with the following medications:  aspirin and aspirin-like medicines  certain medicines for blood pressure, heart disease, irregular heart beat  certain medicines for depression, anxiety, or psychotic disturbances  certain  medicines that treat or prevent blood clots like warfarin, enoxaparin, dalteparin, apixaban, dabigatran, rivaroxaban  cyclosporine  diuretics  fluconazole  lithium  methotrexate  other NSAIDs, medicines for pain and inflammation, like ibuprofen and naproxen  pemetrexed This list may not describe all possible interactions. Give your health care provider a list of all the medicines, herbs, non-prescription drugs, or dietary supplements you use. Also tell them if you smoke, drink alcohol, or use illegal drugs. Some items may interact with your medicine. What should I watch for while using this medicine? Tell your doctor or healthcare provider if your symptoms do not start to get better or if they get worse. This medicine may cause serious skin reactions. They can happen weeks to months after starting the medicine. Contact your healthcare provider right away if you notice fevers or flu-like symptoms with a rash. The rash may be red or purple and then turn into blisters or peeling of the skin. Or, you might notice a red rash with swelling of the face, lips or lymph nodes in your neck or under your arms. Do not take other medicines that contain aspirin, ibuprofen, or naproxen with this medicine. Side effects such as stomach upset, nausea, or ulcers may be more likely to occur. Many medicines available without a prescription should not be taken with this medicine. This medicine can cause ulcers and bleeding in the stomach and intestines at any time during treatment. This can happen with no warning and may cause death. There is increased risk with taking this medicine for a long time. Smoking, drinking alcohol, older age, and poor health can also increase risks. Call your doctor right away if you have stomach pain or blood in your vomit or stool. This medicine does not prevent heart attack or stroke. In fact, this medicine may increase the chance of a heart attack or stroke. The chance may increase with  longer use of this medicine and in people who have heart disease. If you take aspirin to prevent heart attack or stroke, talk with your doctor or healthcare provider. What side effects may I notice from receiving this medicine? Side effects that you should report to your doctor or health care professional as soon as possible:  allergic reactions like skin rash, itching or hives, swelling of the face, lips, or tongue  nausea, vomiting  redness, blistering, peeling, or loosening of the skin, including inside the mouth  signs and symptoms of a blood clot such as breathing problems; changes in vision; chest pain; severe, sudden headache; pain, swelling, warmth in the leg; trouble speaking; sudden numbness or weakness of the face, arm, or leg  signs and symptoms of bleeding such as bloody or black, tarry stools; red or dark-brown urine; spitting up blood or brown material that looks like coffee grounds; red spots on the skin; unusual bruising or bleeding from the eye, gums, or nose  signs and symptoms of liver injury like dark yellow or brown urine; general ill feeling or flu-like symptoms; light-colored stools; loss of appetite; nausea; right upper belly pain; unusually weak or tired; yellowing of the eyes or  skin  signs and symptoms of stroke like changes in vision; confusion; trouble speaking or understanding; severe headaches; sudden numbness or weakness of the face, arm, or leg; trouble walking; dizziness; loss of balance or coordination Side effects that usually do not require medical attention (report to your doctor or health care professional if they continue or are bothersome):  constipation  diarrhea  gas This list may not describe all possible side effects. Call your doctor for medical advice about side effects. You may report side effects to FDA at 1-800-FDA-1088. Where should I keep my medicine? Keep out of the reach of children. Store at room temperature between 15 and 30 degrees  C (59 and 86 degrees F). Throw away any unused medicine after the expiration date. NOTE: This sheet is a summary. It may not cover all possible information. If you have questions about this medicine, talk to your doctor, pharmacist, or health care provider.  2020 Elsevier/Gold Standard (2018-09-17 11:19:03)

## 2019-04-30 NOTE — Addendum Note (Signed)
Addended by: Austin Miles on: 04/30/2019 11:22 AM   Modules accepted: Orders

## 2019-05-01 LAB — TSH: TSH: 1.45 u[IU]/mL (ref 0.450–4.500)

## 2019-05-01 LAB — COMPREHENSIVE METABOLIC PANEL
ALT: 11 IU/L (ref 0–44)
AST: 17 IU/L (ref 0–40)
Albumin/Globulin Ratio: 1.4 (ref 1.2–2.2)
Albumin: 3.8 g/dL (ref 3.6–4.6)
Alkaline Phosphatase: 164 IU/L — ABNORMAL HIGH (ref 39–117)
BUN/Creatinine Ratio: 15 (ref 10–24)
BUN: 17 mg/dL (ref 8–27)
Bilirubin Total: 0.4 mg/dL (ref 0.0–1.2)
CO2: 24 mmol/L (ref 20–29)
Calcium: 9.7 mg/dL (ref 8.6–10.2)
Chloride: 103 mmol/L (ref 96–106)
Creatinine, Ser: 1.15 mg/dL (ref 0.76–1.27)
GFR calc Af Amer: 66 mL/min/{1.73_m2} (ref >59)
GFR calc non Af Amer: 57 mL/min/{1.73_m2} — ABNORMAL LOW (ref >59)
Globulin, Total: 2.7 g/dL (ref 1.5–4.5)
Glucose: 104 mg/dL — ABNORMAL HIGH (ref 65–99)
Potassium: 5 mmol/L (ref 3.5–5.2)
Sodium: 141 mmol/L (ref 134–144)
Total Protein: 6.5 g/dL (ref 6.0–8.5)

## 2019-05-01 LAB — CBC
Hematocrit: 44.4 % (ref 37.5–51.0)
Hemoglobin: 13.9 g/dL (ref 13.0–17.7)
MCH: 29.1 pg (ref 26.6–33.0)
MCHC: 31.3 g/dL — ABNORMAL LOW (ref 31.5–35.7)
MCV: 93 fL (ref 79–97)
Platelets: 193 10*3/uL (ref 150–450)
RBC: 4.77 x10E6/uL (ref 4.14–5.80)
RDW: 14 % (ref 11.6–15.4)
WBC: 7.3 10*3/uL (ref 3.4–10.8)

## 2019-05-08 ENCOUNTER — Inpatient Hospital Stay (HOSPITAL_COMMUNITY): Payer: Medicare Other

## 2019-05-08 ENCOUNTER — Other Ambulatory Visit: Payer: Self-pay

## 2019-05-08 ENCOUNTER — Emergency Department (HOSPITAL_COMMUNITY): Payer: Medicare Other

## 2019-05-08 ENCOUNTER — Inpatient Hospital Stay (HOSPITAL_COMMUNITY)
Admission: EM | Admit: 2019-05-08 | Discharge: 2019-05-14 | DRG: 481 | Disposition: A | Discharge status: Skilled Nursing Facility | Payer: Medicare Other | Attending: Student in an Organized Health Care Education/Training Program | Admitting: Student in an Organized Health Care Education/Training Program

## 2019-05-08 ENCOUNTER — Encounter (HOSPITAL_COMMUNITY)
Admission: EM | Disposition: A | Discharge status: Skilled Nursing Facility | Payer: Self-pay | Source: Home / Self Care | Attending: Student in an Organized Health Care Education/Training Program

## 2019-05-08 ENCOUNTER — Inpatient Hospital Stay (HOSPITAL_COMMUNITY): Payer: Medicare Other | Admitting: Certified Registered Nurse Anesthetist

## 2019-05-08 ENCOUNTER — Encounter (HOSPITAL_COMMUNITY): Payer: Self-pay | Admitting: Emergency Medicine

## 2019-05-08 DIAGNOSIS — Z8249 Family history of ischemic heart disease and other diseases of the circulatory system: Secondary | ICD-10-CM

## 2019-05-08 DIAGNOSIS — Z9981 Dependence on supplemental oxygen: Secondary | ICD-10-CM

## 2019-05-08 DIAGNOSIS — Z888 Allergy status to other drugs, medicaments and biological substances status: Secondary | ICD-10-CM

## 2019-05-08 DIAGNOSIS — D696 Thrombocytopenia, unspecified: Secondary | ICD-10-CM | POA: Diagnosis not present

## 2019-05-08 DIAGNOSIS — Z8551 Personal history of malignant neoplasm of bladder: Secondary | ICD-10-CM | POA: Diagnosis not present

## 2019-05-08 DIAGNOSIS — Z886 Allergy status to analgesic agent status: Secondary | ICD-10-CM

## 2019-05-08 DIAGNOSIS — N400 Enlarged prostate without lower urinary tract symptoms: Secondary | ICD-10-CM | POA: Diagnosis present

## 2019-05-08 DIAGNOSIS — Z20828 Contact with and (suspected) exposure to other viral communicable diseases: Secondary | ICD-10-CM | POA: Diagnosis present

## 2019-05-08 DIAGNOSIS — E611 Iron deficiency: Secondary | ICD-10-CM | POA: Diagnosis present

## 2019-05-08 DIAGNOSIS — T148XXA Other injury of unspecified body region, initial encounter: Secondary | ICD-10-CM

## 2019-05-08 DIAGNOSIS — T79A21A Traumatic compartment syndrome of right lower extremity, initial encounter: Secondary | ICD-10-CM | POA: Diagnosis present

## 2019-05-08 DIAGNOSIS — I5032 Chronic diastolic (congestive) heart failure: Secondary | ICD-10-CM | POA: Diagnosis present

## 2019-05-08 DIAGNOSIS — D62 Acute posthemorrhagic anemia: Secondary | ICD-10-CM | POA: Diagnosis not present

## 2019-05-08 DIAGNOSIS — Z85118 Personal history of other malignant neoplasm of bronchus and lung: Secondary | ICD-10-CM | POA: Diagnosis not present

## 2019-05-08 DIAGNOSIS — Z902 Acquired absence of lung [part of]: Secondary | ICD-10-CM

## 2019-05-08 DIAGNOSIS — Z79899 Other long term (current) drug therapy: Secondary | ICD-10-CM

## 2019-05-08 DIAGNOSIS — M170 Bilateral primary osteoarthritis of knee: Secondary | ICD-10-CM | POA: Diagnosis present

## 2019-05-08 DIAGNOSIS — J449 Chronic obstructive pulmonary disease, unspecified: Secondary | ICD-10-CM | POA: Diagnosis present

## 2019-05-08 DIAGNOSIS — S82142A Displaced bicondylar fracture of left tibia, initial encounter for closed fracture: Secondary | ICD-10-CM

## 2019-05-08 DIAGNOSIS — T1490XA Injury, unspecified, initial encounter: Secondary | ICD-10-CM

## 2019-05-08 DIAGNOSIS — I2510 Atherosclerotic heart disease of native coronary artery without angina pectoris: Secondary | ICD-10-CM | POA: Diagnosis present

## 2019-05-08 DIAGNOSIS — W19XXXA Unspecified fall, initial encounter: Secondary | ICD-10-CM

## 2019-05-08 DIAGNOSIS — S82101A Unspecified fracture of upper end of right tibia, initial encounter for closed fracture: Secondary | ICD-10-CM | POA: Diagnosis not present

## 2019-05-08 DIAGNOSIS — S82141A Displaced bicondylar fracture of right tibia, initial encounter for closed fracture: Secondary | ICD-10-CM | POA: Diagnosis not present

## 2019-05-08 DIAGNOSIS — H9190 Unspecified hearing loss, unspecified ear: Secondary | ICD-10-CM | POA: Diagnosis present

## 2019-05-08 DIAGNOSIS — S72462A Displaced supracondylar fracture with intracondylar extension of lower end of left femur, initial encounter for closed fracture: Principal | ICD-10-CM | POA: Diagnosis present

## 2019-05-08 DIAGNOSIS — W228XXA Striking against or struck by other objects, initial encounter: Secondary | ICD-10-CM | POA: Diagnosis present

## 2019-05-08 DIAGNOSIS — Z86718 Personal history of other venous thrombosis and embolism: Secondary | ICD-10-CM

## 2019-05-08 DIAGNOSIS — Z87891 Personal history of nicotine dependence: Secondary | ICD-10-CM

## 2019-05-08 DIAGNOSIS — K219 Gastro-esophageal reflux disease without esophagitis: Secondary | ICD-10-CM | POA: Diagnosis present

## 2019-05-08 DIAGNOSIS — W208XXA Other cause of strike by thrown, projected or falling object, initial encounter: Secondary | ICD-10-CM | POA: Diagnosis not present

## 2019-05-08 DIAGNOSIS — Z950 Presence of cardiac pacemaker: Secondary | ICD-10-CM

## 2019-05-08 DIAGNOSIS — Z7901 Long term (current) use of anticoagulants: Secondary | ICD-10-CM

## 2019-05-08 DIAGNOSIS — S72402A Unspecified fracture of lower end of left femur, initial encounter for closed fracture: Secondary | ICD-10-CM | POA: Diagnosis not present

## 2019-05-08 DIAGNOSIS — I482 Chronic atrial fibrillation, unspecified: Secondary | ICD-10-CM | POA: Diagnosis present

## 2019-05-08 DIAGNOSIS — G4733 Obstructive sleep apnea (adult) (pediatric): Secondary | ICD-10-CM | POA: Diagnosis present

## 2019-05-08 DIAGNOSIS — R791 Abnormal coagulation profile: Secondary | ICD-10-CM | POA: Diagnosis present

## 2019-05-08 DIAGNOSIS — Z86711 Personal history of pulmonary embolism: Secondary | ICD-10-CM | POA: Diagnosis not present

## 2019-05-08 DIAGNOSIS — S72492A Other fracture of lower end of left femur, initial encounter for closed fracture: Secondary | ICD-10-CM

## 2019-05-08 DIAGNOSIS — S82191A Other fracture of upper end of right tibia, initial encounter for closed fracture: Secondary | ICD-10-CM

## 2019-05-08 DIAGNOSIS — Z9889 Other specified postprocedural states: Secondary | ICD-10-CM | POA: Diagnosis not present

## 2019-05-08 DIAGNOSIS — R609 Edema, unspecified: Secondary | ICD-10-CM

## 2019-05-08 DIAGNOSIS — R52 Pain, unspecified: Secondary | ICD-10-CM

## 2019-05-08 HISTORY — PX: FASCIOTOMY: SHX132

## 2019-05-08 HISTORY — PX: EXTERNAL FIXATION LEG: SHX1549

## 2019-05-08 HISTORY — DX: Displaced bicondylar fracture of right tibia, initial encounter for closed fracture: S82.141A

## 2019-05-08 LAB — COMPREHENSIVE METABOLIC PANEL
ALT: 14 U/L (ref 0–44)
AST: 21 U/L (ref 15–41)
Albumin: 3.6 g/dL (ref 3.5–5.0)
Alkaline Phosphatase: 109 U/L (ref 38–126)
Anion gap: 13 (ref 5–15)
BUN: 22 mg/dL (ref 8–23)
CO2: 21 mmol/L — ABNORMAL LOW (ref 22–32)
Calcium: 9.3 mg/dL (ref 8.9–10.3)
Chloride: 103 mmol/L (ref 98–111)
Creatinine, Ser: 1.24 mg/dL (ref 0.61–1.24)
GFR calc Af Amer: >60 mL/min (ref >60)
GFR calc non Af Amer: 52 mL/min — ABNORMAL LOW (ref >60)
Glucose, Bld: 118 mg/dL — ABNORMAL HIGH (ref 70–99)
Potassium: 4.4 mmol/L (ref 3.5–5.1)
Sodium: 137 mmol/L (ref 135–145)
Total Bilirubin: 0.7 mg/dL (ref 0.3–1.2)
Total Protein: 6 g/dL — ABNORMAL LOW (ref 6.5–8.1)

## 2019-05-08 LAB — CBC WITH DIFFERENTIAL/PLATELET
Abs Immature Granulocytes: 0.02 10*3/uL (ref 0.00–0.07)
Basophils Absolute: 0 10*3/uL (ref 0.0–0.1)
Basophils Relative: 0 %
Eosinophils Absolute: 0.1 10*3/uL (ref 0.0–0.5)
Eosinophils Relative: 2 %
HCT: 38.2 % — ABNORMAL LOW (ref 39.0–52.0)
Hemoglobin: 12.7 g/dL — ABNORMAL LOW (ref 13.0–17.0)
Immature Granulocytes: 0 %
Lymphocytes Relative: 19 %
Lymphs Abs: 1.4 10*3/uL (ref 0.7–4.0)
MCH: 30.6 pg (ref 26.0–34.0)
MCHC: 33.2 g/dL (ref 30.0–36.0)
MCV: 92 fL (ref 80.0–100.0)
Monocytes Absolute: 0.3 10*3/uL (ref 0.1–1.0)
Monocytes Relative: 4 %
Neutro Abs: 5.7 10*3/uL (ref 1.7–7.7)
Neutrophils Relative %: 75 %
Platelets: 176 10*3/uL (ref 150–400)
RBC: 4.15 MIL/uL — ABNORMAL LOW (ref 4.22–5.81)
RDW: 14.3 % (ref 11.5–15.5)
WBC: 7.5 10*3/uL (ref 4.0–10.5)
nRBC: 0 % (ref 0.0–0.2)

## 2019-05-08 LAB — ABO/RH: ABO/RH(D): O POS

## 2019-05-08 LAB — PROTIME-INR
INR: 3.2 — ABNORMAL HIGH (ref 0.8–1.2)
Prothrombin Time: 32.6 seconds — ABNORMAL HIGH (ref 11.4–15.2)

## 2019-05-08 LAB — SARS CORONAVIRUS 2 BY RT PCR (HOSPITAL ORDER, PERFORMED IN ~~LOC~~ HOSPITAL LAB): SARS Coronavirus 2: NEGATIVE

## 2019-05-08 SURGERY — FASCIOTOMY, UPPER EXTREMITY
Anesthesia: General | Laterality: Right

## 2019-05-08 MED ORDER — ONDANSETRON HCL 4 MG/2ML IJ SOLN
4.0000 mg | Freq: Once | INTRAMUSCULAR | Status: AC
  Administered 2019-05-08: 4 mg via INTRAVENOUS
  Filled 2019-05-08: qty 2

## 2019-05-08 MED ORDER — ACETAMINOPHEN 500 MG PO TABS
1000.0000 mg | ORAL_TABLET | Freq: Four times a day (QID) | ORAL | Status: DC
  Administered 2019-05-08 – 2019-05-14 (×21): 1000 mg via ORAL
  Filled 2019-05-08 (×24): qty 2

## 2019-05-08 MED ORDER — LIDOCAINE 2% (20 MG/ML) 5 ML SYRINGE
INTRAMUSCULAR | Status: DC | PRN
  Administered 2019-05-08: 100 mg via INTRAVENOUS

## 2019-05-08 MED ORDER — PROMETHAZINE HCL 25 MG/ML IJ SOLN: 6.2500 mg | INTRAMUSCULAR | Status: DC | PRN

## 2019-05-08 MED ORDER — CEFAZOLIN SODIUM-DEXTROSE 2-4 GM/100ML-% IV SOLN
2.0000 g | Freq: Three times a day (TID) | INTRAVENOUS | Status: AC
  Administered 2019-05-09 (×3): 2 g via INTRAVENOUS
  Filled 2019-05-08 (×3): qty 100

## 2019-05-08 MED ORDER — FENTANYL CITRATE (PF) 250 MCG/5ML IJ SOLN
INTRAMUSCULAR | Status: AC
  Filled 2019-05-08: qty 5

## 2019-05-08 MED ORDER — SUCCINYLCHOLINE CHLORIDE 200 MG/10ML IV SOSY
PREFILLED_SYRINGE | INTRAVENOUS | Status: DC | PRN
  Administered 2019-05-08: 120 mg via INTRAVENOUS

## 2019-05-08 MED ORDER — CHLORHEXIDINE GLUCONATE 4 % EX LIQD
60.0000 mL | Freq: Once | CUTANEOUS | Status: DC
  Filled 2019-05-08 (×2): qty 60

## 2019-05-08 MED ORDER — LACTATED RINGERS IV SOLN
INTRAVENOUS | Status: DC | PRN
  Administered 2019-05-08: 18:00:00 via INTRAVENOUS

## 2019-05-08 MED ORDER — VANCOMYCIN HCL 1000 MG IV SOLR
INTRAVENOUS | Status: AC
  Filled 2019-05-08: qty 1000

## 2019-05-08 MED ORDER — PANTOPRAZOLE SODIUM 40 MG PO TBEC
40.0000 mg | DELAYED_RELEASE_TABLET | Freq: Two times a day (BID) | ORAL | Status: DC
  Administered 2019-05-08 – 2019-05-14 (×12): 40 mg via ORAL
  Filled 2019-05-08 (×12): qty 1

## 2019-05-08 MED ORDER — SUGAMMADEX SODIUM 200 MG/2ML IV SOLN
INTRAVENOUS | Status: DC | PRN
  Administered 2019-05-08: 200 mg via INTRAVENOUS

## 2019-05-08 MED ORDER — FENTANYL CITRATE (PF) 250 MCG/5ML IJ SOLN
INTRAMUSCULAR | Status: DC | PRN
  Administered 2019-05-08: 25 ug via INTRAVENOUS
  Administered 2019-05-08 (×5): 50 ug via INTRAVENOUS

## 2019-05-08 MED ORDER — VITAMIN K1 10 MG/ML IJ SOLN
5.0000 mg | Freq: Once | INTRAVENOUS | Status: AC
  Administered 2019-05-08: 5 mg via INTRAVENOUS
  Filled 2019-05-08: qty 0.5

## 2019-05-08 MED ORDER — PHENYLEPHRINE HCL-NACL 10-0.9 MG/250ML-% IV SOLN
INTRAVENOUS | Status: DC | PRN
  Administered 2019-05-08: 10 ug/min via INTRAVENOUS

## 2019-05-08 MED ORDER — POVIDONE-IODINE 10 % EX SWAB: 2.0000 "application " | Freq: Once | CUTANEOUS | Status: DC

## 2019-05-08 MED ORDER — VANCOMYCIN HCL 1000 MG IV SOLR
INTRAVENOUS | Status: DC | PRN
  Administered 2019-05-08: 1000 mg via TOPICAL

## 2019-05-08 MED ORDER — CEFAZOLIN SODIUM-DEXTROSE 2-4 GM/100ML-% IV SOLN
2.0000 g | INTRAVENOUS | Status: AC
  Administered 2019-05-08: 2 g via INTRAVENOUS

## 2019-05-08 MED ORDER — FENTANYL CITRATE (PF) 100 MCG/2ML IJ SOLN
25.0000 ug | INTRAMUSCULAR | Status: DC | PRN
  Administered 2019-05-08 (×2): 50 ug via INTRAVENOUS

## 2019-05-08 MED ORDER — CEFAZOLIN SODIUM-DEXTROSE 2-4 GM/100ML-% IV SOLN
INTRAVENOUS | Status: AC
  Filled 2019-05-08: qty 100

## 2019-05-08 MED ORDER — PHENYLEPHRINE 40 MCG/ML (10ML) SYRINGE FOR IV PUSH (FOR BLOOD PRESSURE SUPPORT)
PREFILLED_SYRINGE | INTRAVENOUS | Status: DC | PRN
  Administered 2019-05-08 (×2): 40 ug via INTRAVENOUS
  Administered 2019-05-08: 80 ug via INTRAVENOUS
  Administered 2019-05-08: 40 ug via INTRAVENOUS

## 2019-05-08 MED ORDER — 0.9 % SODIUM CHLORIDE (POUR BTL) OPTIME
TOPICAL | Status: DC | PRN
  Administered 2019-05-08: 19:00:00 1000 mL

## 2019-05-08 MED ORDER — FENTANYL CITRATE (PF) 100 MCG/2ML IJ SOLN
100.0000 ug | Freq: Once | INTRAMUSCULAR | Status: AC
  Administered 2019-05-08: 100 ug via INTRAVENOUS
  Filled 2019-05-08: qty 2

## 2019-05-08 MED ORDER — DEXAMETHASONE SODIUM PHOSPHATE 10 MG/ML IJ SOLN
INTRAMUSCULAR | Status: DC | PRN
  Administered 2019-05-08: 4 mg via INTRAVENOUS

## 2019-05-08 MED ORDER — ETOMIDATE 2 MG/ML IV SOLN
INTRAVENOUS | Status: DC | PRN
  Administered 2019-05-08: 20 mg via INTRAVENOUS

## 2019-05-08 MED ORDER — TOBRAMYCIN SULFATE 1.2 G IJ SOLR
INTRAMUSCULAR | Status: AC
  Filled 2019-05-08: qty 1.2

## 2019-05-08 MED ORDER — HYDROMORPHONE HCL 1 MG/ML IJ SOLN
1.0000 mg | INTRAMUSCULAR | Status: DC | PRN
  Administered 2019-05-09 – 2019-05-10 (×6): 1 mg via INTRAVENOUS
  Filled 2019-05-08 (×6): qty 1

## 2019-05-08 MED ORDER — ONDANSETRON HCL 4 MG/2ML IJ SOLN
INTRAMUSCULAR | Status: DC | PRN
  Administered 2019-05-08: 4 mg via INTRAVENOUS

## 2019-05-08 MED ORDER — FENTANYL CITRATE (PF) 100 MCG/2ML IJ SOLN
INTRAMUSCULAR | Status: AC
  Filled 2019-05-08: qty 2

## 2019-05-08 MED ORDER — TRAMADOL HCL 50 MG PO TABS
50.0000 mg | ORAL_TABLET | Freq: Four times a day (QID) | ORAL | Status: DC | PRN
  Administered 2019-05-09: 50 mg via ORAL
  Administered 2019-05-10 – 2019-05-14 (×7): 100 mg via ORAL
  Filled 2019-05-08 (×3): qty 2
  Filled 2019-05-08: qty 1
  Filled 2019-05-08 (×4): qty 2

## 2019-05-08 MED ORDER — ALBUMIN HUMAN 5 % IV SOLN
INTRAVENOUS | Status: DC | PRN
  Administered 2019-05-08: 20:00:00 via INTRAVENOUS

## 2019-05-08 MED ORDER — ROCURONIUM BROMIDE 10 MG/ML (PF) SYRINGE
PREFILLED_SYRINGE | INTRAVENOUS | Status: DC | PRN
  Administered 2019-05-08: 60 mg via INTRAVENOUS
  Administered 2019-05-08: 20 mg via INTRAVENOUS

## 2019-05-08 MED ORDER — SODIUM CHLORIDE 0.9% IV SOLUTION
Freq: Once | INTRAVENOUS | Status: AC
  Administered 2019-05-08: 17:00:00 via INTRAVENOUS

## 2019-05-08 SURGICAL SUPPLY — 87 items
APL PRP STRL LF DISP 70% ISPRP (MISCELLANEOUS) ×2
BIT DRILL 4.3 (BIT) ×3
BIT DRILL 4.3MM (BIT) ×1
BIT DRILL 4.3X300MM (BIT) IMPLANT
BIT DRILL LONG 3.3 (BIT) ×1 IMPLANT
BIT DRILL LONG 3.3MM (BIT) ×1
BIT DRILL QC 3.3X195 (BIT) ×2 IMPLANT
BIT DRILL QC 3.5X195 (BIT) ×2 IMPLANT
BNDG CMPR MED 15X6 ELC VLCR LF (GAUZE/BANDAGES/DRESSINGS) ×2
BNDG COHESIVE 4X5 TAN STRL (GAUZE/BANDAGES/DRESSINGS) ×4 IMPLANT
BNDG ELASTIC 4X5.8 VLCR STR LF (GAUZE/BANDAGES/DRESSINGS) ×4 IMPLANT
BNDG ELASTIC 6X15 VLCR STRL LF (GAUZE/BANDAGES/DRESSINGS) ×2 IMPLANT
BNDG ELASTIC 6X5.8 VLCR STR LF (GAUZE/BANDAGES/DRESSINGS) ×4 IMPLANT
BNDG GAUZE ELAST 4 BULKY (GAUZE/BANDAGES/DRESSINGS) ×6 IMPLANT
BRUSH SCRUB EZ PLAIN DRY (MISCELLANEOUS) ×8 IMPLANT
CANISTER WOUNDNEG PRESSURE 500 (CANNISTER) ×2 IMPLANT
CAP LOCK NCB (Cap) ×16 IMPLANT
CHLORAPREP W/TINT 26 (MISCELLANEOUS) ×4 IMPLANT
CLAMP PIN LRG 6 POSITION (Clamp) ×4 IMPLANT
CLAMP ROD ATTACHMENT (Clamp) ×4 IMPLANT
CLOSURE WOUND 1/2 X4 (GAUZE/BANDAGES/DRESSINGS)
CONNECTOR Y ATS VAC SYSTEM (MISCELLANEOUS) ×2 IMPLANT
COVER SURGICAL LIGHT HANDLE (MISCELLANEOUS) ×8 IMPLANT
CUFF TOURN SGL QUICK 34 (TOURNIQUET CUFF)
CUFF TRNQT CYL 34X4.125X (TOURNIQUET CUFF) IMPLANT
DRAPE C-ARM 42X72 X-RAY (DRAPES) ×4 IMPLANT
DRAPE C-ARMOR (DRAPES) ×4 IMPLANT
DRAPE EXTREMITY BILATERAL (DRAPES) ×2 IMPLANT
DRAPE IMP U-DRAPE 54X76 (DRAPES) ×8 IMPLANT
DRAPE INCISE IOBAN 66X45 STRL (DRAPES) ×4 IMPLANT
DRAPE ORTHO SPLIT 77X108 STRL (DRAPES) ×8
DRAPE SURG ORHT 6 SPLT 77X108 (DRAPES) ×4 IMPLANT
DRAPE U-SHAPE 47X51 STRL (DRAPES) ×4 IMPLANT
DRSG ADAPTIC 3X8 NADH LF (GAUZE/BANDAGES/DRESSINGS) ×4 IMPLANT
DRSG MEPILEX BORDER 4X8 (GAUZE/BANDAGES/DRESSINGS) ×4 IMPLANT
DRSG MEPITEL 4X7.2 (GAUZE/BANDAGES/DRESSINGS) IMPLANT
DRSG PAD ABDOMINAL 8X10 ST (GAUZE/BANDAGES/DRESSINGS) ×4 IMPLANT
DRSG VAC ATS MED SENSATRAC (GAUZE/BANDAGES/DRESSINGS) ×2 IMPLANT
DRSG VAC ATS SM SENSATRAC (GAUZE/BANDAGES/DRESSINGS) IMPLANT
ELECT REM PT RETURN 9FT ADLT (ELECTROSURGICAL) ×4
ELECTRODE REM PT RTRN 9FT ADLT (ELECTROSURGICAL) ×2 IMPLANT
GAUZE SPONGE 4X4 12PLY STRL (GAUZE/BANDAGES/DRESSINGS) ×4 IMPLANT
GLOVE BIO SURGEON STRL SZ 6.5 (GLOVE) ×9 IMPLANT
GLOVE BIO SURGEON STRL SZ7.5 (GLOVE) ×16 IMPLANT
GLOVE BIO SURGEONS STRL SZ 6.5 (GLOVE) ×3
GLOVE BIOGEL PI IND STRL 6.5 (GLOVE) ×2 IMPLANT
GLOVE BIOGEL PI IND STRL 7.5 (GLOVE) ×2 IMPLANT
GLOVE BIOGEL PI INDICATOR 6.5 (GLOVE) ×2
GLOVE BIOGEL PI INDICATOR 7.5 (GLOVE) ×4
GOWN STRL REUS W/ TWL LRG LVL3 (GOWN DISPOSABLE) ×4 IMPLANT
GOWN STRL REUS W/TWL LRG LVL3 (GOWN DISPOSABLE) ×8
K-WIRE 2.0 (WIRE) ×12
K-WIRE FXSTD 280X2XNS SS (WIRE) ×6
KIT BASIN OR (CUSTOM PROCEDURE TRAY) ×4 IMPLANT
KIT TURNOVER KIT B (KITS) ×4 IMPLANT
KWIRE FXSTD 280X2XNS SS (WIRE) IMPLANT
MANIFOLD NEPTUNE II (INSTRUMENTS) ×4 IMPLANT
NEEDLE 22X1 1/2 (OR ONLY) (NEEDLE) IMPLANT
NS IRRIG 1000ML POUR BTL (IV SOLUTION) ×4 IMPLANT
PACK GENERAL/GYN (CUSTOM PROCEDURE TRAY) ×4 IMPLANT
PACK ORTHO EXTREMITY (CUSTOM PROCEDURE TRAY) ×4 IMPLANT
PAD ARMBOARD 7.5X6 YLW CONV (MISCELLANEOUS) ×8 IMPLANT
PAD CAST 4YDX4 CTTN HI CHSV (CAST SUPPLIES) IMPLANT
PADDING CAST COTTON 4X4 STRL (CAST SUPPLIES) ×8
PADDING CAST COTTON 6X4 STRL (CAST SUPPLIES) ×8 IMPLANT
PLATE DIST FEM 12H (Plate) ×2 IMPLANT
ROD CARBON FIBER 500MM (Rod) ×4 IMPLANT
SCREW CORTICAL 5.0X95MM (Screw) ×2 IMPLANT
SCREW CORTICAL NCB 5.0X90MM (Screw) ×2 IMPLANT
SCREW FEM ST NCB 5X100 (Screw) ×2 IMPLANT
SCREW NCB 4.0MX44M (Screw) ×2 IMPLANT
SCREW NCB 5.0X46MM (Screw) ×4 IMPLANT
SCREW NCB 5.0X85MM (Screw) ×4 IMPLANT
SET MONITOR QUICK PRESSURE (MISCELLANEOUS) IMPLANT
SPONGE LAP 18X18 RF (DISPOSABLE) ×4 IMPLANT
STAPLER VISISTAT 35W (STAPLE) ×4 IMPLANT
STOCKINETTE IMPERVIOUS 9X36 MD (GAUZE/BANDAGES/DRESSINGS) ×4 IMPLANT
STRIP CLOSURE SKIN 1/2X4 (GAUZE/BANDAGES/DRESSINGS) IMPLANT
SUT PROLENE 0 CT (SUTURE) IMPLANT
SUT VIC AB 0 CT1 27 (SUTURE)
SUT VIC AB 0 CT1 27XBRD ANBCTR (SUTURE) IMPLANT
SUT VIC AB 2-0 CT3 27 (SUTURE) IMPLANT
SUT VIC AB 2-0 CTB1 (SUTURE) IMPLANT
TOWEL GREEN STERILE (TOWEL DISPOSABLE) ×10 IMPLANT
TOWEL GREEN STERILE FF (TOWEL DISPOSABLE) ×8 IMPLANT
UNDERPAD 30X30 (UNDERPADS AND DIAPERS) ×6 IMPLANT
WATER STERILE IRR 1000ML POUR (IV SOLUTION) ×4 IMPLANT

## 2019-05-08 NOTE — Transfer of Care (Signed)
Immediate Anesthesia Transfer of Care Note  Patient: Jerome Newman  Procedure(s) Performed: FASCIOTOMY (Right ) EXTERNAL FIXATION LEG (Bilateral )  Patient Location: PACU  Anesthesia Type:General  Level of Consciousness: awake, drowsy and patient cooperative  Airway & Oxygen Therapy: Patient Spontanous Breathing  Post-op Assessment: Report given to RN and Post -op Vital signs reviewed and stable  Post vital signs: Reviewed and stable  Last Vitals:  Vitals Value Taken Time  BP 141/90 05/08/19 2102  Temp    Pulse 82 05/08/19 2114  Resp 18 05/08/19 2114  SpO2 100 % 05/08/19 2114  Vitals shown include unvalidated device data.  Last Pain:  Vitals:   05/08/19 1800  TempSrc:   PainSc: 7       Patients Stated Pain Goal: 2 (87/57/97 2820)  Complications: No apparent anesthesia complications

## 2019-05-08 NOTE — Anesthesia Postprocedure Evaluation (Signed)
Anesthesia Post Note  Patient: Jerome Newman  Procedure(s) Performed: FASCIOTOMY (Right ) EXTERNAL FIXATION LEG (Bilateral )     Patient location during evaluation: PACU Anesthesia Type: General Level of consciousness: sedated Pain management: pain level controlled Vital Signs Assessment: post-procedure vital signs reviewed and stable Respiratory status: spontaneous breathing and respiratory function stable Cardiovascular status: stable Postop Assessment: no apparent nausea or vomiting Anesthetic complications: no    Last Vitals:  Vitals:   05/08/19 2145 05/08/19 2200  BP: 126/82 (!) 143/67  Pulse: 84 83  Resp: (!) 21 (!) 33  Temp: 36.9 C   SpO2: 100% 93%    Last Pain:  Vitals:   05/08/19 2145  TempSrc:   PainSc: [place]

## 2019-05-08 NOTE — ED Provider Notes (Signed)
[facility] EMERGENCY DEPARTMENT Provider Note   CSN: [id number] Arrival date & time: 05/08/19  1220     History   Chief Complaint Chief Complaint  Patient presents with   Fall   Leg Pain    HPI Jerome Newman is a 85 y.o. male.     HPI   84 year old male with history of bladder cancer, diastolic heart failure, obstructive sleep apnea, chronic atrial fibrillation on Coumadin, coronary artery disease, COPD, DVT and PE, status post AV nodal ablation and biventricular pacemaker, who presents with concern for significant pain to his bilateral knees after being struck with a large log.  Patient reports that he was working around the yard, and tripped as he was getting out of the Programmer, systems.  Reports that the there was a log on the skid steer with a large diameter, that rolled off, striking him in the legs.  "Like getting tackled by a football player."  He reports that he fell down, but did not hit his head or lose consciousness.  Denies neck pain, chest pain, back pain, abdominal pain, pelvic pain.  Reports that his injuries are isolated to the bilateral knees.  Reports he has chronic right lower extremity numbness from prior blood clot that is unchanged.  Denies any new numbness.  Reports severe pain to the right knee with some extension down the leg, as well as significant pain to the left knee and inability to move that leg.  Reports left knee pain greater than right.  Pt hard of hearing.  Past Medical History:  Diagnosis Date   Bladder cancer ([place])    BPH (benign prostatic hyperplasia)    GERD (gastroesophageal reflux disease)    Pancreatitis    Pneumonia     Patient Active Problem List   Diagnosis Date Noted   Tibia/fibula fracture 05/08/2019   Acute pancreatitis 08/30/2018   Encounter for therapeutic drug monitoring 40/98/1191   Diastolic congestive heart failure ([place]) 12/31/2016   Hyperlipidemia 12/31/2016   OSA (obstructive sleep apnea)  12/31/2016   Oxygen dependent 12/31/2016   Chronic anticoagulation 12/31/2016   Chronic atrial fibrillation ([place]) 12/31/2016   Lung fibrosis ([place]) 06/01/2016   Status post biventricular pacemaker 01/18/2016   S/P AV nodal ablation 01/18/2016   CAD (coronary artery disease) 09/28/2015   COPD, mild ([place]) 09/28/2015   Malignant 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 Authorizing Provider  acetaminophen (TYLENOL) 500 MG tablet Take 1,000 mg by mouth 2 (two) times daily.    Yes [provider]  meloxicam (MOBIC) 15 MG tablet Take 15 mg by mouth daily. 04/30/19  Yes [provider]  OXYGEN Inhale 2 L/min into the lungs at bedtime.   Yes [provider]  pantoprazole (PROTONIX) 40 MG tablet Take 40 mg by mouth 2 (two) times daily. 12/27/16  Yes [provider]  Polyethyl Glyc-Propyl Glyc PF (SYSTANE PRESERVATIVE FREE) 0.4-0.3 % SOLN Place 1-2 drops into  both eyes 3 (three) times daily as needed (for dryness).   Yes [provider]  potassium chloride SA (K-DUR) 20 MEQ tablet TAKE 1 TABLET BY MOUTH DAILY Patient taking differently: Take 20 mEq by mouth daily.  10/31/18  Yes Richardo Priest, MD  solifenacin (VESICARE) 5 MG tablet Take 5 mg by mouth every other day.   Yes [provider]  torsemide (DEMADEX) 10 MG tablet TAKE 1 TABLET BY MOUTH ONCE DAILY Patient taking differently: Take 10 mg by mouth daily.  10/31/18  Yes Richardo Priest, MD  warfarin (COUMADIN) 2.5 MG tablet TAKE 1/2  TO 1 TABLET BY MOUTH DAILY AS DIRECTED BY COUMADIN CLINIC Patient taking differently: Take 2.5-3.125 mg by mouth See admin instructions. Take 3.125 mg by mouth at bedtime on Sun/Mon/Tues/Wed/Thurs/Sat and 2.5 mg on Fri 05/05/19  Yes Munley, Hilton Cork, MD  Meloxicam 5 MG CAPS Take 15 mg by mouth daily. Patient not taking: Reported on 05/08/2019 04/30/19   Richardo Priest, MD    Family History Family History  Problem Relation Age of Onset   Heart disease Father    Cancer Sister     Social History Social History   Tobacco Use   Smoking status: Former Smoker   Smokeless tobacco: Never Used  Substance Use Topics   Alcohol use: Not Currently   Drug use: Not Currently     Allergies   Gabapentin and Nsaids   Review of Systems Review of Systems  Constitutional: Negative for fever.  Respiratory: Negative for cough and shortness of breath.   Cardiovascular: Negative for chest pain.  Gastrointestinal: Negative for abdominal pain.  Musculoskeletal: Positive for arthralgias and gait problem. Negative for back pain and neck pain.  Skin: Positive for wound (abrasions).  Neurological: Negative for syncope and headaches.     Physical Exam Updated Vital Signs BP (!) 147/76 (BP Location: Right Arm)    Pulse 79    Temp (!) 97.4 F (36.3 C) (Oral)    Resp 17    Ht 6' (1.829 m)    Wt 89.4 kg    SpO2 96%    BMI 26.72 kg/m   Physical Exam Vitals signs and nursing note reviewed.  Constitutional:      General: He is not in acute distress.    Appearance: He is well-developed. He is not diaphoretic.  HENT:     Head: Normocephalic and atraumatic.  Eyes:     Conjunctiva/sclera: Conjunctivae normal.  Neck:     Musculoskeletal: Normal range of motion. No muscular tenderness.  Cardiovascular:     Rate and Rhythm: Normal rate and regular rhythm.  Pulmonary:     Effort: Pulmonary effort is normal. No respiratory distress.  Chest:     Chest wall: Tenderness present.  Abdominal:      General: There is no distension.     Palpations: Abdomen is soft.     Tenderness: There is no abdominal tenderness. There is no guarding.  Musculoskeletal:     Right knee: He exhibits decreased range of motion, swelling (over proximal tibia) and ecchymosis (abrasion). Tenderness found.     Left knee: He exhibits decreased range of motion, swelling and effusion. Tenderness found.     Right ankle: He exhibits normal range of motion and no swelling.     Left ankle: He exhibits normal range of motion and no swelling.  Skin:    General: Skin is warm and dry.  Neurological:     Mental Status: He is  alert and oriented to person, place, and time.      ED Treatments / Results  Labs (all labs ordered are listed, but only abnormal results are displayed) Labs Reviewed  CBC WITH DIFFERENTIAL/PLATELET - Abnormal; Notable for the following components:      Result Value   RBC 4.15 (*)    Hemoglobin 12.7 (*)    HCT 38.2 (*)    All other components within normal limits  COMPREHENSIVE METABOLIC PANEL - Abnormal; Notable for the following components:   CO2 21 (*)    Glucose, Bld 118 (*)    Total Protein 6.0 (*)    GFR calc non Af Amer 52 (*)    All other components within normal limits  PROTIME-INR - Abnormal; Notable for the following components:   Prothrombin Time 32.6 (*)    INR 3.2 (*)    All other components within normal limits  SARS CORONAVIRUS 2 BY RT PCR (HOSPITAL ORDER, [place] [facility])  BASIC METABOLIC PANEL  CBC  PROTIME-INR  PREPARE FRESH FROZEN PLASMA  TYPE AND SCREEN  ABO/RH  PREPARE FRESH FROZEN PLASMA    EKG None  Radiology Ct Head Wo Contrast  Result Date: 05/08/2019 CLINICAL DATA:  Fall, no loss of consciousness, on Coumadin EXAM: CT HEAD WITHOUT CONTRAST TECHNIQUE: Contiguous axial images were obtained from the base of the skull through the vertex without intravenous contrast. COMPARISON:  CT head Oct 31, 2017 FINDINGS: Brain: No evidence of  acute infarction, hemorrhage, hydrocephalus, extra-axial collection or mass lesion/mass effect. Symmetric prominence of the ventricles, cisterns and sulci compatible with parenchymal volume loss. Patchy areas of white matter hypoattenuation are most compatible with chronic microvascular angiopathy. Benign dural calcifications. Vascular: Atherosclerotic calcification of the carotid siphons. No hyperdense vessel. Skull: No calvarial fracture or suspicious osseous lesion. No scalp swelling or hematoma. Sinuses/Orbits: Paranasal sinuses and mastoid air cells are predominantly clear. Orbital structures are unremarkable aside from prior lens extractions. Other: None IMPRESSION: No acute intracranial abnormality. Generalized parenchymal volume loss and chronic microvascular angiopathy. Electronically Signed   By: Lovena Le M.D.   On: 05/08/2019 15:18   Ct Knee Right Wo Contrast  Result Date: 05/08/2019 CLINICAL DATA:  Tibial plateau fracture. EXAM: CT OF THE RIGHT KNEE WITHOUT CONTRAST TECHNIQUE: Multidetector CT imaging of the right knee was performed according to the standard protocol. Multiplanar CT image reconstructions were also generated. COMPARISON:  Right knee x-rays from same day. FINDINGS: Bones/Joint/Cartilage Acute comminuted fracture of the proximal tibia with transverse component through the metaphysis and longitudinal component through the lateral tibial plateau. No extension into the medial tibial plateau. Slight lateral displacement of the dominant fragment. Acute nondisplaced fracture of the anterior fibular head. Moderate to severe medial and moderate lateral patellofemoral compartment joint space narrowing. Tricompartmental osteophytes. Moderate lipohemarthrosis. Moderate Baker cyst with small amount of layering hemorrhage. Osteopenia. Ligaments Ligaments are suboptimally evaluated by CT. Muscles and Tendons Grossly intact. Soft tissue Soft tissue swelling. No fluid collection or hematoma. No  soft tissue mass. IMPRESSION: 1. Acute split fracture of the lateral tibial plateau with nondisplaced transverse fracture of the tibial metaphysis. 2. Acute nondisplaced fracture of the anterior fibular head. 3. Moderate lipohemarthrosis. 4. 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 Electronically Signed   By: Titus Dubin M.D.   On: 05/08/2019 16:32   Dg Knee Complete 4 Views Left  Result Date: 05/08/2019 CLINICAL DATA:  Left knee pain after a log rolled over his legs today. EXAM: LEFT  KNEE - COMPLETE 4+ VIEW COMPARISON:  None. FINDINGS: Comminuted distal femur fracture at the metaphysis with posterior displacement and angulation of the distal fragment as well as some overlapping of the fragments. Marked lateral joint space narrowing. No visible effusion. Atheromatous arterial calcifications. The bones are osteopenic. IMPRESSION: 1. Comminuted distal femur fracture with posterior displacement and angulation. 2. Marked lateral knee joint space narrowing. Electronically Signed   By: Claudie Revering M.D.   On: 05/08/2019 14:31   Dg Knee Complete 4 Views Right  Result Date: 05/08/2019 CLINICAL DATA:  Right knee pain after a log rolled across his legs today. EXAM: RIGHT KNEE - COMPLETE 4+ VIEW COMPARISON:  None. FINDINGS: Comminuted fracture of the proximal tibia involving the metaphysis and epiphysis, extending into the joint space laterally. This is involving the medial and lateral tibial plateaus. No significant displacement or angulation. Small to moderate-sized effusion. Moderate patellofemoral and lateral compartment spur formation. Moderate to marked medial joint space narrowing and associated mild spur formation. Atheromatous arterial calcifications. IMPRESSION: 1. Comminuted proximal tibia fracture with intra-articular extension. 2. Small to moderate-sized effusion. 3. Tricompartmental degenerative changes. Electronically Signed   By: Claudie Revering M.D.   On:  05/08/2019 14:33    Procedures Procedures (including critical care time)  Medications Ordered in ED Medications  chlorhexidine (HIBICLENS) 4 % liquid 4 application ( Topical MAR Hold 05/08/19 1759)  povidone-iodine 10 % swab 2 application ( Topical MAR Hold 05/08/19 1759)  0.9 % irrigation (POUR BTL) (1,000 mLs Irrigation Given 05/08/19 1911)  fentaNYL (SUBLIMAZE) injection 100 mcg (100 mcg Intravenous Given 05/08/19 1317)  fentaNYL (SUBLIMAZE) injection 100 mcg (100 mcg Intravenous Given 05/08/19 1451)  ondansetron (ZOFRAN) injection 4 mg (4 mg Intravenous Given 05/08/19 1530)  phytonadione (VITAMIN K) 5 mg in dextrose 5 % 50 mL IVPB (5 mg Intravenous New Bag/Given 05/08/19 1715)  0.9 %  sodium chloride infusion (Manually program via Guardrails IV Fluids) ( Intravenous New Bag/Given 05/08/19 1718)  fentaNYL (SUBLIMAZE) injection 100 mcg (100 mcg Intravenous Given 05/08/19 1630)  ceFAZolin (ANCEF) IVPB 2g/100 mL premix ( Intravenous Automatically Held 05/09/19 0600)  ondansetron (ZOFRAN) injection 4 mg (4 mg Intravenous Given 05/08/19 1710)  ceFAZolin (ANCEF) 2-4 GM/100ML-% IVPB (  Override pull for Anesthesia 05/08/19 1836)  fentaNYL (SUBLIMAZE) 250 MCG/5ML injection (has no administration in time range)  tobramycin (NEBCIN) 1.2 g powder (has no administration in time range)  vancomycin (VANCOCIN) 1000 MG powder (has no administration in time range)  fentaNYL (SUBLIMAZE) 250 MCG/5ML injection (has no administration in time range)     Initial Impression / Assessment and Plan / ED Course  I have reviewed the triage vital signs and the nursing notes.  Pertinent labs & imaging results that were available during my care of the patient were reviewed by me and considered in my medical decision making (see chart for details).        85 year old male with history of bladder cancer, diastolic heart failure, obstructive sleep apnea, chronic atrial fibrillation on Coumadin, coronary artery disease,  COPD, DVT and PE, status post AV nodal ablation and biventricular pacemaker, who presents with concern for significant pain to his bilateral knees after being struck with a large log that rolled off of his skid steer.  CT head done given pt on anticoagulation with fall. Given mechanism, no neck pain, no new numbness, doubt CSpine injury, no sign of other intrathoracic, intraabdominal or spinal injury.   Trauma was to the bilateral knees specifically without other areas involved.  Pt NV intact (reports chronic right sided lower extremity numbness.)  Swelling to proximal tibia area, however pain worse on left and initially ordered XR for further evaluation prior to consulting Orthopedics.  XR shows distal femur fracture on left, proximal tibia fracture on the right.  Given pain medication and nausea medication. Given multiple medical problems and no other evidence of trauma, will admit to medicine for further care.  Orthopedics consulted, Gustavo Lah PA-C at bedside, plan to take patient to the OR for fasciotomy with concern for developing compartment syndrome of right leg and possible ex fix of RLE and either ex fix or ORIF of distal femur fracture.   Final Clinical Impressions(s) / ED Diagnoses   Final diagnoses:  Other closed fracture of proximal end of right tibia, initial encounter  Other closed fracture of distal end of left femur, initial encounter [facility])  Traumatic compartment syndrome of right lower extremity, initial encounter [facility])  Fall, initial encounter    ED Discharge Orders    None       Gareth Morgan, MD 05/08/19 2022

## 2019-05-08 NOTE — Consult Note (Addendum)
Reason for Consult:Bilateral knee fxs Referring Physician: E Cayton Cuevas is an 85 y.o. male.  HPI: Jerome Newman was using a Programmer, systems when he tripped getting off of it. Then a large log rolled off and struck him in the legs. He had severe pain in both of his legs and could not get up. He was brought to the ED where x-rays showed bilateral knee fxs and orthopedic surgery was consulted.  Past Medical History:  Diagnosis Date  . Bladder cancer ([place])   . BPH (benign prostatic hyperplasia)   . GERD (gastroesophageal reflux disease)   . Pancreatitis   . Pneumonia     Past Surgical History:  Procedure Laterality Date  . AV NODE ABLATION  01/18/2016  . BI-VENTRICULAR PACEMAKER INSERTION (CRT-P)    . BLADDER SURGERY     Removed cancer at [facility]  . CARDIOVERSION    . HERNIA REPAIR    . LUNG REMOVAL, PARTIAL Right     Family History  Problem Relation Age of Onset  . Heart disease Father   . Cancer Sister     Social History:  reports that he has quit smoking. He has never used smokeless tobacco. He reports previous alcohol use. He reports previous drug use.  Allergies:  Allergies  Allergen Reactions  . Gabapentin Other (See Comments)    drowsiness  . Nsaids     NOT SUPPOSED TO TAKE DUE TO BLOOD THINER MEDS    Medications: I have reviewed the patient's current medications.  Results for orders placed or performed during the hospital encounter of 05/08/19 (from the past 48 hour(s))  CBC with Differential     Status: Abnormal   Collection Time: 05/08/19  1:10 PM  Result Value Ref Range   WBC 7.5 4.0 - 10.5 K/uL   RBC 4.15 (L) 4.22 - 5.81 MIL/uL   Hemoglobin 12.7 (L) 13.0 - 17.0 g/dL   HCT 38.2 (L) 39.0 - 52.0 %   MCV 92.0 80.0 - 100.0 fL   MCH 30.6 26.0 - 34.0 pg   MCHC 33.2 30.0 - 36.0 g/dL   RDW 14.3 11.5 - 15.5 %   Platelets 176 150 - 400 K/uL   nRBC 0.0 0.0 - 0.2 %   Neutrophils Relative % 75 %   Neutro Abs 5.7 1.7 - 7.7 K/uL   Lymphocytes Relative 19 %   Lymphs  Abs 1.4 0.7 - 4.0 K/uL   Monocytes Relative 4 %   Monocytes Absolute 0.3 0.1 - 1.0 K/uL   Eosinophils Relative 2 %   Eosinophils Absolute 0.1 0.0 - 0.5 K/uL   Basophils Relative 0 %   Basophils Absolute 0.0 0.0 - 0.1 K/uL   Immature Granulocytes 0 %   Abs Immature Granulocytes 0.02 0.00 - 0.07 K/uL    Comment: Performed at [facility], [address], [place], [place] [address]  Comprehensive metabolic panel     Status: Abnormal   Collection Time: 05/08/19  1:10 PM  Result Value Ref Range   Sodium 137 135 - 145 mmol/L   Potassium 4.4 3.5 - 5.1 mmol/L   Chloride 103 98 - 111 mmol/L   CO2 21 (L) 22 - 32 mmol/L   Glucose, Bld 118 (H) 70 - 99 mg/dL   BUN 22 8 - 23 mg/dL   Creatinine, Ser 1.24 0.61 - 1.24 mg/dL   Calcium 9.3 8.9 - 10.3 mg/dL   Total Protein 6.0 (L) 6.5 - 8.1 g/dL   Albumin 3.6 3.5 -  5.0 g/dL   AST 21 15 - 41 U/L   ALT 14 0 - 44 U/L   Alkaline Phosphatase 109 38 - 126 U/L   Total Bilirubin 0.7 0.3 - 1.2 mg/dL   GFR calc non Af Amer 52 (L) >60 mL/min   GFR calc Af Amer >60 >60 mL/min   Anion gap 13 5 - 15    Comment: Performed at [facility] [address], [place], [place] [address]  Protime-INR     Status: Abnormal   Collection Time: 05/08/19  1:10 PM  Result Value Ref Range   Prothrombin Time 32.6 (H) 11.4 - 15.2 seconds   INR 3.2 (H) 0.8 - 1.2    Comment: (NOTE) INR goal varies based on device and disease states. Performed at [facility], [place] [address] [place], [place] [address]     Dg Knee Complete 4 Views Left  Result Date: 05/08/2019 CLINICAL DATA:  Left knee pain after a log rolled over his legs today. EXAM: LEFT KNEE - COMPLETE 4+ VIEW COMPARISON:  None. FINDINGS: Comminuted distal femur fracture at the metaphysis with posterior displacement and angulation of the distal fragment as well as some overlapping of the fragments. Marked lateral joint space narrowing. No visible effusion. Atheromatous arterial calcifications. The bones  are osteopenic. IMPRESSION: 1. Comminuted distal femur fracture with posterior displacement and angulation. 2. Marked lateral knee joint space narrowing. Electronically Signed   By: Claudie Revering M.D.   On: 05/08/2019 14:31   Dg Knee Complete 4 Views Right  Result Date: 05/08/2019 CLINICAL DATA:  Right knee pain after a log rolled across his legs today. EXAM: RIGHT KNEE - COMPLETE 4+ VIEW COMPARISON:  None. FINDINGS: Comminuted fracture of the proximal tibia involving the metaphysis and epiphysis, extending into the joint space laterally. This is involving the medial and lateral tibial plateaus. No significant displacement or angulation. Small to moderate-sized effusion. Moderate patellofemoral and lateral compartment spur formation. Moderate to marked medial joint space narrowing and associated mild spur formation. Atheromatous arterial calcifications. IMPRESSION: 1. Comminuted proximal tibia fracture with intra-articular extension. 2. Small to moderate-sized effusion. 3. Tricompartmental degenerative changes. Electronically Signed   By: Claudie Revering M.D.   On: 05/08/2019 14:33    Review of Systems  Constitutional: Negative for weight loss.  HENT: Negative for ear discharge, ear pain, hearing loss and tinnitus.   Eyes: Negative for blurred vision, double vision, photophobia and pain.  Respiratory: Negative for cough, sputum production and shortness of breath.   Cardiovascular: Negative for chest pain.  Gastrointestinal: Negative for abdominal pain, nausea and vomiting.  Genitourinary: Negative for dysuria, flank pain, frequency and urgency.  Musculoskeletal: Positive for joint pain (Bilateral knees). Negative for back pain, falls, myalgias and neck pain.  Neurological: Negative for dizziness, tingling, sensory change, focal weakness, loss of consciousness and headaches.  Endo/Heme/Allergies: Does not bruise/bleed easily.  Psychiatric/Behavioral: Negative for depression, memory loss and substance  abuse. The patient is not nervous/anxious.    Blood pressure (!) 147/76, pulse 79, temperature (!) 97.4 F (36.3 C), temperature source Oral, resp. rate 17, height 6' (1.829 m), weight 89.4 kg, SpO2 96 %. Physical Exam  Constitutional: He appears well-developed and well-nourished. No distress.  HENT:  Head: Normocephalic and atraumatic.  Eyes: Conjunctivae are normal. Right eye exhibits no discharge. Left eye exhibits no discharge. No scleral icterus.  Neck: Normal range of motion.  Cardiovascular: Normal rate and regular rhythm.  Respiratory: Effort normal. No respiratory distress.  Musculoskeletal:  Comments: RLE No traumatic wounds, ecchymosis, or rash  Severe TTP knee, proximal compartments tight, pain with passive dorsiflexion  No ankle effusion  Sens DPN, SPN, TN intact but c/o mild numbness  Motor EHL, ext, flex, evers 5/5  DP 1+, PT 0, No significant edema  LLE No traumatic wounds, ecchymosis, or rash  Severe TTP knee  Mod knee effusion, no ankle effusion  Sens DPN, SPN, TN intact  Motor EHL, ext, flex, evers 5/5  DP 1+, PT 0, No significant edema  Neurological: He is alert.  Skin: Skin is warm and dry. He is not diaphoretic.  Psychiatric: He has a normal mood and affect. His behavior is normal.    Assessment/Plan: Right tibia plateau fx -- I believe he had impending compartment syndrome. Will reverse coumadin and will take to OR for fasciotomy and ex fix. Left distal femur fx -- Will need ORIF but tonight will get ex fix with plan to return to OR later. Multiple medical problems including bladder cancer, diastolic heart failure, obstructive sleep apnea, chronic atrial fibrillation on Coumadin, coronary artery disease, COPD, DVT and PE, status post AV nodal ablation and biventricular pacemaker -- Have asked medicine to admit and manage. Appreciate their help.    Lisette Abu, PA-C Orthopedic Surgery [phone] 05/08/2019, 2:51 PM

## 2019-05-08 NOTE — Anesthesia Procedure Notes (Signed)
Arterial Line Insertion Start/End11/12/2018 6:48 PM, 05/08/2019 6:51 PM Performed by: Wilburn Cornelia, CRNA, CRNA  Patient location: OR. Preanesthetic checklist: patient identified, IV checked, site marked, risks and benefits discussed, surgical consent, monitors and equipment checked, pre-op evaluation, timeout performed and anesthesia consent Patient sedated Right, radial was placed Catheter size: 20 G Hand hygiene performed  and maximum sterile barriers used  Allen's test indicative of satisfactory collateral circulation Attempts: 1 Procedure performed without using ultrasound guided technique. Following insertion, Biopatch and dressing applied. Post procedure assessment: normal  Patient tolerated the procedure well with no immediate complications.

## 2019-05-08 NOTE — Op Note (Signed)
Orthopaedic Surgery Operative Note (CSN: [id number] ) Date of Surgery: 05/08/2019  Admit Date: 05/08/2019   Diagnoses: Pre-Op Diagnoses: Right lower extremity impending compartment syndrome Right bicondylar tibial plateau fracture Left intra-articular distal femur fracture Bilateral tricompartmental knee arthritis   Post-Op Diagnosis: Same  Procedures: 1. CPT 20690-External fixation of right tibial plateau fracture 2. CPT 27532-Closed reduction of right tibial plateau fracture 3. CPT 27602-Right lower extremity 4-compartment fasciotomy release 4. CPT 54627-OJJK reduction internal fixation of left intra-articular distal femur fracture 5. CPT 97605-Wound vac placement to right lower extremity  Surgeons : Primary: Latham Kinzler, Thomasene Lot, MD  Assistant: Patrecia Pace, PA-C  Location: OR 5   Anesthesia:General  Antibiotics: Ancef 2g preop   Tourniquet time:None    Estimated Blood KXFG:182 mL  Complications:None   Specimens:None   Implants: Implant Name Type Inv. Item Serial No. Manufacturer Lot No. LRB No. Used Action  CLAMP PIN LARGE 6POSITION - [id number] Clamp CLAMP PIN LARGE 6POSITION  SYN[employer]ES [employer]  Right 2 Implanted  CLAMP ROD ATTACHMENT - [id number] Clamp CLAMP ROD ATTACHMENT  [employer]  Right 2 Implanted  ROD CARBON FIBER 500MM - [id number] Rod ROD CARBON FIBER 500MM  SY[employer]UMA  Right 2 Implanted  SCREW CORTICAL 5.0X95MM - [id number] Screw SCREW CORTICAL 5.0X95MM  ZIMMER [employer]ON(ORTH,TRAU,BIO,SG) [id number] Left 1 Implanted  SCREW CORTICAL NCB 5.0X90MM - [id number] Screw SCREW CORTICAL NCB 5.0X90MM  [employer](ORTH,TRAU,BIO,SG) [id number] Left 1 Implanted  CORTICAL SCREW 5.0 100MM    [employer](ORTH,TRAU,BIO,SG) [id number] Left 1 Implanted  PLATE DIST FEM 33A - [id number] Plate PLATE DIST FEM 34L  [employer](ORTH,TRAU,BIO,SG)  Left 1 Implanted  CAP LOCK NCB - [id number] Cap CAP LOCK NCB  [employer](ORTH,TRAU,BIO,SG)  Left 8 Implanted  SCREW 5.0 60MM - [id number] Screw SCREW  5.0 60MM  Z[employer](ORTH,TRAU,BIO,SG)  Left 2 Implanted  SCREW NCB 4.2AS34H - [id number] Screw SCREW NCB 4.9QJ19E  Z[employer]MER [employer]ON(ORTH,TRAU,BIO,SG)  Left 1 Implanted     Indications for Surgery: 85 year old male who injured his bilateral lower extremities with a large log rolled on top of him.  He had a history of PE as well as atrial fibrillation on Coumadin.  He had significant swelling to the right lower extremity with a proximal tibia fracture with intra-articular extension.  He does have some concerning findings on clinical exam for impending compartment syndrome.  Due to the anticoagulation as well as the clinical exam I recommended proceeding with compartment release with external fixation of the right lower extremity.  At the same time I will plan to proceed with either external fixation or open reduction internal fixation of left distal femur fracture.  Risks and benefits were discussed with the patient and his wife.  Risks include but not limited to bleeding, infection, malunion, nonunion, hardware failure, hardware irritation, posttraumatic arthritis, knee stiffness, muscle damage and death, nerve and blood vessel injury, even the possibility loss of limb and anesthetic complications.  The patient and the wife agreed to proceed with surgery and consent was obtained.  Operative Findings: 1.  Right bicondylar tibial plateau fracture with impending compartment syndrome treated with spanning knee external fixator using Synthes large external fixator and closed reduction of right tibial plateau fracture 2.  4 compartment fasciotomy release with viable appearing muscle without any significant damage or hematoma. 3.  Wound VAC placement to the right lower extremity fasciotomy wounds. 4.  Open reduction internal fixation of left intra-articular distal femur fracture using [employer]immer [employer] NCB distal femoral locking plate.  End-stage tricompartment knee arthritis.  Procedure: The  patient was identified  in the preoperative holding area. Consent was confirmed with the patient and their family and all questions were answered. The operative extremity was marked after confirmation with the patient. he was then brought back to the operating room by our anesthesia colleagues.  He was placed under general anesthetic and carefully transferred over to a radiolucent flat top table.  His bilateral lower extremities were then prepped and draped in usual sterile fashion.  A timeout was performed to verify the patient procedure and the extremities.  The patient had received 10 mg of IV vitamin K in the emergency room.  He also had received 1 unit of FFP to reverse his INR which was 3.2.  I first started out by performing the fasciotomies.  I made a lateral incision along the length of the fibula.  Carried it down through skin subcutaneous tissue.  I identified the raphae between the anterior and lateral compartments.  I incised the fascia of the lateral compartment and anterior compartment and extended this proximally and distally to adequately release all the muscle.  The muscle was viable without significant edema.  There is no significant hematoma that was present.  I identified the superficial peroneal nerve and protected this throughout the case.  Once the anterior and lateral compartments were released I turned my attention to the medial compartments.  Along the posterior border of the tibia I made an incision extended this down through skin and subcutaneous tissue.  I protected the saphenous neurovascular bundle.  I incised through the superficial compartment and released the fascia of the soleus and gastroc.  I then released the deep posterior compartment off the posterior border of the tibia.  Again the muscle was all viable without any significant necrosis and no signs of significant hematoma.  Once the compartments were released I then turned my attention to external fixation.  Percutaneous incisions were made  proximal to the knee.  I drilled and placed a 5.0 mm threaded half pins in the femur gaining bicortical fixation.  The process was repeated in the tibial shaft making sure that I had enough space for a plateau plate without crossing my exfix pins.  The pins were connected to clamps which were connected to 106mm bars.  Reduction was performed of the tibial plateau and the construct was tightened.  Once the external fixator was in place I turned my attention to the left distal femur.  Fortunately he did not have much bleeding during the fasciotomy release and I felt that we could proceed with formal open reduction internal fixation.  The patient had an intra-articular split that I could identify by the radiographs.  It did not appear that he had a Hoffa fragment.  I started out by obtaining fluoroscopic imaging to confirm the unstable nature of his injury.  I then performed a lateral parapatellar incision.  I carried this down through skin and subcutaneous tissue.  I released the retinaculum lateral to the patella and extended this proximally to include the fascia of the IT band.  I then reflected some of the vastus lateralis to be able to mobilize the muscle to access the fracture.  I then visualized the fracture site I was able to mobilize each of the condyles to get correct rotation.  I then used a reduction tenaculum to compress the condyles together to get a near anatomic reduction of the intra-articular split.  This was held provisionally with 2.0 mm K wires that were position outside of the footprint  of the plate.  I then chose a 12 hole [employer]immer [employer] NCB distal femoral locking plate.  I slid this submuscularly along the lateral border of the femur.  I held it provisionally with a K wire in the distal segment.  I confirmed adequate positioning of the distal plate.  I then placed a percutaneous incision and a 3.3 mm drill bit in the proximal shaft to hold the position of the proximal portion of the  plate.  I then used a 3.3 mm drill bit to place 5.0 millimeter screws in the distal articular block.  A total of 3 screws were placed.  I then placed a 5.0 millimeter screws into the tibial shaft through percutaneous incisions.  I replaced the 3.3 mm drill bit in the proximal portion of the plate with a 4.0 millimeter screw.  I placed locking caps on the distal 2 shaft screws.  I kept the last screw without a locking cap to prevent a stress riser.  I returned to the distal articular block and placed 2 more screws.  The jig was removed I placed locking caps on all 5 of the distal screws.  The provisional K wires for the intra-articular split was removed.  Final fluoroscopic imaging was obtained.  The incision was copiously irrigated.  A gram of vancomycin powder was placed into the incision.  The IT band was closed with 0 Vicryl suture.  The skin was closed with 2-0 Vicryl and 3-0 nylon.  The left lower extremity was then dressed with Mepilex dressings and a compressive Ace wrap was applied.  The right lower extremity was dressed with a wound VAC to the medial lateral fasciotomy wounds.  Cast padding and Ace wrap was placed to provide compression.  The patient was then awoken from anesthesia and taken the PACU in stable condition.  Post Op Plan/Instructions: Patient will be nonweightbearing bilateral lower extremities.  He will receive postoperative Ancef.  We will plan to return to the operating room on Sunday for possible fasciotomy closure and likely ORIF of his tibial plateau.  I would prefer anticoagulation to be held until postoperatively from his ORIF of his tibial plateau.  However if anticoagulation is needed it may be restarted tomorrow morning with heparin drip and stopped prior to his surgery Sunday morning.  I will defer to the primary team for this.  I was present and performed the entire surgery.  Patrecia Pace, PA-C did assist me throughout the case. An assistant was necessary given the  difficulty in approach, maintenance of reduction and ability to instrument the fracture.  Katha Hamming, MD [facility]

## 2019-05-08 NOTE — Anesthesia Preprocedure Evaluation (Signed)
Anesthesia Evaluation  Patient identified by MRN, date of birth, ID band Patient awake    Reviewed: Allergy & Precautions, NPO status , Patient's Chart, lab work & pertinent test results  History of Anesthesia Complications Negative for: history of anesthetic complications  Airway Mallampati: I  TM Distance: >3 FB Neck ROM: Full    Dental  (+) Edentulous Upper, Partial Lower   Pulmonary sleep apnea , COPD, former smoker,  S/P Lung surgery   Pulmonary exam normal        Cardiovascular + CAD and +CHF  + dysrhythmias Atrial Fibrillation + pacemaker  Rhythm:Regular Rate:Normal     Neuro/Psych negative neurological ROS  negative psych ROS   GI/Hepatic Neg liver ROS, GERD  ,  Endo/Other  negative endocrine ROS  Renal/GU negative Renal ROS  negative genitourinary   Musculoskeletal negative musculoskeletal ROS (+)   Abdominal   Peds negative pediatric ROS (+)  Hematology negative hematology ROS (+)   Anesthesia Other Findings   Reproductive/Obstetrics negative OB ROS                             Anesthesia Physical Anesthesia Plan  ASA: IV and emergent  Anesthesia Plan: General   Post-op Pain Management:    Induction: Intravenous  PONV Risk Score and Plan: 2 and Ondansetron, Dexamethasone and Treatment may vary due to age or medical condition  Airway Management Planned: Oral ETT  Additional Equipment:   Intra-op Plan:   Post-operative Plan: Possible Post-op intubation/ventilation  Informed Consent: I have reviewed the patients History and Physical, chart, labs and discussed the procedure including the risks, benefits and alternatives for the proposed anesthesia with the patient or authorized representative who has indicated his/her understanding and acceptance.     Dental advisory given and Consent reviewed with POA  Plan Discussed with: CRNA and  Anesthesiologist  Anesthesia Plan Comments: (Discussed with wife)        Anesthesia Quick Evaluation

## 2019-05-08 NOTE — Progress Notes (Signed)
ANTICOAGULATION CONSULT NOTE - Initial Consult  Pharmacy Consult for Warfarin Indication: atrial fibrillation  Allergies  Allergen Reactions  . Gabapentin Other (See Comments)    Drowsiness   . Nsaids Other (See Comments)    NOT SUPPOSED TO TAKE DUE TO BLOOD THINNERS    Patient Measurements: Height: 6' (182.9 cm) Weight: 197 lb (89.4 kg) IBW/kg (Calculated) : 77.6  Vital Signs: Temp: 97.4 F (36.3 C) (11/06 1225) Temp Source: Oral (11/06 1225) BP: 147/76 (11/06 1225) Pulse Rate: 79 (11/06 1225)  Labs: Recent Labs    05/08/19 1310  HGB 12.7*  HCT 38.2*  PLT 176  LABPROT 32.6*  INR 3.2*  CREATININE 1.24    Estimated Creatinine Clearance: 46.1 mL/min (by C-G formula based on SCr of 1.24 mg/dL).   Medical History: Past Medical History:  Diagnosis Date  . Bladder cancer ([place])   . BPH (benign prostatic hyperplasia)   . GERD (gastroesophageal reflux disease)   . Pancreatitis   . Pneumonia     Assessment: 85 yr old male with history of bladder cancer, diastolic heart failure, obstructive sleep apnea, chronic atrial fibrillation on Coumadin, coronary artery disease, COPD, remote hx of DVT/PE, S/P AV nodal ablation and biventricular pacemaker, and lung cancer (S/P lobectomy) presented to the ED today with concern for significant pain to both knees after being struck with a large log. Pt was diagnosed with bilateral knee fractures and scheduled for ortho surgery this evening.   Warfarin regimen at home PTA was: 3.125 mg PO on Sun/Mon/Tues/Wed/Thurs/Sat and 2.5 mg PO on Friday. INR on admission was 3.2; pt was given vitamin K 5 mg IV X 1 to reverse anticoagulation prior to surgery.  Per Dr. Ladona Horns, team plans to hold off on giving warfarin tonight; will review INR results in AM  H/H 12.7/38.2  Goal of Therapy:  Prevention of stroke associated with atrial fibrillation Monitor platelets by anticoagulation protocol: Yes   Plan:  Confer with primary team/ortho on  Saturday to determine timing of warfarin restart. Monitor INR daily Monitor for signs/symptoms of bleeding  Gillermina Hu, PharmD, BCPS, [facility] Clinical Pharmacist 05/08/2019,6:57 PM

## 2019-05-08 NOTE — Anesthesia Procedure Notes (Signed)
Procedure Name: Intubation Date/Time: 05/08/2019 6:31 PM Performed by: Janace Litten, CRNA Pre-anesthesia Checklist: Patient identified, Emergency Drugs available, Suction available and Patient being monitored Patient Re-evaluated:Patient Re-evaluated prior to induction Oxygen Delivery Method: Circle System Utilized Preoxygenation: Pre-oxygenation with 100% oxygen Induction Type: IV induction, Rapid sequence and Cricoid Pressure applied Laryngoscope Size: Mac and 4 Grade View: Grade I Tube type: Oral Tube size: 7.5 mm Number of attempts: 1 Airway Equipment and Method: Stylet Placement Confirmation: ETT inserted through vocal cords under direct vision,  positive ETCO2 and breath sounds checked- equal and bilateral Secured at: 23 cm Tube secured with: Tape Dental Injury: Teeth and Oropharynx as per pre-operative assessment

## 2019-05-08 NOTE — ED Triage Notes (Signed)
Pt arrives via EMS from home with reports of getting right foot stuck and tripping when getting out of skid steer. Denies LOC. Endorses pain to bilateral knees. Swelling noted. 100 mcg fentanyl and 4 mg zofran given by EMS.

## 2019-05-08 NOTE — H&P (Signed)
Date: 05/08/2019               Patient Name:  Jerome Newman MRN: [id number]  DOB: Sep 10, 1929 Age / Sex: 85 y.o., male   PCP: [name]., MD         Medical Service: Internal Medicine Teaching Service         Attending Physician: Dr. Heber [place]    First Contact: Dr. Ronnald Ramp Pager: [phone]  Second Contact: Dr. Eileen Stanford Pager: [phone]       After Hours (After 5p/  First Contact Pager: [phone]  weekends / holidays): Second Contact Pager: [phone]   Chief Complaint: Right leg pain  History of Present Illness: Jerome Newman is a very pleasant 85 year old gentleman with medical history significant for chronic diastolic heart failure, obstructive sleep apnea, chronic atrial fibrillation on Coumadin status post AV nodal ablation and biventricular pacemaker, COPD, remote history of DVT/PE, BPH, and lung cancer status post lobectomy who presented to [facility] ED on May 08, 2019 after he was struck on the right foot by a large log.  He was initially in his usual state of health until this morning when a large log rolled off and struck him in his legs while he was working in the yard and subsequently fell to the floor without LOC or head trauma.   In the ED, pt was hemodynamically stable with HR 79, BP 147/76, RR 17 with O2 saturation 96% on RA. X-rays significant for R proximal tibia fracture and L distal femur fracture. Ortho evaluated the pt and were concerned for impending compartment syndrome in the R leg. Recommending urgent fasciotomy and external fixation in the OR. Medicine called for admission.  Meds:  No current facility-administered medications on file prior to encounter.    Current Outpatient Medications on File Prior to Encounter  Medication Sig Dispense Refill  . acetaminophen (TYLENOL) 500 MG tablet Take 1,000 mg by mouth 2 (two) times daily.     . meloxicam (MOBIC) 15 MG tablet Take 15 mg by mouth daily.    . OXYGEN Inhale 2 L/min into the lungs at bedtime.    . pantoprazole  (PROTONIX) 40 MG tablet Take 40 mg by mouth 2 (two) times daily.    Vladimir Faster Glyc-Propyl Glyc PF (SYSTANE PRESERVATIVE FREE) 0.4-0.3 % SOLN Place 1-2 drops into both eyes 3 (three) times daily as needed (for dryness).    . potassium chloride SA (K-DUR) 20 MEQ tablet TAKE 1 TABLET BY MOUTH DAILY (Patient taking differently: Take 20 mEq by mouth daily. ) 90 tablet 1  . solifenacin (VESICARE) 5 MG tablet Take 5 mg by mouth every other day.    . torsemide (DEMADEX) 10 MG tablet TAKE 1 TABLET BY MOUTH ONCE DAILY (Patient taking differently: Take 10 mg by mouth daily. ) 90 tablet 1  . warfarin (COUMADIN) 2.5 MG tablet TAKE 1/2 TO 1 TABLET BY MOUTH DAILY AS DIRECTED BY COUMADIN CLINIC (Patient taking differently: Take 2.5-3.125 mg by mouth See admin instructions. Take 3.125 mg by mouth at bedtime on Sun/Mon/Tues/Wed/Thurs/Sat and 2.5 mg on Fri) 75 tablet 0  . Meloxicam 5 MG CAPS Take 15 mg by mouth daily. (Patient not taking: Reported on 05/08/2019) 90 capsule 3   Allergies: Allergies as of 05/08/2019 - Review Complete 05/08/2019  Allergen Reaction Noted  . Gabapentin Other (See Comments) 09/28/2015  . Nsaids Other (See Comments) 11/16/2014   Past Medical History:  Diagnosis Date  . Bladder cancer ([place])   . BPH (  benign prostatic hyperplasia)   . GERD (gastroesophageal reflux disease)   . Pancreatitis   . Pneumonia     Family History: Heart disease in father. Cancer in sister.  Social History: Pt is a former smoker, quitting 9 years ago. He lives at home with his wife. They recently got a puppy, Ollie. He is able to drive and perform his own ADLs at home. Occasionally uses a cane or scooter at home if he will be walking for awhile.   Review of Systems: Review of Systems  Constitutional: Negative for chills and fever.  HENT: Negative for congestion and sore throat.   Eyes: Negative for blurred vision and double vision.  Respiratory: Negative for cough and shortness of breath.    Cardiovascular: Negative for chest pain and palpitations.  Gastrointestinal: Positive for nausea. Negative for abdominal pain and vomiting.  Genitourinary: Negative.   Musculoskeletal: Positive for joint pain. Negative for back pain and falls.  Skin:       Bruising over R knee and leg.  Neurological: Negative for dizziness and headaches.   Physical Exam: Blood pressure (!) 147/76, pulse 79, temperature (!) 97.4 F (36.3 C), temperature source Oral, resp. rate 17, height 6' (1.829 m), weight 89.4 kg, SpO2 96 %. Physical Exam Vitals signs and nursing note reviewed.  Constitutional:      General: He is not in acute distress.    Appearance: He is not ill-appearing.     Comments: Pt seen laying in bed, visibly uncomfortable and in pain.  HENT:     Head: Normocephalic and atraumatic. No contusion.  Neck:     Musculoskeletal: Normal range of motion and neck supple.  Cardiovascular:     Rate and Rhythm: Normal rate and regular rhythm.     Pulses:          Dorsalis pedis pulses are 2+ on the right side and 2+ on the left side.     Heart sounds: Normal heart sounds.  Pulmonary:     Effort: Pulmonary effort is normal. No tachypnea or respiratory distress.     Breath sounds: Normal breath sounds. No wheezing, rhonchi or rales.  Abdominal:     General: Abdomen is flat. Bowel sounds are normal.     Palpations: Abdomen is soft.     Tenderness: There is no abdominal tenderness.  Musculoskeletal:        General: Swelling (R knee and lower leg) and deformity (L proximal knee) present.     Right lower leg: No edema.     Left lower leg: No edema.  Skin:    General: Skin is warm and dry.     Comments: Bruising and abrasions over R knee and lower leg.  Neurological:     General: No focal deficit present.     Mental Status: He is alert.     Sensory: No sensory deficit.  Psychiatric:        Mood and Affect: Mood normal.    X ray LEFT Knee: IMPRESSION: 1. Comminuted distal femur fracture  with posterior displacement and angulation. 2. Marked lateral knee joint space narrowing.   X ray RIGHT Knee: IMPRESSION: 1. Comminuted proximal tibia fracture with intra-articular extension. 2. Small to moderate-sized effusion. 3. Tricompartmental degenerative changes.   CT RIGHT Knee: IMPRESSION: 1. Acute split fracture of the lateral tibial plateau with nondisplaced transverse fracture of the tibial metaphysis. 2. Acute nondisplaced fracture of the anterior fibular head. 3. Moderate lipohemarthrosis. 4. Tricompartmental osteoarthritis, moderate to severe in the  medial compartment.   Assessment & Plan by Problem: Active Problems:   Tibia/fibula fracture    Mr. Hayashi is an 85 year old gentleman with significant PMH of chronic diastolic heart failure, obstructive sleep apnea, chronic atrial fibrillation on Coumadin s/p AV nodal ablation and biventricular pacemaker, COPD, remote history of DVT/PE, BPH, and lung cancer s/p lobectomy, who was admitted with a R proximal tibia fracture and L distal femur fracture after a traumatic injury when a log rolled over his legs.    Tibia-fibula fracture Pt with R proximal tibia fracture and L distal femur fracture. Concern for developing compartment syndrome. Ortho planning for urgent compartment release with external fixation of the R lower extremity with simultaneous external fixation vs open reduction internal fixation of the L distal femur this evening. - follow along with orthopedic recommendations    - will manage DVT ppx after surgery  Chronic diastolic heart failure Appears well compensated on exam, without signs of edema or respiratory distress. - holding home torsemide   Chronic a fib Pt has been on Coumadin for years after remote history of DVT/PE. Status-post AV nodal ablation and biventricular pacemaker. INR 3.2 on admission. Hgb 12.7. - received Vit K due to supra-therapeutic INR - will hold warfarin    Diet: NPO for  surgery Fluids: None VTE ppx: SCDs CODE STATUS: FULL CODE  Dispo: Admit patient to Inpatient with expected length of stay greater than 2 midnights.  Signed: Ladona Horns, MD 05/08/2019, 5:58 PM  Pager: [phone] Internal Medicine Teaching Service

## 2019-05-09 DIAGNOSIS — Z902 Acquired absence of lung [part of]: Secondary | ICD-10-CM

## 2019-05-09 DIAGNOSIS — S82101A Unspecified fracture of upper end of right tibia, initial encounter for closed fracture: Secondary | ICD-10-CM

## 2019-05-09 DIAGNOSIS — Z86718 Personal history of other venous thrombosis and embolism: Secondary | ICD-10-CM

## 2019-05-09 DIAGNOSIS — J449 Chronic obstructive pulmonary disease, unspecified: Secondary | ICD-10-CM

## 2019-05-09 DIAGNOSIS — I482 Chronic atrial fibrillation, unspecified: Secondary | ICD-10-CM

## 2019-05-09 DIAGNOSIS — Z86711 Personal history of pulmonary embolism: Secondary | ICD-10-CM

## 2019-05-09 DIAGNOSIS — Z7901 Long term (current) use of anticoagulants: Secondary | ICD-10-CM

## 2019-05-09 DIAGNOSIS — D62 Acute posthemorrhagic anemia: Secondary | ICD-10-CM

## 2019-05-09 DIAGNOSIS — I5032 Chronic diastolic (congestive) heart failure: Secondary | ICD-10-CM

## 2019-05-09 DIAGNOSIS — N400 Enlarged prostate without lower urinary tract symptoms: Secondary | ICD-10-CM

## 2019-05-09 DIAGNOSIS — W208XXA Other cause of strike by thrown, projected or falling object, initial encounter: Secondary | ICD-10-CM

## 2019-05-09 DIAGNOSIS — Z79899 Other long term (current) drug therapy: Secondary | ICD-10-CM

## 2019-05-09 DIAGNOSIS — S72402A Unspecified fracture of lower end of left femur, initial encounter for closed fracture: Secondary | ICD-10-CM

## 2019-05-09 DIAGNOSIS — Z85118 Personal history of other malignant neoplasm of bronchus and lung: Secondary | ICD-10-CM

## 2019-05-09 DIAGNOSIS — Z950 Presence of cardiac pacemaker: Secondary | ICD-10-CM

## 2019-05-09 DIAGNOSIS — G4733 Obstructive sleep apnea (adult) (pediatric): Secondary | ICD-10-CM

## 2019-05-09 LAB — BPAM FFP
Blood Product Expiration Date: 202011112359
Blood Product Expiration Date: 202011112359
ISSUE DATE / TIME: 202011061755
ISSUE DATE / TIME: 202011061754
Unit Type and Rh: 6200
Unit Type and Rh: 6200

## 2019-05-09 LAB — PROTIME-INR
INR: 1.5 — ABNORMAL HIGH (ref 0.8–1.2)
Prothrombin Time: 18.3 seconds — ABNORMAL HIGH (ref 11.4–15.2)

## 2019-05-09 LAB — POCT I-STAT 7, (LYTES, BLD GAS, ICA,H+H)
Acid-base deficit: 1 mmol/L (ref 0.0–2.0)
Bicarbonate: 25.3 mmol/L (ref 20.0–28.0)
Calcium, Ion: 1.23 mmol/L (ref 1.15–1.40)
HCT: 30 % — ABNORMAL LOW (ref 39.0–52.0)
Hemoglobin: 10.2 g/dL — ABNORMAL LOW (ref 13.0–17.0)
O2 Saturation: 100 %
Potassium: 4.6 mmol/L (ref 3.5–5.1)
Sodium: 139 mmol/L (ref 135–145)
TCO2: 27 mmol/L (ref 22–32)
pCO2 arterial: 49.5 mmHg — ABNORMAL HIGH (ref 32.0–48.0)
pH, Arterial: 7.317 — ABNORMAL LOW (ref 7.350–7.450)
pO2, Arterial: 484 mmHg — ABNORMAL HIGH (ref 83.0–108.0)

## 2019-05-09 LAB — VITAMIN D 25 HYDROXY (VIT D DEFICIENCY, FRACTURES): Vit D, 25-Hydroxy: 32.71 ng/mL (ref 30–100)

## 2019-05-09 LAB — PREPARE FRESH FROZEN PLASMA: Unit division: 0

## 2019-05-09 LAB — BASIC METABOLIC PANEL
Anion gap: 9 (ref 5–15)
BUN: 22 mg/dL (ref 8–23)
CO2: 22 mmol/L (ref 22–32)
Calcium: 8.9 mg/dL (ref 8.9–10.3)
Chloride: 108 mmol/L (ref 98–111)
Creatinine, Ser: 1.16 mg/dL (ref 0.61–1.24)
GFR calc Af Amer: >60 mL/min (ref >60)
GFR calc non Af Amer: 56 mL/min — ABNORMAL LOW (ref >60)
Glucose, Bld: 147 mg/dL — ABNORMAL HIGH (ref 70–99)
Potassium: 4.7 mmol/L (ref 3.5–5.1)
Sodium: 139 mmol/L (ref 135–145)

## 2019-05-09 LAB — CBC
HCT: 26.5 % — ABNORMAL LOW (ref 39.0–52.0)
Hemoglobin: 8.9 g/dL — ABNORMAL LOW (ref 13.0–17.0)
MCH: 30.4 pg (ref 26.0–34.0)
MCHC: 33.6 g/dL (ref 30.0–36.0)
MCV: 90.4 fL (ref 80.0–100.0)
Platelets: 136 10*3/uL — ABNORMAL LOW (ref 150–400)
RBC: 2.93 MIL/uL — ABNORMAL LOW (ref 4.22–5.81)
RDW: 14.3 % (ref 11.5–15.5)
WBC: 10.2 10*3/uL (ref 4.0–10.5)
nRBC: 0 % (ref 0.0–0.2)

## 2019-05-09 MED ORDER — ONDANSETRON HCL 4 MG/2ML IJ SOLN
4.0000 mg | Freq: Four times a day (QID) | INTRAMUSCULAR | Status: DC | PRN
  Administered 2019-05-09 – 2019-05-14 (×5): 4 mg via INTRAVENOUS
  Filled 2019-05-09 (×5): qty 2

## 2019-05-09 MED ORDER — CHLORHEXIDINE GLUCONATE CLOTH 2 % EX PADS
6.0000 | MEDICATED_PAD | Freq: Every day | CUTANEOUS | Status: DC
  Administered 2019-05-09: 6 via TOPICAL

## 2019-05-09 NOTE — Progress Notes (Signed)
PT Cancellation Note  Patient Details Name: Jerome Newman MRN: [id number] DOB: 11-07-1929   Cancelled Treatment:    Reason Eval/Treat Not Completed: Patient not medically ready. Pt in a lot of pain and is now lethargic/sleepy from pain medication. Per notes pt to go to OR tomorrow for closing of fasciotomy and R ORIF of tibial plateau fracture. PT to hold evaluation completion until s/p surgeries tomorrow. PT to return as able, as appropriate to complete PT Eval.  Kittie Plater, PT, DPT Acute Rehabilitation Services Pager #: [phone] Office #: [phone]    Berline Lopes 05/09/2019, 8:21 AM

## 2019-05-09 NOTE — Anesthesia Preprocedure Evaluation (Addendum)
Anesthesia Evaluation  Patient identified by MRN, date of birth, ID band Patient awake    Reviewed: Allergy & Precautions, NPO status , Patient's Chart, lab work & pertinent test results  History of Anesthesia Complications Negative for: history of anesthetic complications  Airway Mallampati: I  TM Distance: >3 FB Neck ROM: Full    Dental  (+) Edentulous Upper, Partial Lower   Pulmonary sleep apnea , COPD, former smoker,  S/P Lung surgery   Pulmonary exam normal        Cardiovascular + CAD and +CHF  Normal cardiovascular exam+ dysrhythmias Atrial Fibrillation + pacemaker      Neuro/Psych negative neurological ROS  negative psych ROS   GI/Hepatic Neg liver ROS, GERD  ,  Endo/Other  negative endocrine ROS  Renal/GU negative Renal ROS  negative genitourinary   Musculoskeletal negative musculoskeletal ROS (+)   Abdominal   Peds negative pediatric ROS (+)  Hematology negative hematology ROS (+)   Anesthesia Other Findings   Reproductive/Obstetrics negative OB ROS                            Anesthesia Physical  Anesthesia Plan  ASA: III  Anesthesia Plan: General   Post-op Pain Management:    Induction: Intravenous  PONV Risk Score and Plan: 2 and Ondansetron, Dexamethasone and Treatment may vary due to age or medical condition  Airway Management Planned: Oral ETT  Additional Equipment:   Intra-op Plan:   Post-operative Plan: Extubation in OR  Informed Consent: I have reviewed the patients History and Physical, chart, labs and discussed the procedure including the risks, benefits and alternatives for the proposed anesthesia with the patient or authorized representative who has indicated his/her understanding and acceptance.     Dental advisory given  Plan Discussed with: Anesthesiologist and CRNA  Anesthesia Plan Comments:        Anesthesia Quick Evaluation

## 2019-05-09 NOTE — Progress Notes (Signed)
OT Cancellation Note  Patient Details Name: Jerome Newman MRN: [id number] DOB: 1929/09/04   Cancelled Treatment:    Reason Eval/Treat Not Completed: Medical issues which prohibited therapy;Fatigue/lethargy limiting ability to participate. Pt now lethargic/sleepy from pain medication. Per notes pt to go to OR tomorrow for closing of fasciotomy and R ORIF of tibial plateau fracture. OT to hold evaluation completion until s/p surgeries tomorrow. OT will return as able, as appropriate  Britt Bottom 05/09/2019, 10:15 AM

## 2019-05-09 NOTE — Progress Notes (Signed)
   Subjective: Pt seen at the bedside this morning. States he had a difficult night due to trouble getting comfortable with the LE dressings and ex-fix. Pt is aware that he will have an additional surgery tomorrow. Is currently endorsing bilateral leg pain. Wife to be at beside later this morning.  Objective:  Vital signs in last 24 hours: Vitals:   05/08/19 2145 05/08/19 2200 05/08/19 2231 05/09/19 0622  BP: 126/82 (!) 143/67 128/67 (!) 107/55  Pulse: 84 83 84 80  Resp: (!) 21 (!) 33 (!) 24   Temp: 98.4 F (36.9 C)  98.7 F (37.1 C) 98.4 F (36.9 C)  TempSrc:   Oral Oral  SpO2: 100% 93% 97% 91%  Weight:      Height:       Physical Exam Vitals signs and nursing note reviewed.  Constitutional:      General: He is not in acute distress.    Appearance: He is not ill-appearing.     Comments: Alert and oriented. Pt is very hard of hearing.  Cardiovascular:     Rate and Rhythm: Normal rate.     Heart sounds: Normal heart sounds.  Pulmonary:     Effort: Pulmonary effort is normal.  Abdominal:     General: Bowel sounds are normal.     Palpations: Abdomen is soft.  Musculoskeletal:     Comments: LLE dressing in place with RLE in ex-fix  Skin:    General: Skin is warm and dry.  Neurological:     Mental Status: He is alert.    Assessment/Plan:  Active Problems:   Tibia/fibula fracture  Mr. Boen is an 85 year old gentleman with significant PMH of chronic diastolic heart failure, obstructive sleep apnea, chronic atrial fibrillation on Coumadin s/p AV nodal ablation and biventricular pacemaker, COPD, remote history of DVT/PE, BPH, and lung cancer s/p lobectomy, who was admitted with a R proximal tibia fracture and L distal femur fracture after a traumatic injury when a log rolled over his legs.    Tibia-fibula fracture - R proximal tibia fracture and L distal femur fracture  Post-op day 1 from external fixation with closed reduction of right tibial plateau fracture,  4-compartment fasciotomy release of RLE, and open reduction internal fixation of left intra-articular distal femur fracture. Orthopedic recommendations - non weight bearing in bilateral LE - return to OR tomorrow for possible fasciotomy closure and likely open reduction internal fixation of tibial plateua - holding anticoagulation until after surgery tomorrow  Pt with acute blood loss anemia. Hgb today 8.9, down from 12.7 on admission. Not symptomatic and hemodynamically stable. Will continue to monitor.  Chronic diastolic heart failure Appears well compensated on exam, without signs of edema or respiratory distress. - holding home torsemide   Chronic a fib On Coumadin after remote history of DVT/PE. Status-post AV nodal ablation and biventricular pacemaker. INR 3.2 on admission and received Vit K preop.  - repeat INR 1.5 today - holding warfarin   Diet: Heart healthy Fluids: None VTE ppx: SCDs CODE STATUS: FULL CODE  Dispo: Anticipated discharge pending further surgical intervention and functional status post-operatively.    Ladona Horns, MD 05/09/2019, 6:39 AM Pager: [phone]

## 2019-05-09 NOTE — Progress Notes (Signed)
ANTICOAGULATION CONSULT NOTE - Follow Up Consult  Pharmacy Consult for Coumadin Indication: afib, h/o remote DVT/PE  Allergies  Allergen Reactions  . Gabapentin Other (See Comments)    Drowsiness   . Nsaids Other (See Comments)    NOT SUPPOSED TO TAKE DUE TO BLOOD THINNERS    Patient Measurements: Height: 6' (182.9 cm) Weight: 197 lb (89.4 kg) IBW/kg (Calculated) : 77.6  Vital Signs: Temp: 97.6 F (36.4 C) (11/07 0908) Temp Source: Oral (11/07 0908) BP: 127/74 (11/07 0908) Pulse Rate: 80 (11/07 0908)  Labs: Recent Labs    05/08/19 1310 05/09/19 0320  HGB 12.7* 8.9*  HCT 38.2* 26.5*  PLT 176 136*  LABPROT 32.6* 18.3*  INR 3.2* 1.5*  CREATININE 1.24 1.16    Estimated Creatinine Clearance: 49.2 mL/min (by C-G formula based on SCr of 1.16 mg/dL).  Assessment: Anticoag: h/o a fib, remote hx DVT/PE. S/o OR 11/6. INR 1.5. Post-op Hgb 12.7>8.9. Plts 136.  - Vit K 5mg  IV x 1 on 11/6 pre-op - 11/6: Fasciotomy with B leg external fixaton - 11/8: to OR for closing of fasciotomy and R ORIF of tibial plateau fracture - PTA warf 3.125 mg every day except Friday, when pt takes 2.5 mg with admit INR 3.2  Goal of Therapy:  INR 2-3 Monitor platelets by anticoagulation protocol: Yes   Plan:  OR 11/8 Hold Coumadin until after OR 11/8 IV heparin ok'd by Ortho if desired. Dr. Heber [place] desires no bridging today.   Roran Wegner S. Alford Highland, PharmD, [place] Clinical Staff Pharmacist Eilene Ghazi Stilling[place] 05/09/2019,10:51 AM

## 2019-05-09 NOTE — Progress Notes (Signed)
Orthopaedic Trauma Progress Note  S: A little confused this AM. Understands we went to surgery last night but still unsure of what was fixed. Pain controlled  O:  Vitals:   05/08/19 2231 05/09/19 0622  BP: 128/67 (!) 107/55  Pulse: 84 80  Resp: (!) 24   Temp: 98.7 F (37.1 C) 98.4 F (36.9 C)  SpO2: 97% 91%    NAD, Awake and alert RLE: Wound vac with good seal, serosang output. Ex-fix in place. Moves toes and endorese sensation to foot. LLE: Dressing in place, compartments soft and compressible. Neurovascularly intact  Imaging: Stable postop imaging  Labs:  Results for orders placed or performed during the hospital encounter of 05/08/19 (from the past 24 hour(s))  CBC with Differential     Status: Abnormal   Collection Time: 05/08/19  1:10 PM  Result Value Ref Range   WBC 7.5 4.0 - 10.5 K/uL   RBC 4.15 (L) 4.22 - 5.81 MIL/uL   Hemoglobin 12.7 (L) 13.0 - 17.0 g/dL   HCT 38.2 (L) 39.0 - 52.0 %   MCV 92.0 80.0 - 100.0 fL   MCH 30.6 26.0 - 34.0 pg   MCHC 33.2 30.0 - 36.0 g/dL   RDW 14.3 11.5 - 15.5 %   Platelets 176 150 - 400 K/uL   nRBC 0.0 0.0 - 0.2 %   Neutrophils Relative % 75 %   Neutro Abs 5.7 1.7 - 7.7 K/uL   Lymphocytes Relative 19 %   Lymphs Abs 1.4 0.7 - 4.0 K/uL   Monocytes Relative 4 %   Monocytes Absolute 0.3 0.1 - 1.0 K/uL   Eosinophils Relative 2 %   Eosinophils Absolute 0.1 0.0 - 0.5 K/uL   Basophils Relative 0 %   Basophils Absolute 0.0 0.0 - 0.1 K/uL   Immature Granulocytes 0 %   Abs Immature Granulocytes 0.02 0.00 - 0.07 K/uL  Comprehensive metabolic panel     Status: Abnormal   Collection Time: 05/08/19  1:10 PM  Result Value Ref Range   Sodium 137 135 - 145 mmol/L   Potassium 4.4 3.5 - 5.1 mmol/L   Chloride 103 98 - 111 mmol/L   CO2 21 (L) 22 - 32 mmol/L   Glucose, Bld 118 (H) 70 - 99 mg/dL   BUN 22 8 - 23 mg/dL   Creatinine, Ser 1.24 0.61 - 1.24 mg/dL   Calcium 9.3 8.9 - 10.3 mg/dL   Total Protein 6.0 (L) 6.5 - 8.1 g/dL   Albumin 3.6 3.5  - 5.0 g/dL   AST 21 15 - 41 U/L   ALT 14 0 - 44 U/L   Alkaline Phosphatase 109 38 - 126 U/L   Total Bilirubin 0.7 0.3 - 1.2 mg/dL   GFR calc non Af Amer 52 (L) >60 mL/min   GFR calc Af Amer >60 >60 mL/min   Anion gap 13 5 - 15  Protime-INR     Status: Abnormal   Collection Time: 05/08/19  1:10 PM  Result Value Ref Range   Prothrombin Time 32.6 (H) 11.4 - 15.2 seconds   INR 3.2 (H) 0.8 - 1.2  SARS Coronavirus 2 by RT PCR (hospital order, performed in [facility] hospital lab) Nasopharyngeal Nasopharyngeal Swab     Status: None   Collection Time: 05/08/19  4:15 PM   Specimen: Nasopharyngeal Swab  Result Value Ref Range   SARS Coronavirus 2 NEGATIVE NEGATIVE  Prepare fresh frozen plasma     Status: None (Preliminary result)   Collection Time:  05/08/19  4:20 PM  Result Value Ref Range   Unit Number [id number]    Blood Component Type THAWED PLASMA    Unit division 00    Status of Unit ISSUED    Transfusion Status      OK TO TRANSFUSE Performed at [facility] [address] [place], [place] [address]   Type and screen [place]     Status: None   Collection Time: 05/08/19  4:20 PM  Result Value Ref Range   ABO/RH(D) O POS    Antibody Screen NEG    Sample Expiration      05/11/2019,2359 Performed at [facility], [place] [address] [place], [place] [address]   ABO/Rh     Status: None   Collection Time: 05/08/19  4:20 PM  Result Value Ref Range   ABO/RH(D)      O POS Performed at [facility] [address] [place], [place] [address]   Prepare fresh frozen plasma     Status: None (Preliminary result)   Collection Time: 05/08/19  5:45 PM  Result Value Ref Range   Unit Number [id number]    Blood Component Type THW PLS APHR    Unit division A0    Status of Unit ISSUED    Transfusion Status      OK TO TRANSFUSE Performed at [facility] [address] [place],  [address]   Basic metabolic panel     Status:  Abnormal   Collection Time: 05/09/19  3:20 AM  Result Value Ref Range   Sodium 139 135 - 145 mmol/L   Potassium 4.7 3.5 - 5.1 mmol/L   Chloride 108 98 - 111 mmol/L   CO2 22 22 - 32 mmol/L   Glucose, Bld 147 (H) 70 - 99 mg/dL   BUN 22 8 - 23 mg/dL   Creatinine, Ser 1.16 0.61 - 1.24 mg/dL   Calcium 8.9 8.9 - 10.3 mg/dL   GFR calc non Af Amer 56 (L) >60 mL/min   GFR calc Af Amer >60 >60 mL/min   Anion gap 9 5 - 15  CBC     Status: Abnormal   Collection Time: 05/09/19  3:20 AM  Result Value Ref Range   WBC 10.2 4.0 - 10.5 K/uL   RBC 2.93 (L) 4.22 - 5.81 MIL/uL   Hemoglobin 8.9 (L) 13.0 - 17.0 g/dL   HCT 26.5 (L) 39.0 - 52.0 %   MCV 90.4 80.0 - 100.0 fL   MCH 30.4 26.0 - 34.0 pg   MCHC 33.6 30.0 - 36.0 g/dL   RDW 14.3 11.5 - 15.5 %   Platelets 136 (L) 150 - 400 K/uL   nRBC 0.0 0.0 - 0.2 %  Protime-INR     Status: Abnormal   Collection Time: 05/09/19  3:20 AM  Result Value Ref Range   Prothrombin Time 18.3 (H) 11.4 - 15.2 seconds   INR 1.5 (H) 0.8 - 1.2    Assessment: 85 year old male w/ R bicondylar tibial plateau fracture with impending compartment syndrome and L intra-articular distal femur fracture  Will need ORIF of right tibial plateau fracture and closure of fasciotomy wounds. Will plan for tomorrow AM   Weightbearing: NWB BLE for now, okay for gentle ROM of right knee  Insicional and dressing care: Dressings to remain in place until OR  CV/Blood loss:Acute blood loss anemia from injury and surgery. Hgb okay now. Continue to monitor. INR at  1.5. Would like to hold anticoagulation until postop tomorrow but if need to bridge okay to start heparin drip this AM and stop at 3AM tomorrow  Pain management: 1. Tylenol 1000mg  q6hours 2. Dilaudid 1 mg 1q 3hrs PRN 3. Tramadol 50-100mg  q 6hours PRN  VTE prophylaxis: None currently, will defer to primary team. Will restart coumadin postop from next surgery  ID: Ancef for 24 hours postop  Foley/Lines: Foley in place  Medical  co-morbidities: Multiple medical co-morbidites per primary team  Dispo: TBD, will likely need SNF placement  Follow - up plan: TBD  Shona Needles, MD [facility] [phone] (office) [email or web address]

## 2019-05-09 NOTE — Progress Notes (Signed)
CSW acknowledges consult for SNF. The patient will require PT/OT evaluations. CSW will assist with disposition planning once the evaluations have been completed.    CSW will continue to follow.  

## 2019-05-10 ENCOUNTER — Inpatient Hospital Stay (HOSPITAL_COMMUNITY): Payer: Medicare Other

## 2019-05-10 ENCOUNTER — Encounter (HOSPITAL_COMMUNITY)
Admission: EM | Disposition: A | Discharge status: Skilled Nursing Facility | Payer: Self-pay | Source: Home / Self Care | Attending: Student in an Organized Health Care Education/Training Program

## 2019-05-10 ENCOUNTER — Inpatient Hospital Stay (HOSPITAL_COMMUNITY): Payer: Medicare Other | Admitting: Certified Registered Nurse Anesthetist

## 2019-05-10 ENCOUNTER — Encounter (HOSPITAL_COMMUNITY): Payer: Self-pay | Admitting: *Deleted

## 2019-05-10 DIAGNOSIS — S72462A Displaced supracondylar fracture with intracondylar extension of lower end of left femur, initial encounter for closed fracture: Secondary | ICD-10-CM

## 2019-05-10 DIAGNOSIS — T79A21A Traumatic compartment syndrome of right lower extremity, initial encounter: Secondary | ICD-10-CM

## 2019-05-10 HISTORY — PX: SECONDARY CLOSURE OF WOUND: SHX6208

## 2019-05-10 HISTORY — PX: DRESSING CHANGE UNDER ANESTHESIA: SHX5237

## 2019-05-10 HISTORY — DX: Traumatic compartment syndrome of right lower extremity, initial encounter: T79.A21A

## 2019-05-10 HISTORY — PX: ORIF TIBIA PLATEAU: SHX2132

## 2019-05-10 HISTORY — DX: Displaced supracondylar fracture with intracondylar extension of lower end of left femur, initial encounter for closed fracture: S72.462A

## 2019-05-10 LAB — CBC
HCT: 24.1 % — ABNORMAL LOW (ref 39.0–52.0)
HCT: 23.3 % — ABNORMAL LOW (ref 39.0–52.0)
Hemoglobin: 7.9 g/dL — ABNORMAL LOW (ref 13.0–17.0)
Hemoglobin: 7.7 g/dL — ABNORMAL LOW (ref 13.0–17.0)
MCH: 30.5 pg (ref 26.0–34.0)
MCH: 30.7 pg (ref 26.0–34.0)
MCHC: 32.8 g/dL (ref 30.0–36.0)
MCHC: 33 g/dL (ref 30.0–36.0)
MCV: 93.1 fL (ref 80.0–100.0)
MCV: 92.8 fL (ref 80.0–100.0)
Platelets: 103 10*3/uL — ABNORMAL LOW (ref 150–400)
Platelets: 112 10*3/uL — ABNORMAL LOW (ref 150–400)
RBC: 2.59 MIL/uL — ABNORMAL LOW (ref 4.22–5.81)
RBC: 2.51 MIL/uL — ABNORMAL LOW (ref 4.22–5.81)
RDW: 14.6 % (ref 11.5–15.5)
RDW: 14.6 % (ref 11.5–15.5)
WBC: 8.8 10*3/uL (ref 4.0–10.5)
WBC: 9.4 10*3/uL (ref 4.0–10.5)
nRBC: 0 % (ref 0.0–0.2)
nRBC: 0 % (ref 0.0–0.2)

## 2019-05-10 LAB — PROTIME-INR
INR: 1.3 — ABNORMAL HIGH (ref 0.8–1.2)
Prothrombin Time: 15.9 seconds — ABNORMAL HIGH (ref 11.4–15.2)

## 2019-05-10 LAB — BASIC METABOLIC PANEL
Anion gap: 7 (ref 5–15)
BUN: 28 mg/dL — ABNORMAL HIGH (ref 8–23)
CO2: 24 mmol/L (ref 22–32)
Calcium: 9 mg/dL (ref 8.9–10.3)
Chloride: 107 mmol/L (ref 98–111)
Creatinine, Ser: 1.3 mg/dL — ABNORMAL HIGH (ref 0.61–1.24)
GFR calc Af Amer: 57 mL/min — ABNORMAL LOW (ref >60)
GFR calc non Af Amer: 49 mL/min — ABNORMAL LOW (ref >60)
Glucose, Bld: 104 mg/dL — ABNORMAL HIGH (ref 70–99)
Potassium: 4.7 mmol/L (ref 3.5–5.1)
Sodium: 138 mmol/L (ref 135–145)

## 2019-05-10 SURGERY — SECONDARY CLOSURE OF WOUND
Anesthesia: General | Site: Leg Upper | Laterality: Right

## 2019-05-10 MED ORDER — WARFARIN - PHARMACIST DOSING INPATIENT
Freq: Every day | Status: DC
  Administered 2019-05-10 – 2019-05-12 (×3)

## 2019-05-10 MED ORDER — FENTANYL CITRATE (PF) 100 MCG/2ML IJ SOLN
INTRAMUSCULAR | Status: DC | PRN
  Administered 2019-05-10 (×2): 50 ug via INTRAVENOUS
  Administered 2019-05-10: 100 ug via INTRAVENOUS
  Administered 2019-05-10: 50 ug via INTRAVENOUS

## 2019-05-10 MED ORDER — ONDANSETRON HCL 4 MG/2ML IJ SOLN
INTRAMUSCULAR | Status: AC
  Filled 2019-05-10: qty 2

## 2019-05-10 MED ORDER — FENTANYL CITRATE (PF) 250 MCG/5ML IJ SOLN
INTRAMUSCULAR | Status: AC
  Filled 2019-05-10: qty 5

## 2019-05-10 MED ORDER — SODIUM CHLORIDE 0.9% IV SOLUTION: Freq: Once | INTRAVENOUS | Status: DC

## 2019-05-10 MED ORDER — CEFAZOLIN SODIUM-DEXTROSE 2-4 GM/100ML-% IV SOLN
2.0000 g | Freq: Three times a day (TID) | INTRAVENOUS | Status: AC
  Administered 2019-05-10 – 2019-05-11 (×3): 2 g via INTRAVENOUS
  Filled 2019-05-10 (×3): qty 100

## 2019-05-10 MED ORDER — 0.9 % SODIUM CHLORIDE (POUR BTL) OPTIME
TOPICAL | Status: DC | PRN
  Administered 2019-05-10: 1000 mL

## 2019-05-10 MED ORDER — CEFAZOLIN SODIUM 1 G IJ SOLR
INTRAMUSCULAR | Status: AC
  Filled 2019-05-10: qty 10

## 2019-05-10 MED ORDER — SODIUM CHLORIDE 0.9 % IV SOLN
INTRAVENOUS | Status: DC | PRN
  Administered 2019-05-10: 1000 mL via INTRAVENOUS

## 2019-05-10 MED ORDER — PROMETHAZINE HCL 25 MG/ML IJ SOLN: 6.2500 mg | INTRAMUSCULAR | Status: DC | PRN

## 2019-05-10 MED ORDER — PHENYLEPHRINE HCL-NACL 10-0.9 MG/250ML-% IV SOLN
INTRAVENOUS | Status: DC | PRN
  Administered 2019-05-10: 50 ug/min via INTRAVENOUS

## 2019-05-10 MED ORDER — WARFARIN SODIUM 5 MG PO TABS
5.0000 mg | ORAL_TABLET | Freq: Once | ORAL | Status: AC
  Administered 2019-05-10: 5 mg via ORAL
  Filled 2019-05-10: qty 1

## 2019-05-10 MED ORDER — ROCURONIUM BROMIDE 10 MG/ML (PF) SYRINGE
PREFILLED_SYRINGE | INTRAVENOUS | Status: AC
  Filled 2019-05-10: qty 10

## 2019-05-10 MED ORDER — LIDOCAINE 2% (20 MG/ML) 5 ML SYRINGE
INTRAMUSCULAR | Status: AC
  Filled 2019-05-10: qty 5

## 2019-05-10 MED ORDER — HYDROMORPHONE HCL 1 MG/ML IJ SOLN
INTRAMUSCULAR | Status: AC
  Filled 2019-05-10: qty 1

## 2019-05-10 MED ORDER — PROPOFOL 10 MG/ML IV BOLUS
INTRAVENOUS | Status: DC | PRN
  Administered 2019-05-10: 120 mg via INTRAVENOUS

## 2019-05-10 MED ORDER — VANCOMYCIN HCL 1000 MG IV SOLR
INTRAVENOUS | Status: DC | PRN
  Administered 2019-05-10: 1000 mg via TOPICAL

## 2019-05-10 MED ORDER — HYDROMORPHONE HCL 1 MG/ML IJ SOLN: 2.0000 mg | Freq: Once | INTRAMUSCULAR | Status: DC

## 2019-05-10 MED ORDER — CELECOXIB 200 MG PO CAPS
400.0000 mg | ORAL_CAPSULE | Freq: Once | ORAL | Status: AC
  Administered 2019-05-10: 07:00:00 400 mg via ORAL

## 2019-05-10 MED ORDER — ACETAMINOPHEN 500 MG PO TABS
1000.0000 mg | ORAL_TABLET | Freq: Once | ORAL | Status: AC
  Administered 2019-05-10: 1000 mg via ORAL

## 2019-05-10 MED ORDER — TOBRAMYCIN SULFATE 1.2 G IJ SOLR
INTRAMUSCULAR | Status: AC
  Filled 2019-05-10: qty 1.2

## 2019-05-10 MED ORDER — LIDOCAINE 2% (20 MG/ML) 5 ML SYRINGE
INTRAMUSCULAR | Status: DC | PRN
  Administered 2019-05-10: 100 mg via INTRAVENOUS

## 2019-05-10 MED ORDER — CEFAZOLIN SODIUM-DEXTROSE 2-3 GM-%(50ML) IV SOLR
INTRAVENOUS | Status: DC | PRN
  Administered 2019-05-10: 2 g via INTRAVENOUS

## 2019-05-10 MED ORDER — PHENYLEPHRINE HCL (PRESSORS) 10 MG/ML IV SOLN
INTRAVENOUS | Status: DC | PRN
  Administered 2019-05-10 (×4): 80 ug via INTRAVENOUS

## 2019-05-10 MED ORDER — PHENYLEPHRINE 40 MCG/ML (10ML) SYRINGE FOR IV PUSH (FOR BLOOD PRESSURE SUPPORT)
PREFILLED_SYRINGE | INTRAVENOUS | Status: AC
  Filled 2019-05-10: qty 10

## 2019-05-10 MED ORDER — VANCOMYCIN HCL 1000 MG IV SOLR
INTRAVENOUS | Status: AC
  Filled 2019-05-10: qty 1000

## 2019-05-10 MED ORDER — PROPOFOL 10 MG/ML IV BOLUS
INTRAVENOUS | Status: AC
  Filled 2019-05-10: qty 20

## 2019-05-10 MED ORDER — LACTATED RINGERS IV SOLN
INTRAVENOUS | Status: DC | PRN
  Administered 2019-05-10: 08:00:00 via INTRAVENOUS

## 2019-05-10 MED ORDER — FENTANYL CITRATE (PF) 100 MCG/2ML IJ SOLN
25.0000 ug | INTRAMUSCULAR | Status: DC | PRN
  Administered 2019-05-10 (×2): 50 ug via INTRAVENOUS

## 2019-05-10 MED ORDER — VITAMIN D 25 MCG (1000 UNIT) PO TABS
2000.0000 [IU] | ORAL_TABLET | Freq: Two times a day (BID) | ORAL | Status: DC
  Administered 2019-05-10 – 2019-05-14 (×8): 2000 [IU] via ORAL
  Filled 2019-05-10 (×10): qty 2

## 2019-05-10 MED ORDER — SUGAMMADEX SODIUM 200 MG/2ML IV SOLN
INTRAVENOUS | Status: DC | PRN
  Administered 2019-05-10: 200 mg via INTRAVENOUS

## 2019-05-10 MED ORDER — CELECOXIB 200 MG PO CAPS
ORAL_CAPSULE | ORAL | Status: AC
  Administered 2019-05-10: 400 mg via ORAL
  Filled 2019-05-10: qty 2

## 2019-05-10 MED ORDER — FENTANYL CITRATE (PF) 100 MCG/2ML IJ SOLN
INTRAMUSCULAR | Status: AC
  Filled 2019-05-10: qty 2

## 2019-05-10 MED ORDER — TOBRAMYCIN SULFATE 1.2 G IJ SOLR
INTRAMUSCULAR | Status: DC | PRN
  Administered 2019-05-10: 1.2 g via TOPICAL

## 2019-05-10 MED ORDER — ROCURONIUM BROMIDE 50 MG/5ML IV SOSY
PREFILLED_SYRINGE | INTRAVENOUS | Status: DC | PRN
  Administered 2019-05-10: 50 mg via INTRAVENOUS
  Administered 2019-05-10: 20 mg via INTRAVENOUS

## 2019-05-10 MED ORDER — SODIUM CHLORIDE 0.9 % IR SOLN
Status: DC | PRN
  Administered 2019-05-10: 3000 mL

## 2019-05-10 SURGICAL SUPPLY — 97 items
APL PRP STRL LF DISP 70% ISPRP (MISCELLANEOUS) ×4
BANDAGE ESMARK 6X9 LF (GAUZE/BANDAGES/DRESSINGS) ×2 IMPLANT
BIT DRILL 2.5 X LONG (BIT) ×2
BIT DRILL CALIBR QC 2.8X250 (BIT) ×2 IMPLANT
BIT DRILL X LONG 2.5 (BIT) IMPLANT
BLADE CLIPPER SURG (BLADE) IMPLANT
BLADE SURG 10 STRL SS (BLADE) ×4 IMPLANT
BLADE SURG 15 STRL LF DISP TIS (BLADE) ×2 IMPLANT
BLADE SURG 15 STRL SS (BLADE)
BNDG CMPR 9X6 STRL LF SNTH (GAUZE/BANDAGES/DRESSINGS)
BNDG COHESIVE 4X5 TAN STRL (GAUZE/BANDAGES/DRESSINGS) ×4 IMPLANT
BNDG ELASTIC 4X5.8 VLCR STR LF (GAUZE/BANDAGES/DRESSINGS) ×6 IMPLANT
BNDG ELASTIC 6X5.8 VLCR STR LF (GAUZE/BANDAGES/DRESSINGS) ×6 IMPLANT
BNDG ESMARK 6X9 LF (GAUZE/BANDAGES/DRESSINGS)
BRUSH SCRUB EZ PLAIN DRY (MISCELLANEOUS) ×6 IMPLANT
CANISTER SUCT 3000ML PPV (MISCELLANEOUS) ×2 IMPLANT
CHLORAPREP W/TINT 26 (MISCELLANEOUS) ×8 IMPLANT
COVER SURGICAL LIGHT HANDLE (MISCELLANEOUS) ×6 IMPLANT
COVER WAND RF STERILE (DRAPES) ×4 IMPLANT
CUFF TOURN SGL QUICK 34 (TOURNIQUET CUFF)
CUFF TRNQT CYL 34X4.125X (TOURNIQUET CUFF) ×2 IMPLANT
DRAPE C-ARM 42X72 X-RAY (DRAPES) ×4 IMPLANT
DRAPE C-ARMOR (DRAPES) ×4 IMPLANT
DRAPE ORTHO SPLIT 77X108 STRL (DRAPES) ×8
DRAPE SURG ORHT 6 SPLT 77X108 (DRAPES) ×4 IMPLANT
DRAPE U-SHAPE 47X51 STRL (DRAPES) ×4 IMPLANT
DRILL BIT X LONG 2.5 (BIT) ×4
DRSG ADAPTIC 3X8 NADH LF (GAUZE/BANDAGES/DRESSINGS) ×2 IMPLANT
DRSG MEPILEX BORDER 4X8 (GAUZE/BANDAGES/DRESSINGS) ×2 IMPLANT
DRSG MEPITEL 4X7.2 (GAUZE/BANDAGES/DRESSINGS) ×2 IMPLANT
DRSG PAD ABDOMINAL 8X10 ST (GAUZE/BANDAGES/DRESSINGS) ×4 IMPLANT
ELECT CAUTERY BLADE 6.4 (BLADE) ×2 IMPLANT
ELECT REM PT RETURN 9FT ADLT (ELECTROSURGICAL) ×4
ELECTRODE REM PT RTRN 9FT ADLT (ELECTROSURGICAL) ×2 IMPLANT
GAUZE SPONGE 4X4 12PLY STRL LF (GAUZE/BANDAGES/DRESSINGS) ×4 IMPLANT
GLOVE BIO SURGEON STRL SZ 6.5 (GLOVE) ×9 IMPLANT
GLOVE BIO SURGEON STRL SZ7.5 (GLOVE) ×16 IMPLANT
GLOVE BIO SURGEONS STRL SZ 6.5 (GLOVE) ×3
GLOVE BIOGEL PI IND STRL 6.5 (GLOVE) ×2 IMPLANT
GLOVE BIOGEL PI IND STRL 7.5 (GLOVE) ×2 IMPLANT
GLOVE BIOGEL PI INDICATOR 6.5 (GLOVE) ×2
GLOVE BIOGEL PI INDICATOR 7.5 (GLOVE) ×2
GLOVE SURG SYN 7.5  E (GLOVE) ×8
GLOVE SURG SYN 7.5 E (GLOVE) ×8 IMPLANT
GLOVE SURG SYN 7.5 PF PI (GLOVE) IMPLANT
GOWN STRL REUS W/ TWL LRG LVL3 (GOWN DISPOSABLE) ×4 IMPLANT
GOWN STRL REUS W/TWL LRG LVL3 (GOWN DISPOSABLE) ×8
HANDPIECE INTERPULSE COAX TIP (DISPOSABLE) ×4
IMMOBILIZER KNEE 22 UNIV (SOFTGOODS) ×4 IMPLANT
KIT BASIN OR (CUSTOM PROCEDURE TRAY) ×4 IMPLANT
KIT TURNOVER KIT B (KITS) ×4 IMPLANT
MANIFOLD NEPTUNE II (INSTRUMENTS) ×4 IMPLANT
NDL SUT 6 .5 CRC .975X.05 MAYO (NEEDLE) ×2 IMPLANT
NEEDLE MAYO TAPER (NEEDLE) ×4
NS IRRIG 1000ML POUR BTL (IV SOLUTION) ×4 IMPLANT
PACK ORTHO EXTREMITY (CUSTOM PROCEDURE TRAY) ×2 IMPLANT
PACK TOTAL JOINT (CUSTOM PROCEDURE TRAY) ×4 IMPLANT
PAD ARMBOARD 7.5X6 YLW CONV (MISCELLANEOUS) ×10 IMPLANT
PAD CAST 4YDX4 CTTN HI CHSV (CAST SUPPLIES) ×2 IMPLANT
PADDING CAST COTTON 4X4 STRL (CAST SUPPLIES) ×8
PADDING CAST COTTON 6X4 STRL (CAST SUPPLIES) ×6 IMPLANT
PLATE LOCK VA-LCP F/3.5X117 6H (Plate) ×2 IMPLANT
SCREW CORT HEADED ST 3.5X44 (Screw) ×2 IMPLANT
SCREW HEADED ST 3.5X50 (Screw) ×2 IMPLANT
SCREW HEADED ST 3.5X85 (Screw) ×2 IMPLANT
SCREW HEADED ST 3.5X95 (Screw) ×2 IMPLANT
SCREW LOCKING 3.5X90MM VA (Screw) ×6 IMPLANT
SET HNDPC FAN SPRY TIP SCT (DISPOSABLE) IMPLANT
SPONGE LAP 18X18 RF (DISPOSABLE) ×4 IMPLANT
STAPLER VISISTAT 35W (STAPLE) ×4 IMPLANT
STOCKINETTE IMPERVIOUS 9X36 MD (GAUZE/BANDAGES/DRESSINGS) ×4 IMPLANT
SUCTION FRAZIER HANDLE 10FR (MISCELLANEOUS) ×2
SUCTION TUBE FRAZIER 10FR DISP (MISCELLANEOUS) ×2 IMPLANT
SUT ETHILON 2 0 FS 18 (SUTURE) ×4 IMPLANT
SUT ETHILON 3 0 PS 1 (SUTURE) ×8 IMPLANT
SUT FIBERWIRE #2 38 T-5 BLUE (SUTURE)
SUT MON AB 2-0 CT1 36 (SUTURE) ×6 IMPLANT
SUT PDS AB 2-0 CT1 27 (SUTURE) IMPLANT
SUT VIC AB 0 CT1 27 (SUTURE)
SUT VIC AB 0 CT1 27XBRD ANBCTR (SUTURE) IMPLANT
SUT VIC AB 1 CT1 18XCR BRD 8 (SUTURE) IMPLANT
SUT VIC AB 1 CT1 27 (SUTURE) ×4
SUT VIC AB 1 CT1 27XBRD ANBCTR (SUTURE) ×2 IMPLANT
SUT VIC AB 1 CT1 8-18 (SUTURE)
SUT VIC AB 1 CTX 18 (SUTURE) ×2 IMPLANT
SUT VIC AB 2-0 CT1 27 (SUTURE) ×8
SUT VIC AB 2-0 CT1 TAPERPNT 27 (SUTURE) ×4 IMPLANT
SUTURE FIBERWR #2 38 T-5 BLUE (SUTURE) IMPLANT
SWAB CULTURE ESWAB REG 1ML (MISCELLANEOUS) IMPLANT
TOWEL GREEN STERILE (TOWEL DISPOSABLE) ×6 IMPLANT
TOWEL GREEN STERILE FF (TOWEL DISPOSABLE) ×4 IMPLANT
TRAY FOLEY MTR SLVR 16FR STAT (SET/KITS/TRAYS/PACK) IMPLANT
TUBE CONNECTING 12'X1/4 (SUCTIONS)
TUBE CONNECTING 12X1/4 (SUCTIONS) ×2 IMPLANT
UNDERPAD 30X30 (UNDERPADS AND DIAPERS) ×4 IMPLANT
WATER STERILE IRR 1000ML POUR (IV SOLUTION) ×6 IMPLANT
YANKAUER SUCT BULB TIP NO VENT (SUCTIONS) ×2 IMPLANT

## 2019-05-10 NOTE — Plan of Care (Signed)
  Problem: Coping: Goal: Level of anxiety will decrease Outcome: Progressing   Problem: Education: Goal: Knowledge of General Education information will improve Description: Including pain rating scale, medication(s)/side effects and non-pharmacologic comfort measures Outcome: Progressing   Problem: Nutrition: Goal: Adequate nutrition will be maintained Outcome: Progressing   Problem: Coping: Goal: Level of anxiety will decrease Outcome: Progressing   Problem: Pain Managment: Goal: General experience of comfort will improve Outcome: Progressing

## 2019-05-10 NOTE — Progress Notes (Signed)
Ortho Trauma Note  Patient doing okay. Pain in left leg. No major issues. Risks and benefits discussed with patient and wife and they agree to proceed with surgery and consent was obtained. Plan for ORIF plateau with closure of fasciotomies.  Shona Needles, MD [facility] [phone] (office) [email or web address]

## 2019-05-10 NOTE — Anesthesia Postprocedure Evaluation (Signed)
Anesthesia Post Note  Patient: Jerome Newman  Procedure(s) Performed: SECONDARY CLOSURE OF WOUND RIGHT LEG (Right Leg Lower) OPEN REDUCTION INTERNAL FIXATION (ORIF) TIBIAL PLATEAU RIGHT (Right Leg Lower) Dressing Change Under Anesthesia (Left Leg Upper)     Patient location during evaluation: PACU Anesthesia Type: General Level of consciousness: sedated Pain management: pain level controlled Vital Signs Assessment: post-procedure vital signs reviewed and stable Respiratory status: spontaneous breathing and respiratory function stable Cardiovascular status: stable Postop Assessment: no apparent nausea or vomiting Anesthetic complications: no    Last Vitals:  Vitals:   05/10/19 1010 05/10/19 1037  BP: (!) 160/80 136/62  Pulse: 81 87  Resp: 19 18  Temp:    SpO2: 98% 90%    Last Pain:  Vitals:   05/10/19 0940  TempSrc:   PainSc: 0-No pain                 Jahn Franchini DANIEL

## 2019-05-10 NOTE — Progress Notes (Signed)
ANTICOAGULATION CONSULT NOTE - Initial Consult  Pharmacy Consult for Coumadin Indication: a fib, remote hx DVT/PE  Allergies  Allergen Reactions  . Gabapentin Other (See Comments)    Drowsiness   . Nsaids Other (See Comments)    NOT SUPPOSED TO TAKE DUE TO BLOOD THINNERS    Patient Measurements: Height: 6' (182.9 cm) Weight: 197 lb (89.4 kg) IBW/kg (Calculated) : 77.6  Vital Signs: Temp: 97.7 F (36.5 C) (11/08 0940) Temp Source: Oral (11/08 0240) BP: 136/62 (11/08 1037) Pulse Rate: 87 (11/08 1037)  Labs: Recent Labs    05/08/19 1310 05/08/19 1907 05/09/19 0320 05/10/19 0526  HGB 12.7* 10.2* 8.9* 7.9*  HCT 38.2* 30.0* 26.5* 24.1*  PLT 176  --  136* 103*  LABPROT 32.6*  --  18.3* 15.9*  INR 3.2*  --  1.5* 1.3*  CREATININE 1.24  --  1.16 1.30*    Estimated Creatinine Clearance: 43.9 mL/min (A) (by C-G formula based on SCr of 1.3 mg/dL (H)).   Medical History: Past Medical History:  Diagnosis Date  . Bladder cancer ([place])   . BPH (benign prostatic hyperplasia)   . GERD (gastroesophageal reflux disease)   . Pancreatitis   . Pneumonia     Assessment:  Anticoag: h/o a fib, remote hx DVT/PE. S/p OR 11/6. INR 1.3. Post-op Hgb 12.7>8.9>7.9. Plts 103 down - Vit K 5mg  IV x 1 on 11/6 pre-op - 11/6: Fasciotomy with B leg external fixaton - 11/7: IV heparin ok'd by Ortho if desired. Dr. Heber [place] desires no bridging today. - 11/8: to OR for closing of fasciotomy and R ORIF of tibial plateau fracture  - PTA warf 3.125 mg every day except Friday, when pt takes 2.5 mg with admit INR 3.2  Goal of Therapy:  INR 2-3 Monitor platelets by anticoagulation protocol: Yes   Plan:  He may be restarted on his Coumadin dose tonight per ortho with potential bridging tomorrow a.m. with either Lovenox or heparin drip per primary team.  Coumadin 5mg  po x 1 tonight Daily INR   Pelagia Iacobucci S. Alford Highland, PharmD, [facility] Clinical Staff Pharmacist [place] [place] 05/10/2019,12:06 PM

## 2019-05-10 NOTE — Op Note (Signed)
Orthopaedic Surgery Operative Note (CSN: [id number] ) Date of Surgery: 05/10/2019  Admit Date: 05/08/2019   Diagnoses: Pre-Op Diagnoses: Right lower extremity impending compartment syndrome Right bicondylar tibial plateau fracture Left intra-articular distal femur fracture Bilateral tricompartmental knee arthritis    Post-Op Diagnosis: Same  Procedures: 1. CPT 27536-Open reduction internal fixation of right bicondylar tibial plateau fracture 2. CPT 20694-Removal of external fixator right leg 3. CPT 13160-Secondary closure of right lower extremity fasciotomy wounds 4. CPT 15852-Dressing change under anesthesia left lower extremity  Surgeons : Primary: Deklyn Gibbon, Thomasene Lot, MD  Assistant: Patrecia Pace, PA-C  Location: OR 5   Anesthesia:General  Antibiotics: Ancef 2g preop   Tourniquet time:none  Estimated Blood Loss: 16RC  Complications: None  Specimens:None   Implants: Implant Name Type Inv. Item Serial No. Manufacturer Lot No. LRB No. Used Action  SCREW HEADED ST 3.5X85 - [id number] Screw SCREW HEADED ST 3.5X85  [employer]  Right 1 Implanted  SCREW HEADED ST 3.5X95 - [id number] Screw SCREW HEADED ST 3.5X95  [employer]  Right 1 Implanted  SCREW CORT HEADED ST 3.5X44 - [id number] Screw SCREW CORT HEADED ST 3.5X44  [employer]  Right 1 Implanted  SCREW LOCKING 3.5X90MM VA - [id number] Screw SCREW LOCKING 3.5X90MM VA  [employer]  Right 3 Implanted  SCREW HEADED ST 3.5X50 - [id number] Screw SCREW HEADED ST 3.5X50  [employer]  Right 1 Implanted  six hole tibial plate Plate   [employer]  Right 1 Implanted     Indications for Surgery: 85 year old male on Coumadin with a history of A. fib and previous DVT presented after injuring his bilateral lower extremities.  Went emergently for fasciotomy release along with external fixation of his right lower extremity.  He also underwent open reduction internal fixation of his left distal femur fracture.  Due to his unstable  injury on his right knee with his bicondylar tibial plateau fracture I recommended proceeding with open reduction internal fixation with closure of fasciotomy wounds.  I discussed risks and benefits with the patient and his wife.  Risks include but not limited to bleeding, infection, malunion, nonunion, hardware failure, hardware irritation, knee pain, nerve and blood vessel injury, DVT, even the possibility anesthetic complications.  They agreed to proceed with surgery and consent was obtained.  Operative Findings: 1.  Open reduction internal fixation of right bicondylar tibial plateau fracture using Synthes VA 3.5 mm LCP proximal tibial locking plate. 2.  Removal of external fixation from right lower extremity. 3.  Secondary closure of right lower extremity fasciotomy wounds. 4.  Dressing changes to the left lower extremity which appeared without any signs of infection.  Procedure: The patient was identified in the preoperative holding area. Consent was confirmed with the patient and their family and all questions were answered. The operative extremity was marked after confirmation with the patient. he was then brought back to the operating room by our anesthesia colleagues.  He was placed under general anesthetic and carefully transferred over to a radiolucent flat top table.  A bump was placed under his operative hip.  And the wound VAC was removed the muscle appeared viable and healthy.  Remove the external fixator prior to prepping the leg.  The right lower extremity was then prepped and draped in usual sterile fashion.  Timeout was performed to verify the patient procedure and extremity.  Fluoroscopic imaging was obtained to confirm the unstable nature of his injury.  I then made a lateral parapatellar incision to the proximal tibia carried  it down through skin and subcutaneous tissue.  Released the IT band just lateral to the patellar tendon.  I mobilized the interval between the IT band and the  capsule.  I then performed subperiosteal dissection along the anterior lateral cortex of the proximal tibia and released the IT band off all the way posteriorly to the fibular head.  I was able to visualize the anterior lateral split of the joint.  I performed a submeniscal arthrotomy with a 15 blade.  I used #1 Vicryl sutures for tagging of the capsule.  I was able to visualize the joint which appeared to be nearly anatomic.  I then used a reduction tenaculum and placed a percutaneous incision on the medial side to compress the lateral condyle to the medial condyle.  A 3.5 mm nonlocking screw was placed to compress and hold this reduction.  A Synthes 3.5 mm VA LCP proximal tibial locking plate was then slid submuscularly along the lateral cortex of the tibia.  It was held provisionally proximally with a K wire.  I confirmed placement with fluoroscopic imaging.  I then placed a nonlocking screw in the proximal segment to bring the plate flush to bone.  I then placed a 3.5 millimeter screw percutaneously through the lateral fasciotomy wound to bring the distal portion of the plate flush to bone.  I placed 2 more nonlocking screws into the tibial shaft.  I placed 3 locking screws in the proximal segment to complete my construct.  Final fluoroscopic images were then obtained.  The incisions were copiously irrigated.  A gram of vancomycin powder 1.2 g of tobramycin powder were placed into the wounds.  I then performed a layer closure of the lateral parapatellar incision with bringing the tag capsule through the plate and tying this down.  The IT band was closed with #1 Vicryl suture.  The remainder the incisions were closed with 2-0 Monocryl and 3-0 nylon.  The skin came together well without any difficulty.  The lower extremity was then dressed with sterile dressing cast padding and Ace wrap.  The drapes were broken down and the we changed the dressing to the left lower extremity which appeared to be healthy and  viable without any signs of infection.  The patient was then awoken from anesthesia and taken the PACU in stable condition.  Post Op Plan/Instructions: Patient will be touchdown weightbearing to the left lower extremity.  He will be weightbearing as tolerated for transfers only on the right lower extremity.  He will basically be bed to chair transfers for the next 4 to 6 weeks.  He may have unrestricted range of motion of bilateral lower extremities.  He may be restarted on his Coumadin dose tonight with potential bridging tomorrow a.m. with either Lovenox or heparin drip per primary team.  He will mobilize with physical and Occupational Therapy.  I was present and performed the entire surgery.  Patrecia Pace, PA-C did assist me throughout the case. An assistant was necessary given the difficulty in approach, maintenance of reduction and ability to instrument the fracture.   Katha Hamming, MD [facility]

## 2019-05-10 NOTE — Discharge Instructions (Addendum)
.  Orthopaedic Trauma Service Discharge Instructions   General Discharge Instructions  WEIGHT BEARING STATUS: Touchdown weightbearing bearing on left lower extremity. Weightbearing as tolerated on right lower extremity for transfers only  RANGE OF MOTION/ACTIVITY: Okay for gentle range of motion of both knees and ankles  Wound Care: Incisions can be left open to air if there is no drainage. If incision continues to have drainage, follow wound care instructions below. Okay to shower if no drainage from incisions.  DVT/PE prophylaxis: Resume home dose Coumadin  Diet: as you were eating previously.  Can use over the counter stool softeners and bowel preparations, such as Miralax, to help with bowel movements.  Narcotics can be constipating.  Be sure to drink plenty of fluids  PAIN MEDICATION USE AND EXPECTATIONS  You have likely been given narcotic medications to help control your pain.  After a traumatic event that results in an fracture (broken bone) with or without surgery, it is ok to use narcotic pain medications to help control one's pain.  We understand that everyone responds to pain differently and each individual patient will be evaluated on a regular basis for the continued need for narcotic medications. Ideally, narcotic medication use should last no more than 6-8 weeks (coinciding with fracture healing).   As a patient it is your responsibility as well to monitor narcotic medication use and report the amount and frequency you use these medications when you come to your office visit.   We would also advise that if you are using narcotic medications, you should take a dose prior to therapy to maximize you participation.  IF YOU ARE ON NARCOTIC MEDICATIONS IT IS NOT PERMISSIBLE TO OPERATE A MOTOR VEHICLE (MOTORCYCLE/CAR/TRUCK/MOPED) OR HEAVY MACHINERY DO NOT MIX NARCOTICS WITH OTHER CNS (CENTRAL NERVOUS SYSTEM) DEPRESSANTS SUCH AS ALCOHOL   STOP SMOKING OR USING NICOTINE  PRODUCTS!!!!  As discussed nicotine severely impairs your body's ability to heal surgical and traumatic wounds but also impairs bone healing.  Wounds and bone heal by forming microscopic blood vessels (angiogenesis) and nicotine is a vasoconstrictor (essentially, shrinks blood vessels).  Therefore, if vasoconstriction occurs to these microscopic blood vessels they essentially disappear and are unable to deliver necessary nutrients to the healing tissue.  This is one modifiable factor that you can do to dramatically increase your chances of healing your injury.    (This means no smoking, no nicotine gum, patches, etc)  DO NOT USE NONSTEROIDAL ANTI-INFLAMMATORY DRUGS (NSAID'S)  Using products such as Advil (ibuprofen), Aleve (naproxen), Motrin (ibuprofen) for additional pain control during fracture healing can delay and/or prevent the healing response.  If you would like to take over the counter (OTC) medication, Tylenol (acetaminophen) is ok.  However, some narcotic medications that are given for pain control contain acetaminophen as well. Therefore, you should not exceed more than 4000 mg of tylenol in a day if you do not have liver disease.  Also note that there are may OTC medicines, such as cold medicines and allergy medicines that my contain tylenol as well.  If you have any questions about medications and/or interactions please ask your doctor/PA or your pharmacist.      ICE AND ELEVATE INJURED/OPERATIVE EXTREMITY  Using ice and elevating the injured extremity above your heart can help with swelling and pain control.  Icing in a pulsatile fashion, such as 20 minutes on and 20 minutes off, can be followed.    Do not place ice directly on skin. Make sure there is a barrier  between to skin and the ice pack.    Using frozen items such as frozen peas works well as the conform nicely to the are that needs to be iced.  USE AN ACE WRAP OR TED HOSE FOR SWELLING CONTROL  In addition to icing and elevation,  Ace wraps or TED hose are used to help limit and resolve swelling.  It is recommended to use Ace wraps or TED hose until you are informed to stop.    When using Ace Wraps start the wrapping distally (farthest away from the body) and wrap proximally (closer to the body)   Example: If you had surgery on your leg or thing and you do not have a splint on, start the ace wrap at the toes and work your way up to the thigh        If you had surgery on your upper extremity and do not have a splint on, start the ace wrap at your fingers and work your way up to the upper arm   [place]: [phone]   VISIT OUR WEBSITE FOR ADDITIONAL INFORMATION: [email or web address]     Discharge Wound Care Instructions  Do NOT apply any ointments, solutions or lotions to pin sites or surgical wounds.  These prevent needed drainage and even though solutions like hydrogen peroxide kill bacteria, they also damage cells lining the pin sites that help fight infection.  Applying lotions or ointments can keep the wounds moist and can cause them to breakdown and open up as well. This can increase the risk for infection. When in doubt call the office.  Surgical incisions should be dressed daily.  If any drainage is noted, use one layer of adaptic, then gauze, Kerlix, and an ace wrap.  Once the incision is completely dry and without drainage, it may be left open to air out.  Showering may begin 36-48 hours later.  Cleaning gently with soap and water.  Traumatic wounds should be dressed daily as well.    One layer of adaptic, gauze, Kerlix, then ace wrap.  The adaptic can be discontinued once the draining has ceased    If you have a wet to dry dressing: wet the gauze with saline the squeeze as much saline out so the gauze is moist (not soaking wet), place moistened gauze over wound, then place a dry gauze over the moist one, followed by Kerlix wrap, then ace wrap.    Information on my  medicine - Coumadin   (Warfarin)  Why was Coumadin prescribed for you? Coumadin was prescribed for you because you have a blood clot or a medical condition that can cause an increased risk of forming blood clots. Blood clots can cause serious health problems by blocking the flow of blood to the heart, lung, or brain. Coumadin can prevent harmful blood clots from forming. As a reminder your indication for Coumadin is:   Stroke Prevention Because Of Atrial Fibrillation  What test will check on my response to Coumadin? While on Coumadin (warfarin) you will need to have an INR test regularly to ensure that your dose is keeping you in the desired range. The INR (international normalized ratio) number is calculated from the result of the laboratory test called prothrombin time (PT).  If an INR APPOINTMENT HAS NOT ALREADY BEEN MADE FOR YOU please schedule an appointment to have this lab work done by your health care provider within 7 days. Your INR goal is usually a number between:  2 to 3 or your provider may give you a more narrow range like 2-2.5.  Ask your health care provider during an office visit what your goal INR is.  What  do you need to  know  About  COUMADIN? Take Coumadin (warfarin) exactly as prescribed by your healthcare provider about the same time each day.  DO NOT stop taking without talking to the doctor who prescribed the medication.  Stopping without other blood clot prevention medication to take the place of Coumadin may increase your risk of developing a new clot or stroke.  Get refills before you run out.  What do you do if you miss a dose? If you miss a dose, take it as soon as you remember on the same day then continue your regularly scheduled regimen the next day.  Do not take two doses of Coumadin at the same time.  Important Safety Information A possible side effect of Coumadin (Warfarin) is an increased risk of bleeding. You should call your healthcare provider right away  if you experience any of the following: ? Bleeding from an injury or your nose that does not stop. ? Unusual colored urine (red or dark brown) or unusual colored stools (red or black). ? Unusual bruising for unknown reasons. ? A serious fall or if you hit your head (even if there is no bleeding).  Some foods or medicines interact with Coumadin (warfarin) and might alter your response to warfarin. To help avoid this: ? Eat a balanced diet, maintaining a consistent amount of Vitamin K. ? Notify your provider about major diet changes you plan to make. ? Avoid alcohol or limit your intake to 1 drink for women and 2 drinks for men per day. (1 drink is 5 oz. wine, 12 oz. beer, or 1.5 oz. liquor.)  Make sure that ANY health care provider who prescribes medication for you knows that you are taking Coumadin (warfarin).  Also make sure the healthcare provider who is monitoring your Coumadin knows when you have started a new medication including herbals and non-prescription products.  Coumadin (Warfarin)  Major Drug Interactions  Increased Warfarin Effect Decreased Warfarin Effect  Alcohol (large quantities) Antibiotics (esp. Septra/Bactrim, Flagyl, Cipro) Amiodarone (Cordarone) Aspirin (ASA) Cimetidine (Tagamet) Megestrol (Megace) NSAIDs (ibuprofen, naproxen, etc.) Piroxicam (Feldene) Propafenone (Rythmol SR) Propranolol (Inderal) Isoniazid (INH) Posaconazole (Noxafil) Barbiturates (Phenobarbital) Carbamazepine (Tegretol) Chlordiazepoxide (Librium) Cholestyramine (Questran) Griseofulvin Oral Contraceptives Rifampin Sucralfate (Carafate) Vitamin K   Coumadin (Warfarin) Major Herbal Interactions  Increased Warfarin Effect Decreased Warfarin Effect  Garlic Ginseng Ginkgo biloba Coenzyme Q10 Green tea St. Johns wort    Coumadin (Warfarin) FOOD Interactions  Eat a consistent number of servings per week of foods HIGH in Vitamin K (1 serving =  cup)  Collards (cooked, or boiled &  drained) Kale (cooked, or boiled & drained) Mustard greens (cooked, or boiled & drained) Parsley *serving size only =  cup Spinach (cooked, or boiled & drained) Swiss chard (cooked, or boiled & drained) Turnip greens (cooked, or boiled & drained)  Eat a consistent number of servings per week of foods MEDIUM-HIGH in Vitamin K (1 serving = 1 cup)  Asparagus (cooked, or boiled & drained) Broccoli (cooked, boiled & drained, or raw & chopped) Brussel sprouts (cooked, or boiled & drained) *serving size only =  cup Lettuce, raw (green leaf, endive, romaine) Spinach, raw Turnip greens, raw & chopped   These websites have more information on Coumadin (warfarin):  FailFactory.se; VeganReport.com.au;

## 2019-05-10 NOTE — Progress Notes (Signed)
   Subjective: HD#2   Overnight: No acute events ported   Today, Jerome Newman was in the OR for surgery. He was re-evaluated after his surgical procedure. He endorses post-op bilateral lower extremity pain otherwise he states that he is ready to rest.  Objective:  Vital signs in last 24 hours: Vitals:   05/09/19 1546 05/09/19 1919 05/09/19 2137 05/10/19 0240  BP: (!) 117/59 (!) 100/57 116/61 103/64  Pulse: 84 81 84 80  Resp:      Temp: 98.2 F (36.8 C) 97.8 F (36.6 C)  98.2 F (36.8 C)  TempSrc: Oral Oral  Oral  SpO2: 95% 94% 95% 90%  Weight:      Height:       Const: In no apparent distress, lying comfortably in bed, conversational Ext: Bilateral lower extremities wrapped in bandage Neuro: Sensation intact to light touch bilaterally at the lower extremities.    Assessment/Plan:  Active Problems:   Tibia/fibula fracture  Jerome Newman is an19 year old gentleman with significant PMH ofchronic diastolic heart failure, obstructive sleep apnea, chronic atrial fibrillation on Coumadin s/pAV nodal ablation and biventricular pacemaker, COPD, remote history of DVT/PE, BPH, andlung cancer s/plobectomy,whowas admittedwithaRproximal tibia fractureand Ldistal femur fractureafteratraumatic injury when a log rolled over his legs.  Tibia-fibula fracture - Rproximal tibia fractureand Ldistal femur fracture #Post-op day 2 from external fixation with closed reduction of right tibial plateau fracture, 4-compartment fasciotomy release of RLE, and open reduction internal fixation of left intra-articular distal femur fracture. #Post-op day 0 from Open reduction internal fixation of right bicondylar tibial plateau fracture, Removal of external fixator right leg, Secondary closure of right lower extremity fasciotomy wounds  Orthopedic recommendations - Touchdown weightbearing to the left lower extremity.   - Weightbearing as tolerated for transfers only on the right lower  extremity.   - Bed to chair transfers for the next 4 to 6 weeks.  - Appreciate orthopedic reccs  Acute blood loss anemia Hgb this am 7.9<8.9 (12.7<13.9) - Continue to monitor - Will likely transfuse postop given acute drop  Chronic diastolic heart failure No sign of volume overload - holding home torsemide  Chronic a fib s/pAV nodal ablation and biventricular pacemaker Remote Hx of DVT/PE His INR was 3.2 on admission and received Vit K preop.  - repeat INR 1.3 today - Start warfarin post-op today  Diet: Heart healthy Fluids: None VTE XNT:ZGYF CODE STATUS:FULL CODE  Dispo: Anticipated discharge pending further surgical intervention and functional status post-operatively.   Jerome Rosenthal, MD 05/10/2019, 6:18 AM Pager: [phone] Internal Medicine Teaching Service

## 2019-05-10 NOTE — Anesthesia Procedure Notes (Signed)
Procedure Name: Intubation Date/Time: 05/10/2019 8:47 AM Performed by: Purvis Kilts, CRNA Pre-anesthesia Checklist: Patient identified, Emergency Drugs available, Suction available, Patient being monitored and Timeout performed Patient Re-evaluated:Patient Re-evaluated prior to induction Oxygen Delivery Method: Circle system utilized Preoxygenation: Pre-oxygenation with 100% oxygen Induction Type: IV induction Ventilation: Mask ventilation without difficulty Laryngoscope Size: Mac and 4 Grade View: Grade II Tube type: Oral Tube size: 7.5 mm Number of attempts: 1 Airway Equipment and Method: Stylet Placement Confirmation: ETT inserted through vocal cords under direct vision,  positive ETCO2 and breath sounds checked- equal and bilateral Secured at: 22 cm Tube secured with: Tape Dental Injury: Teeth and Oropharynx as per pre-operative assessment

## 2019-05-10 NOTE — Transfer of Care (Signed)
Immediate Anesthesia Transfer of Care Note  Patient: Ladislaus Repsher  Procedure(s) Performed: SECONDARY CLOSURE OF WOUND RIGHT LEG (Right Leg Lower) OPEN REDUCTION INTERNAL FIXATION (ORIF) TIBIAL PLATEAU RIGHT (Right Leg Lower) Dressing Change Under Anesthesia (Left Leg Upper)  Patient Location: PACU  Anesthesia Type:General  Level of Consciousness: awake  Airway & Oxygen Therapy: Patient Spontanous Breathing and Patient connected to nasal cannula oxygen  Post-op Assessment: Report given to RN and Post -op Vital signs reviewed and stable  Post vital signs: Reviewed and stable  Last Vitals:  Vitals Value Taken Time  BP 119/53 05/10/19 0942  Temp    Pulse 83 05/10/19 0944  Resp 61 05/10/19 0944  SpO2 96 % 05/10/19 0944  Vitals shown include unvalidated device data.  Last Pain:  Vitals:   05/10/19 0240  TempSrc: Oral  PainSc:       Patients Stated Pain Goal: 2 (57/32/20 2542)  Complications: No apparent anesthesia complications

## 2019-05-11 ENCOUNTER — Inpatient Hospital Stay (HOSPITAL_COMMUNITY): Payer: Medicare Other

## 2019-05-11 DIAGNOSIS — S82141A Displaced bicondylar fracture of right tibia, initial encounter for closed fracture: Secondary | ICD-10-CM

## 2019-05-11 DIAGNOSIS — S72462A Displaced supracondylar fracture with intracondylar extension of lower end of left femur, initial encounter for closed fracture: Principal | ICD-10-CM

## 2019-05-11 DIAGNOSIS — Z9889 Other specified postprocedural states: Secondary | ICD-10-CM

## 2019-05-11 LAB — CBC
HCT: 24 % — ABNORMAL LOW (ref 39.0–52.0)
Hemoglobin: 7.9 g/dL — ABNORMAL LOW (ref 13.0–17.0)
MCH: 30 pg (ref 26.0–34.0)
MCHC: 32.9 g/dL (ref 30.0–36.0)
MCV: 91.3 fL (ref 80.0–100.0)
Platelets: 87 10*3/uL — ABNORMAL LOW (ref 150–400)
RBC: 2.63 MIL/uL — ABNORMAL LOW (ref 4.22–5.81)
RDW: 14.9 % (ref 11.5–15.5)
WBC: 6.5 10*3/uL (ref 4.0–10.5)
nRBC: 0 % (ref 0.0–0.2)

## 2019-05-11 LAB — TYPE AND SCREEN
ABO/RH(D): O POS
Antibody Screen: NEGATIVE
Unit division: 0

## 2019-05-11 LAB — BASIC METABOLIC PANEL
Anion gap: 6 (ref 5–15)
BUN: 25 mg/dL — ABNORMAL HIGH (ref 8–23)
CO2: 25 mmol/L (ref 22–32)
Calcium: 8.5 mg/dL — ABNORMAL LOW (ref 8.9–10.3)
Chloride: 106 mmol/L (ref 98–111)
Creatinine, Ser: 1.34 mg/dL — ABNORMAL HIGH (ref 0.61–1.24)
GFR calc Af Amer: 55 mL/min — ABNORMAL LOW (ref >60)
GFR calc non Af Amer: 47 mL/min — ABNORMAL LOW (ref >60)
Glucose, Bld: 97 mg/dL (ref 70–99)
Potassium: 4.4 mmol/L (ref 3.5–5.1)
Sodium: 137 mmol/L (ref 135–145)

## 2019-05-11 LAB — PROTIME-INR
INR: 1.4 — ABNORMAL HIGH (ref 0.8–1.2)
Prothrombin Time: 17 seconds — ABNORMAL HIGH (ref 11.4–15.2)

## 2019-05-11 LAB — BPAM RBC
Blood Product Expiration Date: 202012122359
ISSUE DATE / TIME: 202011082108
Unit Type and Rh: 5100

## 2019-05-11 LAB — RETICULOCYTES
Immature Retic Fract: 32.5 % — ABNORMAL HIGH (ref 2.3–15.9)
RBC.: 2.6 MIL/uL — ABNORMAL LOW (ref 4.22–5.81)
Retic Count, Absolute: 56.7 10*3/uL (ref 19.0–186.0)
Retic Ct Pct: 2.2 % (ref 0.4–3.1)

## 2019-05-11 MED ORDER — WARFARIN SODIUM 5 MG PO TABS
5.0000 mg | ORAL_TABLET | Freq: Once | ORAL | Status: AC
  Administered 2019-05-11: 5 mg via ORAL
  Filled 2019-05-11: qty 1

## 2019-05-11 MED ORDER — HYDROCODONE-ACETAMINOPHEN 5-325 MG PO TABS: 1.0000 | ORAL_TABLET | Freq: Four times a day (QID) | ORAL | 0 refills | Status: DC | PRN

## 2019-05-11 MED ORDER — PREGABALIN 75 MG PO CAPS
75.0000 mg | ORAL_CAPSULE | Freq: Two times a day (BID) | ORAL | Status: DC
  Administered 2019-05-11 – 2019-05-14 (×7): 75 mg via ORAL
  Filled 2019-05-11 (×7): qty 1

## 2019-05-11 MED ORDER — VITAMIN D3 50 MCG (2000 UT) PO CAPS: 2000.0000 [IU] | ORAL_CAPSULE | Freq: Two times a day (BID) | ORAL | 1 refills | Status: DC

## 2019-05-11 MED ORDER — ENOXAPARIN SODIUM 40 MG/0.4ML ~~LOC~~ SOLN
40.0000 mg | SUBCUTANEOUS | Status: DC
  Administered 2019-05-11 – 2019-05-12 (×2): 40 mg via SUBCUTANEOUS
  Filled 2019-05-11 (×2): qty 0.4

## 2019-05-11 NOTE — Evaluation (Signed)
Physical Therapy Evaluation Patient Details Name: Jerome Newman MRN: [id number] DOB: Oct 04, 1929 Today's Date: 05/11/2019   History of Present Illness   85 year old male with history of bladder CA, CHF, A-fib on Coumadin s/p bilateral leg injury from being struck by a large log.  Right lower extremity impending compartment syndrome s/p 4-compartment fasciotomy 05/08/19, with secondary closure 05/10/19.  Right bicondylar tibial plateau fracture s/p external fixation 05/08/19, removal of ex-fix with ORIF 05/10/19.  Left intra-articular distal femur fracture s/p ORIF 05/08/19  Clinical Impression  Pt admitted with above diagnosis. Pt was able to transfer to recliner laterally scooting with max assist of 2 and cues. Wife present and she agrees she cannot provide the level of assist for pt at home that he currently needs.  Agree with SNF and wife requests [facility] in [place].  Will follow acutely.  Pt currently with functional limitations due to the deficits listed below (see PT Problem List). Pt will benefit from skilled PT to increase their independence and safety with mobility to allow discharge to the venue listed below.     Follow Up Recommendations SNF;Supervision/Assistance - 24 hour    Equipment Recommendations  Other (comment)(TBA)    Recommendations for Other Services       Precautions / Restrictions Precautions Precautions: Fall Restrictions Weight Bearing Restrictions: Yes RLE Weight Bearing: Weight bearing as tolerated LLE Weight Bearing: Touchdown weight bearing      Mobility  Bed Mobility Overal bed mobility: Needs Assistance Bed Mobility: Supine to Sit     Supine to sit: Max assist;+2 for physical assistance;HOB elevated     General bed mobility comments: Helicopter transition to EOB with use of pad.    Transfers Overall transfer level: Needs assistance   Transfers: Lateral/Scoot Transfers          Lateral/Scoot Transfers: Max assist;+2 physical assistance;From  elevated surface General transfer comment: Pt was able to scoot to recliner and can lift Upper body with UES but needed assist to scoot laterally with use of pad for safety as well as direction.  Was able to weight bear on right LE for transfer.  Pt was in pain and needed cues for pursed lip breathing after transfer.   Ambulation/Gait                Stairs            Wheelchair Mobility    Modified Rankin (Stroke Patients Only)       Balance Overall balance assessment: Needs assistance Sitting-balance support: Feet supported;Bilateral upper extremity supported Sitting balance-Leahy Scale: Poor Sitting balance - Comments: Pt was able to sit EOB with mod assist and cues due to posterior lean at times.  Pt had a hard time obtaining his balance.   Postural control: Posterior lean                                   Pertinent Vitals/Pain Pain Assessment: Faces Faces Pain Scale: Hurts whole lot Pain Location: left LE Pain Descriptors / Indicators: Aching;Grimacing;Guarding Pain Intervention(s): Limited activity within patient's tolerance;Monitored during session;Premedicated before session;Repositioned    Home Living Family/patient expects to be discharged to:: Skilled nursing facility Living Arrangements: Spouse/significant other Available Help at Discharge: Family;Available 24 hours/day Type of Home: House Home Access: Level entry     Home Layout: One level Home Equipment: Walker - 2 wheels;Wheelchair - manual;Bedside commode;Hand held shower head;Cane - single point  Prior Function Level of Independence: Independent with assistive device(s);Needs assistance   Gait / Transfers Assistance Needed: used cane with Modif I  ADL's / Homemaking Assistance Needed: Independent with bathing and dressing        Hand Dominance   Dominant Hand: Right    Extremity/Trunk Assessment   Upper Extremity Assessment Upper Extremity Assessment: Defer to OT  evaluation    Lower Extremity Assessment Lower Extremity Assessment: RLE deficits/detail;LLE deficits/detail RLE Deficits / Details: grossly 2-/5 RLE: Unable to fully assess due to pain LLE Deficits / Details: grossly 1/5 LLE: Unable to fully assess due to pain    Cervical / Trunk Assessment Cervical / Trunk Assessment: Kyphotic  Communication   Communication: HOH  Cognition Arousal/Alertness: Awake/alert Behavior During Therapy: Restless;Anxious Overall Cognitive Status: Within Functional Limits for tasks assessed                                 General Comments: Needed wife present as pt seems to be calmer with her talking to him.  Pt very anxious about mobility.       General Comments      Exercises General Exercises - Lower Extremity Ankle Circles/Pumps: AAROM;Both;5 reps;Supine Heel Slides: AAROM;Right;5 reps;Supine Straight Leg Raises: AAROM;Right;10 reps;Supine   Assessment/Plan    PT Assessment Patient needs continued PT services  PT Problem List Decreased activity tolerance;Decreased balance;Decreased mobility;Decreased strength;Decreased range of motion;Decreased knowledge of use of DME;Decreased safety awareness;Decreased knowledge of precautions;Pain       PT Treatment Interventions DME instruction;Functional mobility training;Therapeutic activities;Therapeutic exercise;Balance training;Patient/family education;Wheelchair mobility training    PT Goals (Current goals can be found in the Care Plan section)  Acute Rehab PT Goals Patient Stated Goal: to go home after therapy PT Goal Formulation: With patient/family Time For Goal Achievement: 05/25/19 Potential to Achieve Goals: Good    Frequency Min 3X/week   Barriers to discharge        Co-evaluation               AM-PAC PT "6 Clicks" Mobility  Outcome Measure Help needed turning from your back to your side while in a flat bed without using bedrails?: Total Help needed moving  from lying on your back to sitting on the side of a flat bed without using bedrails?: Total Help needed moving to and from a bed to a chair (including a wheelchair)?: Total Help needed standing up from a chair using your arms (e.g., wheelchair or bedside chair)?: Total Help needed to walk in hospital room?: Total Help needed climbing 3-5 steps with a railing? : Total 6 Click Score: 6    End of Session Equipment Utilized During Treatment: Gait belt Activity Tolerance: Patient limited by fatigue;Patient limited by pain Patient left: in chair;with call bell/phone within reach;with chair alarm set;with family/visitor present Nurse Communication: Mobility status;Need for lift equipment PT Visit Diagnosis: Muscle weakness (generalized) (M62.81);Pain Pain - Right/Left: (bil) Pain - part of body: Leg    Time: 1200-1240 PT Time Calculation (min) (ACUTE ONLY): 40 min   Charges:   PT Evaluation $PT Eval Moderate Complexity: 1 Mod PT Treatments $Therapeutic Exercise: 8-22 mins        Faven Watterson W,PT Acute Rehabilitation Services Pager:  [phone]  Office:  [place] 05/11/2019, 1:41 PM

## 2019-05-11 NOTE — Evaluation (Signed)
Occupational Therapy Evaluation Patient Details Name: Jerome Newman MRN: [id number] DOB: 1929-07-22 Today's Date: 05/11/2019    History of Present Illness  85 year old male with history of bladder CA, CHF, A-fib on Coumadin s/p bilateral leg injury from being struck by a large log.  Right lower extremity impending compartment syndrome s/p 4-compartment fasciotomy 05/08/19, with secondary closure 05/10/19.  Right bicondylar tibial plateau fracture s/p external fixation 05/08/19, removal of ex-fix with ORIF 05/10/19.  Left intra-articular distal femur fracture s/p ORIF 05/08/19   Clinical Impression   Pt with decline in function and safety with ADLs and ADL mobility with impaired strength, balance, endurance, cognition and is HOH. Pt's wife present and reports PTA, pt used a cane for mobility and was independent with ADLs/selfcare. Pt currently requires max A +2 for bed mobility, min A with UB ADLs, total A with LB ADLs, total A with toileting and max Z +2 for lateral scoot transfer to drop arm recliner. Pt's wife agrees she cannot provide the level of assist for pt at home that he currently needs.  Agree with SNF and wife requests [facility] in [place]. Pt would benefit form acute OT services to address impairments to maximize level of function and safety    Follow Up Recommendations  SNF    Equipment Recommendations  3 in 1 bedside commode;Other (comment)(reacher, TBD at next venue of care)    Recommendations for Other Services       Precautions / Restrictions Precautions Precautions: Fall Restrictions Weight Bearing Restrictions: Yes RLE Weight Bearing: Weight bearing as tolerated LLE Weight Bearing: Touchdown weight bearing      Mobility Bed Mobility Overal bed mobility: Needs Assistance Bed Mobility: Supine to Sit     Supine to sit: Max assist;+2 for physical assistance;HOB elevated     General bed mobility comments: Helicopter transition to EOB with use of pad.     Transfers Overall transfer level: Needs assistance   Transfers: Lateral/Scoot Transfers          Lateral/Scoot Transfers: Max assist;+2 physical assistance;From elevated surface General transfer comment: Pt was able to scoot to recliner and can lift Upper body with UES but needed assist to scoot laterally with use of pad for safety as well as direction.  Was able to weight bear on right LE for transfer.  Pt was in pain and needed cues for pursed lip breathing after transfer.     Balance Overall balance assessment: Needs assistance Sitting-balance support: Feet supported;Bilateral upper extremity supported Sitting balance-Leahy Scale: Poor Sitting balance - Comments: Pt was able to sit EOB with mod assist and cues due to posterior lean at times.  Pt had a hard time obtaining his balance.   Postural control: Posterior lean                                 ADL either performed or assessed with clinical judgement   ADL Overall ADL's : Needs assistance/impaired Eating/Feeding: Independent;Sitting   Grooming: Wash/dry hands;Wash/dry face;Min guard;Sitting   Upper Body Bathing: Minimal assistance;Sitting   Lower Body Bathing: Total assistance   Upper Body Dressing : Minimal assistance;Sitting   Lower Body Dressing: Total assistance   Toilet Transfer: Maximal assistance;+2 for physical assistance(lateral scoot) Toilet Transfer Details (indicate cue type and reason): simulated bed to recliner Toileting- Clothing Manipulation and Hygiene: Total assistance               Vision Patient  Visual Report: No change from baseline       Perception     Praxis      Pertinent Vitals/Pain Pain Assessment: Faces Faces Pain Scale: Hurts whole lot Pain Location: left LE Pain Descriptors / Indicators: Aching;Grimacing;Guarding Pain Intervention(s): Limited activity within patient's tolerance;Monitored during session;Premedicated before session;Repositioned     Hand  Dominance Right   Extremity/Trunk Assessment Upper Extremity Assessment Upper Extremity Assessment: Generalized weakness   Lower Extremity Assessment Lower Extremity Assessment: Defer to PT evaluation RLE Deficits / Details: grossly 2-/5 RLE: Unable to fully assess due to pain LLE Deficits / Details: grossly 1/5 LLE: Unable to fully assess due to pain   Cervical / Trunk Assessment Cervical / Trunk Assessment: Kyphotic   Communication Communication Communication: HOH   Cognition Arousal/Alertness: Awake/alert Behavior During Therapy: Restless;Anxious Overall Cognitive Status: Within Functional Limits for tasks assessed                                 General Comments: Needed wife present as pt seems to be calmer with her talking to him.  Pt very anxious about mobility.    General Comments       Exercises Exercises: General Lower Extremity General Exercises - Lower Extremity Ankle Circles/Pumps: AAROM;Both;5 reps;Supine Heel Slides: AAROM;Right;5 reps;Supine Straight Leg Raises: AAROM;Right;10 reps;Supine   Shoulder Instructions      Home Living Family/patient expects to be discharged to:: Skilled nursing facility Living Arrangements: Spouse/significant other Available Help at Discharge: Family;Available 24 hours/day Type of Home: House Home Access: Level entry     Home Layout: One level     Bathroom Shower/Tub: Occupational psychologist: Standard     Home Equipment: Environmental consultant - 2 wheels;Wheelchair - manual;Bedside commode;Hand held shower head;Cane - single point          Prior Functioning/Environment Level of Independence: Independent with assistive device(s);Needs assistance  Gait / Transfers Assistance Needed: used cane with Modif I ADL's / Homemaking Assistance Needed: Independent with bathing and dressing   Comments: reports home was built for aging, completely accessible        OT Problem List: Decreased strength;Impaired  balance (sitting and/or standing);Decreased cognition;Decreased knowledge of precautions;Pain;Decreased safety awareness;Decreased activity tolerance;Decreased knowledge of use of DME or AE      OT Treatment/Interventions:      OT Goals(Current goals can be found in the care plan section) Acute Rehab OT Goals Patient Stated Goal: to go home after therapy OT Goal Formulation: With patient/family Time For Goal Achievement: 05/25/19 Potential to Achieve Goals: Good ADL Goals Pt Will Perform Grooming: with set-up;with supervision;sitting Pt Will Perform Upper Body Bathing: with min guard assist;sitting Pt Will Perform Lower Body Bathing: with max assist;with mod assist;sitting/lateral leans Pt Will Perform Upper Body Dressing: with min guard assist;sitting Pt Will Transfer to Toilet: with max assist;with mod assist;with +2 assist;stand pivot transfer  OT Frequency:     Barriers to D/C:            Co-evaluation              AM-PAC OT "6 Clicks" Daily Activity     Outcome Measure Help from another person eating meals?: None Help from another person taking care of personal grooming?: A Little Help from another person toileting, which includes using toliet, bedpan, or urinal?: Total Help from another person bathing (including washing, rinsing, drying)?: Total Help from another person to put on and  taking off regular upper body clothing?: A Little Help from another person to put on and taking off regular lower body clothing?: Total 6 Click Score: 13   End of Session Equipment Utilized During Treatment: Gait belt Nurse Communication: Mobility status;Other (comment)(use lift to return to bed)  Activity Tolerance: Patient tolerated treatment well Patient left: in chair;with call bell/phone within reach;with chair alarm set;with family/visitor present  OT Visit Diagnosis: Unsteadiness on feet (R26.81);Other abnormalities of gait and mobility (R26.89);Muscle weakness (generalized)  (M62.81);History of falling (Z91.81);Other symptoms and signs involving cognitive function;Pain Pain - Right/Left: (bilaterally) Pain - part of body: Leg                Time: 1201-1240 OT Time Calculation (min): 39 min Charges:  OT General Charges $OT Visit: 1 Visit OT Evaluation $OT Eval Moderate Complexity: 1 Mod    Britt Bottom 05/11/2019, 3:00 PM

## 2019-05-11 NOTE — Plan of Care (Signed)
  Problem: Activity: Goal: Ability to avoid complications of mobility impairment will improve Outcome: Progressing Goal: Ability to tolerate increased activity will improve Outcome: Progressing   Problem: Coping: Goal: Level of anxiety will decrease Outcome: Progressing   Problem: Pain Management: Goal: Pain level will decrease Outcome: Progressing   Problem: Skin Integrity: Goal: Signs of wound healing will improve Outcome: Progressing   Problem: Tissue Perfusion: Goal: Ability to maintain adequate tissue perfusion will improve Outcome: Progressing   Problem: Activity: Goal: Risk for activity intolerance will decrease Outcome: Progressing   Problem: Nutrition: Goal: Adequate nutrition will be maintained Outcome: Progressing   Problem: Elimination: Goal: Will not experience complications related to bowel motility Outcome: Progressing Goal: Will not experience complications related to urinary retention Outcome: Progressing   Problem: Safety: Goal: Ability to remain free from injury will improve Outcome: Progressing   Problem: Skin Integrity: Goal: Risk for impaired skin integrity will decrease Outcome: Progressing

## 2019-05-11 NOTE — Progress Notes (Signed)
RN notified Internal medicine doctor on call, Sheppard Coil for an order to d/c foley catheter per protocol. Was advised to keep foley due to no indication for removal.  MD also notified of patient's a.m. Hgb of 7.9, was advised that Day-shift rounding team will make recommendation.

## 2019-05-11 NOTE — Progress Notes (Addendum)
Orthopaedic Trauma Progress Note  S: Doing well this morning, pain in legs fairly well controlled. Does describe some tingling in his right foot and heel. Will start on Lyrica this morning. Also has complaint of some right ankle pain on medial aspect which he has not noted on previous exams.   O:  Vitals:   05/11/19 0020 05/11/19 0314  BP: 113/65 102/64  Pulse: 83 82  Resp: 16   Temp: (!) 97.5 F (36.4 C) 97.7 F (36.5 C)  SpO2: 99% 99%    General - Sitting up in bed, NAD. Pleasant and cooperative  Left Lower Extremity - Dressing clean, dry, intact. Mildly tender about the knee. Ankle dorsiflexion/plantarflexion intact. Tolerates minimal knee flexion. Compartments soft and compressible. Sensation grossly intact. Able to wiggle toes. Extremity is warm. 2+ DP pulse  Right Lower Extremity - Dressing in place is clean, dry, intact. Swelling about the foot and lower leg but compartments remain compressible. Tenderness with palpation of medial malleolus, less tender laterally. Tolerates small amount of knee flexion.  Ankle dorsiflexion/plantarflexion intact. Sensation grossly intact. Extremity warm.  2+ DP pulse  Imaging: Stable post op imaging.   Labs:  Results for orders placed or performed during the hospital encounter of 05/08/19 (from the past 24 hour(s))  CBC     Status: Abnormal   Collection Time: 05/10/19  4:26 PM  Result Value Ref Range   WBC 9.4 4.0 - 10.5 K/uL   RBC 2.51 (L) 4.22 - 5.81 MIL/uL   Hemoglobin 7.7 (L) 13.0 - 17.0 g/dL   HCT 23.3 (L) 39.0 - 52.0 %   MCV 92.8 80.0 - 100.0 fL   MCH 30.7 26.0 - 34.0 pg   MCHC 33.0 30.0 - 36.0 g/dL   RDW 14.6 11.5 - 15.5 %   Platelets 112 (L) 150 - 400 K/uL   nRBC 0.0 0.0 - 0.2 %  Protime-INR     Status: Abnormal   Collection Time: 05/11/19  3:16 AM  Result Value Ref Range   Prothrombin Time 17.0 (H) 11.4 - 15.2 seconds   INR 1.4 (H) 0.8 - 1.2  CBC     Status: Abnormal   Collection Time: 05/11/19  3:16 AM  Result Value Ref  Range   WBC 6.5 4.0 - 10.5 K/uL   RBC 2.63 (L) 4.22 - 5.81 MIL/uL   Hemoglobin 7.9 (L) 13.0 - 17.0 g/dL   HCT 24.0 (L) 39.0 - 52.0 %   MCV 91.3 80.0 - 100.0 fL   MCH 30.0 26.0 - 34.0 pg   MCHC 32.9 30.0 - 36.0 g/dL   RDW 14.9 11.5 - 15.5 %   Platelets 87 (L) 150 - 400 K/uL   nRBC 0.0 0.0 - 0.2 %  Basic metabolic panel     Status: Abnormal   Collection Time: 05/11/19  3:16 AM  Result Value Ref Range   Sodium 137 135 - 145 mmol/L   Potassium 4.4 3.5 - 5.1 mmol/L   Chloride 106 98 - 111 mmol/L   CO2 25 22 - 32 mmol/L   Glucose, Bld 97 70 - 99 mg/dL   BUN 25 (H) 8 - 23 mg/dL   Creatinine, Ser 1.34 (H) 0.61 - 1.24 mg/dL   Calcium 8.5 (L) 8.9 - 10.3 mg/dL   GFR calc non Af Amer 47 (L) >60 mL/min   GFR calc Af Amer 55 (L) >60 mL/min   Anion gap 6 5 - 15    Assessment: 85 year old male with history of  bladder CA, CHF, A-fib on Coumadin s/p bilateral leg injury from being struck by a large log  Injuries: 1. Right lower extremity impending compartment syndrome s/p 4-compartment fasciotomy 05/08/19, with secondary closure 05/10/19 2. Right bicondylar tibial plateau fracture s/p external fixation 05/08/19, removal of ex-fix with ORIF 05/10/19 3. Left intra-articular distal femur fracture s/p ORIF 05/08/19   Weightbearing: WBAT RLE for transfers only, TDWB LLE  Insicional and dressing care: Plan to change dressings tomorrow or Wednesday   Orthopedic device(s): None   CV/Blood loss: Acute blood loss anemia, Hgb 7.9 this AM. Hemodynamically stable. Continue to monitor CBC  Pain management:  1. Tylenol 1000 mg q 6 hours scheduled 2. Tramadol 50-100 mg q 6 hours PRN 3. Neurontin 100 mg TID 4. Dilaudid 1 mg q 3 hours PRN  VTE prophylaxis: Restarted on home dose Coumadin yesterday evening. May need bridging with Lovenox or Heparin today, will defer to primary team for this.   ID: Ancef 2gm post op, receiving last dose currently  Foley/Lines: Discontinue foley this AM, KVO IVFs  Medical  co-morbidities: Bladder CA, CHF, A-fib  Impediments to Fracture Healing: Polytrauma. Vitamin D level on low end of normal, have started patient on supplementation. Would recommend continuation of this at discharge  Dispo: PT/OT eval today. Patient will basically be bed to chair transfers for the next 4 to 6 weeks. Will likely need SNF  Have signed and placed Rx for vitamin D3 supplementation and pain medication in chart  Follow - up plan: 2 weeks after hospital discharge  Contact information:  Dr. Katha Hamming MD, Patrecia Pace PA-C   Naveed Humphres A. [facility] [phone] (office) [email or web address]

## 2019-05-11 NOTE — Progress Notes (Signed)
ANTICOAGULATION CONSULT NOTE - Initial Consult  Pharmacy Consult for Coumadin Indication: a fib, remote hx DVT/PE  Allergies  Allergen Reactions  . Gabapentin Other (See Comments)    Drowsiness   . Nsaids Other (See Comments)    NOT SUPPOSED TO TAKE DUE TO BLOOD THINNERS    Patient Measurements: Height: 6' (182.9 cm) Weight: 197 lb (89.4 kg) IBW/kg (Calculated) : 77.6  Vital Signs: Temp: 97.8 F (36.6 C) (11/09 0735) Temp Source: Oral (11/09 0735) BP: 109/70 (11/09 0735) Pulse Rate: 80 (11/09 0735)  Labs: Recent Labs    05/09/19 0320 05/10/19 0526 05/10/19 1626 05/11/19 0316  HGB 8.9* 7.9* 7.7* 7.9*  HCT 26.5* 24.1* 23.3* 24.0*  PLT 136* 103* 112* 87*  LABPROT 18.3* 15.9*  --  17.0*  INR 1.5* 1.3*  --  1.4*  CREATININE 1.16 1.30*  --  1.34*    Estimated Creatinine Clearance: 42.6 mL/min (A) (by C-G formula based on SCr of 1.34 mg/dL (H)).   Medical History: Past Medical History:  Diagnosis Date  . Bladder cancer ([place])   . BPH (benign prostatic hyperplasia)   . GERD (gastroesophageal reflux disease)   . Pancreatitis   . Pneumonia     Assessment:  Anticoag: h/o a fib, remote hx DVT/PE. S/p OR 11/6. INR 1.3>1.4. Post-op Hgb 12.7>8.9>7.9. Plts 87 down - Vit K 5mg  IV x 1 on 11/6 pre-op - 11/6: Fasciotomy with B leg external fixaton - 11/7: IV heparin ok'd by Ortho if desired. Dr. Heber [place] desires no bridging today. - 11/8: to OR for closing of fasciotomy and R ORIF of tibial plateau fracture  - PTA warf 3.125 mg every day except Friday, when pt takes 2.5 mg with admit INR 3.2  Goal of Therapy:  INR 2-3 Monitor platelets by anticoagulation protocol: Yes   Plan:  Coumadin 5mg  po x 1 tonight Daily INR May not necessarily recommend bridging with plts only 87.  Judeen Geralds S. Alford Highland, PharmD, BCPS Clinical Staff Pharmacist Eilene Ghazi Stillinger 05/11/2019,9:47 AM

## 2019-05-11 NOTE — Progress Notes (Signed)
Subjective: Pt seen at the bedside this morning. Concerned about Foley being removed and not making it the bathroom due to immobility. Willing to trial condom cath today. No other acute concerns at this time.   Objective:  Vital signs in last 24 hours: Vitals:   05/10/19 2058 05/10/19 2138 05/11/19 0020 05/11/19 0314  BP: (!) 107/53 (!) 119/51 113/65 102/64  Pulse: 82 85 83 82  Resp: 16 18 16    Temp: 98 F (36.7 C) 98.1 F (36.7 C) (!) 97.5 F (36.4 C) 97.7 F (36.5 C)  TempSrc: Oral Oral Oral Oral  SpO2: 92% 98% 99% 99%  Weight:      Height:       Physical Exam Vitals signs and nursing note reviewed.  Constitutional:      General: He is not in acute distress.    Appearance: He is not ill-appearing.     Comments: Currently on 2L [place].  Pulmonary:     Effort: Pulmonary effort is normal.  Musculoskeletal:     Comments: Bilateral LE's wrapped in ACE bandages   Skin:    General: Skin is warm and dry.     Capillary Refill: Capillary refill takes less than 2 seconds.     Comments: Multiple ecchymosis over bilateral forearms.   Neurological:     Mental Status: He is alert.    Assessment/Plan:  Active Problems:   Closed bicondylar fracture of proximal end of right tibia   Traumatic compartment syndrome of right lower extremity (HCC)   Closed displaced supracondylar fracture of distal end of left femur with intracondylar extension [facility])  Mr. Demartin is an55 year old gentleman with significant PMH ofchronic diastolic heart failure, obstructive sleep apnea, chronic atrial fibrillation on Coumadin s/pAV nodal ablation and biventricular pacemaker, COPD, remote history of DVT/PE, BPH, andlung cancer s/plobectomy,whowas admittedwithaRproximal tibia fractureand Ldistal femur fractureafteratraumatic injury when a log rolled over his legs.  Rproximal tibia fractureand Ldistal femur fracture #Post-op day 3 from external fixationwith closed reductionof right tibial  plateau fracture,4-compartment fasciotomy releaseof RLE, and open reduction internal fixation of left intra-articular distal femur fracture. #Post-op day 1 from open reduction internal fixation of right bicondylar tibial plateau fracture, removal of external fixator right leg, secondary closure of right lower extremity fasciotomy wounds  Orthopedic recommendations, appreciate their input - touchdown weightbearing to the left lower extremity - weightbearing as tolerated for transfers only on the right lower extremity  - bed to chair transfers for the next 4 to 6 weeks.   - PT/OT to evaluate for likely SNF placement  Acute blood loss anemia Hgb 7.9 this morning (<< 7.7 << 7.9). On admission 12.7, with prior baseline 13.9. Transfused 1 unit on 11/8. Suspect this is still blood loss after surgery. Pt with multiple ecchymosis over bilateral arms, none noted around chest or abdomen. No signs of active bleeding at this time. - will continue to monitor - ordered retic count and smear - could consider ferritin tomorrow, as recent transfusion would alter accuracy  Chronic diastolic heart failure No sign of volume overload - holding home torsemide  Chronic a fib s/pAV nodal ablation and biventricular pacemaker Remote Hx of DVT/PE His INR was 3.2 on admissionandreceived IV Vit K5mg  preop.  - INR 1.4 today - warfarin restarted 11/8 - pharm holding off on bridging with plts 87, continue daily INRs Given pt's high risk of developing a DVT with prior history of DVT/PE, recent surgery, and bilateral lower extremity, will begin enoxaparin as DVT ppx today.  Suspect his acute thrombocytopenia is due to consumption.   Diet:Heart healthy Fluids: None VTE ppx: enoxaparin 40mg  subQ daily CODE STATUS:FULL CODE  Dispo: Anticipated dischargepending functional status post-operatively.Pt will likely need SNF.   Ladona Horns, MD 05/11/2019, 6:32 AM Pager: [phone]

## 2019-05-12 ENCOUNTER — Encounter (HOSPITAL_COMMUNITY): Payer: Self-pay | Admitting: Student

## 2019-05-12 ENCOUNTER — Ambulatory Visit: Payer: Medicare Other | Admitting: Gastroenterology

## 2019-05-12 LAB — CBC
HCT: 26.3 % — ABNORMAL LOW (ref 39.0–52.0)
Hemoglobin: 8.6 g/dL — ABNORMAL LOW (ref 13.0–17.0)
MCH: 30.7 pg (ref 26.0–34.0)
MCHC: 32.7 g/dL (ref 30.0–36.0)
MCV: 93.9 fL (ref 80.0–100.0)
Platelets: 110 10*3/uL — ABNORMAL LOW (ref 150–400)
RBC: 2.8 MIL/uL — ABNORMAL LOW (ref 4.22–5.81)
RDW: 14.9 % (ref 11.5–15.5)
WBC: 7.7 10*3/uL (ref 4.0–10.5)
nRBC: 0.3 % — ABNORMAL HIGH (ref 0.0–0.2)

## 2019-05-12 LAB — BASIC METABOLIC PANEL
Anion gap: 10 (ref 5–15)
BUN: 21 mg/dL (ref 8–23)
CO2: 21 mmol/L — ABNORMAL LOW (ref 22–32)
Calcium: 8.9 mg/dL (ref 8.9–10.3)
Chloride: 106 mmol/L (ref 98–111)
Creatinine, Ser: 1.23 mg/dL (ref 0.61–1.24)
GFR calc Af Amer: >60 mL/min (ref >60)
GFR calc non Af Amer: 52 mL/min — ABNORMAL LOW (ref >60)
Glucose, Bld: 99 mg/dL (ref 70–99)
Potassium: 3.9 mmol/L (ref 3.5–5.1)
Sodium: 137 mmol/L (ref 135–145)

## 2019-05-12 LAB — FERRITIN: Ferritin: 29 ng/mL (ref 24–336)

## 2019-05-12 LAB — PROTIME-INR
INR: 2 — ABNORMAL HIGH (ref 0.8–1.2)
Prothrombin Time: 22.3 seconds — ABNORMAL HIGH (ref 11.4–15.2)

## 2019-05-12 MED ORDER — RAMELTEON 8 MG PO TABS
8.0000 mg | ORAL_TABLET | Freq: Every day | ORAL | Status: DC
  Filled 2019-05-12: qty 1

## 2019-05-12 MED ORDER — TORSEMIDE 10 MG PO TABS
5.0000 mg | ORAL_TABLET | Freq: Every day | ORAL | Status: DC
  Administered 2019-05-12 – 2019-05-14 (×3): 5 mg via ORAL
  Filled 2019-05-12 (×3): qty 0.5

## 2019-05-12 MED ORDER — WARFARIN SODIUM 3 MG PO TABS
3.0000 mg | ORAL_TABLET | Freq: Once | ORAL | Status: AC
  Administered 2019-05-12: 3 mg via ORAL
  Filled 2019-05-12: qty 1

## 2019-05-12 MED ORDER — RAMELTEON 8 MG PO TABS
8.0000 mg | ORAL_TABLET | Freq: Every evening | ORAL | Status: DC | PRN
  Administered 2019-05-12 – 2019-05-13 (×2): 8 mg via ORAL
  Filled 2019-05-12 (×3): qty 1

## 2019-05-12 NOTE — Progress Notes (Signed)
Physical Therapy Treatment Patient Details Name: Jerome Newman MRN: [id number] DOB: 07/26/1929 Today's Date: 05/12/2019    History of Present Illness  85 year old male with history of bladder CA, CHF, A-fib on Coumadin s/p bilateral leg injury from being struck by a large log.  Right lower extremity impending compartment syndrome s/p 4-compartment fasciotomy 05/08/19, with secondary closure 05/10/19.  Right bicondylar tibial plateau fracture s/p external fixation 05/08/19, removal of ex-fix with ORIF 05/10/19.  Left intra-articular distal femur fracture s/p ORIF 05/08/19    PT Comments    Pt admitted with above diagnosis. Pt stated that nursing got him back yesterday but standing and pivoting.  Decided to try this today.  Pt needing cues for safety as he was not following commands for technique with pt pulling up on RW and then placing all weight on UEs . Pt able to perform TDWB left LE but not really doing WBAT on right LE therefore very taxing on pt and pt could not move the RW without max assist as he had all weight on UES.  Was able to pivot this way with +2 mod assist but had to bring chair to the pt.  Explained to pt that he needs continued therapy so that he can learn to weight bear on the right LE for the transfer so that he can progress to less assist and be safe with transfers.  Asked nursing to scoot pt back to bed for safety until PT can work on standing transfers more.  Pt also understands that he needs more practice.  Will continue PT.   Pt currently with functional limitations due to balance and endurance deficits. Pt will benefit from skilled PT to increase their independence and safety with mobility to allow discharge to the venue listed below.     Follow Up Recommendations  SNF;Supervision/Assistance - 24 hour     Equipment Recommendations  Other (comment)(TBA)    Recommendations for Other Services       Precautions / Restrictions Precautions Precautions:  Fall Restrictions Weight Bearing Restrictions: Yes RLE Weight Bearing: Weight bearing as tolerated LLE Weight Bearing: Touchdown weight bearing    Mobility  Bed Mobility Overal bed mobility: Needs Assistance Bed Mobility: Supine to Sit     Supine to sit: +2 for physical assistance;HOB elevated;Mod assist     General bed mobility comments: PT held the weight of the LEs with pt able to spin around and then pulled up on PT's hand with his right hand.   Transfers Overall transfer level: Needs assistance Equipment used: Rolling walker (2 wheeled) Transfers: Sit to/from [facility] Sit to Stand: Mod assist;+2 physical assistance;From elevated surface;Min assist Stand pivot transfers: Mod assist;+2 physical assistance       General transfer comment: Pt needed cues for hand placement and even at that, pt reached for RW and pulled up on RW instead of pushing up from bed but was able to power up with this technique and 2 person mod assist. Once up, pt had all weight on UEs and was able to perform TDWB left LE but was also doing the same on the right LE alot of the time thus holding himself up with his UES.  This made it difficult to move the RW for the transfer with pt needing max assist to move RW and had to bring the chair up to the patient as he could only turn so far and then was getting slihgtly winded byt the time he sat down.  Pt  reported feeling dizzy however BP was 141/77.  Obtained a cool washcloth as well and did some LE exercise and pt improved with his dizziness after a few minutes in chair.    Ambulation/Gait             General Gait Details: N/A   Stairs             Wheelchair Mobility    Modified Rankin (Stroke Patients Only)       Balance Overall balance assessment: Needs assistance Sitting-balance support: Feet supported;Bilateral upper extremity supported Sitting balance-Leahy Scale: Fair Sitting balance - Comments: Pt sat EOB with min  guard assist prior to stand pivot   Standing balance support: Bilateral upper extremity supported;During functional activity Standing balance-Leahy Scale: Poor Standing balance comment: Pt was able to stand with RW with bil UE support and external support.                             Cognition Arousal/Alertness: Awake/alert Behavior During Therapy: WFL for tasks assessed/performed Overall Cognitive Status: Within Functional Limits for tasks assessed                                 General Comments: Followed commands much better today overall although at times still does what he thinks he should instead of following directional cues.        Exercises General Exercises - Lower Extremity Ankle Circles/Pumps: AAROM;Both;5 reps;Supine Quad Sets: AROM;Both;10 reps;Supine Gluteal Sets: AROM;Both;10 reps;Supine Long Arc Quad: AROM;Both;10 reps;Seated Heel Slides: AAROM;Right;5 reps;Supine Hip Flexion/Marching: AROM;Both;5 reps;Seated    General Comments        Pertinent Vitals/Pain Pain Assessment: Faces Faces Pain Scale: Hurts whole lot Pain Location: left LE Pain Descriptors / Indicators: Aching;Grimacing;Guarding Pain Intervention(s): Limited activity within patient's tolerance;Monitored during session;Premedicated before session;Repositioned    Home Living                      Prior Function            PT Goals (current goals can now be found in the care plan section) Acute Rehab PT Goals Patient Stated Goal: to go home after therapy Progress towards PT goals: Progressing toward goals    Frequency    Min 3X/week      PT Plan Current plan remains appropriate    Co-evaluation              AM-PAC PT "6 Clicks" Mobility   Outcome Measure  Help needed turning from your back to your side while in a flat bed without using bedrails?: A Lot Help needed moving from lying on your back to sitting on the side of a flat bed without  using bedrails?: A Lot Help needed moving to and from a bed to a chair (including a wheelchair)?: A Lot Help needed standing up from a chair using your arms (e.g., wheelchair or bedside chair)?: A Lot Help needed to walk in hospital room?: Total Help needed climbing 3-5 steps with a railing? : Total 6 Click Score: 10    End of Session Equipment Utilized During Treatment: Gait belt Activity Tolerance: Patient limited by fatigue;Patient limited by pain Patient left: in chair;with call bell/phone within reach;with chair alarm set Nurse Communication: Mobility status;Need for lift equipment(scoot pivot back to bed for safety with drop arm recliner) PT Visit Diagnosis: Muscle weakness (generalized) (  M62.81);Pain Pain - Right/Left: (bil) Pain - part of body: Leg     Time: 5947-0761 PT Time Calculation (min) (ACUTE ONLY): 28 min  Charges:  $Therapeutic Exercise: 8-22 mins $Therapeutic Activity: 8-22 mins                     Hartlee Amedee W,PT Acute Rehabilitation Services Pager:  [phone]  Office:  [place] 05/12/2019, 10:27 AM

## 2019-05-12 NOTE — Plan of Care (Signed)
  Problem: Activity: Goal: Ability to avoid complications of mobility impairment will improve Outcome: Progressing Goal: Ability to tolerate increased activity will improve Outcome: Progressing   Problem: Education: Goal: Verbalization of understanding the information provided will improve Outcome: Progressing   Problem: Respiratory: Goal: Ability to maintain a clear airway will improve Outcome: Progressing   Problem: Pain Management: Goal: Pain level will decrease Outcome: Progressing   Problem: Skin Integrity: Goal: Signs of wound healing will improve Outcome: Progressing   Problem: Tissue Perfusion: Goal: Ability to maintain adequate tissue perfusion will improve Outcome: Progressing   Problem: Activity: Goal: Risk for activity intolerance will decrease Outcome: Progressing   Problem: Nutrition: Goal: Adequate nutrition will be maintained Outcome: Progressing   Problem: Coping: Goal: Level of anxiety will decrease Outcome: Progressing   Problem: Elimination: Goal: Will not experience complications related to bowel motility Outcome: Progressing   Problem: Pain Managment: Goal: General experience of comfort will improve Outcome: Progressing   Problem: Safety: Goal: Ability to remain free from injury will improve Outcome: Progressing   Problem: Skin Integrity: Goal: Risk for impaired skin integrity will decrease Outcome: Progressing   Problem: Activity: Goal: Ability to avoid complications of mobility impairment will improve Outcome: Progressing Goal: Ability to tolerate increased activity will improve Outcome: Progressing   Problem: Education: Goal: Verbalization of understanding the information provided will improve Outcome: Progressing   Problem: Respiratory: Goal: Ability to maintain a clear airway will improve Outcome: Progressing   Problem: Pain Management: Goal: Pain level will decrease Outcome: Progressing   Problem: Skin  Integrity: Goal: Signs of wound healing will improve Outcome: Progressing   Problem: Tissue Perfusion: Goal: Ability to maintain adequate tissue perfusion will improve Outcome: Progressing   Problem: Activity: Goal: Risk for activity intolerance will decrease Outcome: Progressing   Problem: Nutrition: Goal: Adequate nutrition will be maintained Outcome: Progressing   Problem: Coping: Goal: Level of anxiety will decrease Outcome: Progressing   Problem: Elimination: Goal: Will not experience complications related to bowel motility Outcome: Progressing   Problem: Pain Managment: Goal: General experience of comfort will improve Outcome: Progressing   Problem: Safety: Goal: Ability to remain free from injury will improve Outcome: Progressing   Problem: Skin Integrity: Goal: Risk for impaired skin integrity will decrease Outcome: Progressing

## 2019-05-12 NOTE — NC FL2 (Signed)
[place] MEDICAID FL2 LEVEL OF CARE SCREENING TOOL     IDENTIFICATION  Patient Name: Jerome Newman Birthdate: 10-04-1929 Sex: male Admission Date (Current Location): 05/08/2019  [facility] [place] Number:  Herbalist and Address:  [facility] [place] [place] [address] [place], [place] [address]      Provider Number:    Attending Physician Name and Address:  Axel Filler, *  Relative Name and Phone Number:  Donel Osowski [phone]    Current Level of Care: Hospital Recommended Level of Care: [place] Prior Approval Number:    Date Approved/Denied:   PASRR Number: [id number]  Discharge Plan: SNF    Current Diagnoses: Patient Active Problem List   Diagnosis Date Noted  . Traumatic compartment syndrome of right lower extremity ([place]) 05/10/2019  . Closed displaced supracondylar fracture of distal end of left femur with intracondylar extension ([place]) 05/10/2019  . Closed bicondylar fracture of proximal end of right tibia 05/08/2019  . Acute pancreatitis 08/30/2018  . Encounter for therapeutic drug monitoring 01/03/2017  . Diastolic congestive heart failure ([place]) 12/31/2016  . Hyperlipidemia 12/31/2016  . OSA (obstructive sleep apnea) 12/31/2016  . Oxygen dependent 12/31/2016  . Chronic anticoagulation 12/31/2016  . Chronic atrial fibrillation ([place]) 12/31/2016  . Lung fibrosis ([place]) 06/01/2016  . Status post biventricular pacemaker 01/18/2016  . S/P AV nodal ablation 01/18/2016  . CAD (coronary artery disease) 09/28/2015  . COPD, mild ([place]) 09/28/2015  . Malignant neoplasm of upper lobe of left lung ([place]) 02/28/2015  . DVT (deep venous thrombosis) ([place]) 09/26/2013  . Pulmonary embolism ([place]) 08/10/2013    Orientation RESPIRATION BLADDER Height & Weight     Self, Time, Situation, Place  Normal Continent Weight: 197 lb (89.4 kg) Height:  6' (182.9 cm)  BEHAVIORAL SYMPTOMS/MOOD NEUROLOGICAL BOWEL NUTRITION STATUS      Continent Diet(House diet)  AMBULATORY STATUS COMMUNICATION OF NEEDS Skin   Supervision Verbally Normal, Surgical wounds                       Personal Care Assistance Level of Assistance  Bathing, Dressing Bathing Assistance: Maximum assistance   Dressing Assistance: Maximum assistance     Functional Limitations Info             SPECIAL CARE FACTORS FREQUENCY  PT (By licensed PT), OT (By licensed OT)     PT Frequency: 5 times a week OT Frequency: 5 times a week            Contractures      Additional Factors Info                  Current Medications (05/12/2019):  This is the current hospital active medication list Current Facility-Administered Medications  Medication Dose Route Frequency Provider Last Rate Last Dose  . 0.9 %  sodium chloride infusion   Intravenous PRN Lucious Groves, DO   Stopped at 05/11/19 0949  . acetaminophen (TYLENOL) tablet 1,000 mg  1,000 mg Oral Q6H Patrecia Pace A, PA-C   1,000 mg at 05/12/19 1242  . cholecalciferol (VITAMIN D3) tablet 2,000 Units  2,000 Units Oral BID Delray Alt, PA-C   2,000 Units at 05/12/19 1012  . enoxaparin (LOVENOX) injection 40 mg  40 mg Subcutaneous Q24H Ladona Horns, MD   40 mg at 05/12/19 1242  . HYDROmorphone (DILAUDID) injection 1 mg  1 mg Intravenous Q3H PRN Patrecia Pace A, PA-C   1 mg  at 05/10/19 2041  . ondansetron (ZOFRAN) injection 4 mg  4 mg Intravenous Q6H PRN Patrecia Pace A, PA-C   4 mg at 05/10/19 1055  . pantoprazole (PROTONIX) EC tablet 40 mg  40 mg Oral BID Patrecia Pace A, PA-C   40 mg at 05/12/19 1012  . pregabalin (LYRICA) capsule 75 mg  75 mg Oral BID Patrecia Pace A, PA-C   75 mg at 05/12/19 1012  . torsemide (DEMADEX) tablet 5 mg  5 mg Oral Daily Agyei, Obed K, MD   5 mg at 05/12/19 1242  . traMADol (ULTRAM) tablet 50-100 mg  50-100 mg Oral Q6H PRN Delray Alt, PA-C   100 mg at 05/12/19 2423  . warfarin (COUMADIN) tablet 3 mg  3 mg Oral ONCE-1800 Skeet Simmer, [facility]       . Warfarin - Pharmacist Dosing Inpatient   Does not apply q1800 Karren Cobble [facility]         Discharge Medications: Please see discharge summary for a list of discharge medications.  Relevant Imaging Results:  Relevant Lab Results:   Additional Information SS # [id number]  Atilano Median, LCSW

## 2019-05-12 NOTE — Plan of Care (Signed)

## 2019-05-12 NOTE — Progress Notes (Signed)
Orthopaedic Trauma Progress Note  S: Doing well this morning, pain in legs fairly well controlled. Was able to stand at edge of bed yesterday, tolerated this well. This morning he is concerned about swelling in his legs, states he takes Torsemide at home. I will defer to medicine team for restarting this. No issues of note.  O:  Vitals:   05/12/19 0321 05/12/19 0736  BP: 122/67 133/70  Pulse: 79 80  Resp: 17 18  Temp: 98.9 F (37.2 C) 97.7 F (36.5 C)  SpO2: 95% 95%    General - Sitting up in bed, NAD. Pleasant and cooperative  Left Lower Extremity - Dressing clean, dry, intact. Mildly tender about the knee. Ankle dorsiflexion/plantarflexion intact. Tolerates small amount of knee flexion. Compartments soft and compressible. Sensation grossly intact. Able to wiggle toes. Extremity is warm. 2+ DP pulse  Right Lower Extremity - Dressing in place is clean, dry, intact. Swelling about the foot and lower leg but compartments remain compressible. Tenderness with palpation of medial malleolus, less tender laterally. Tolerates small amount of knee flexion.  Ankle dorsiflexion/plantarflexion intact. Sensation grossly intact. Extremity warm.  2+ DP pulse  Imaging: Stable post op imaging. Ankle x-rays negative for fracture/dislocation  Labs:  Results for orders placed or performed during the hospital encounter of 05/08/19 (from the past 24 hour(s))  Protime-INR     Status: Abnormal   Collection Time: 05/12/19  3:51 AM  Result Value Ref Range   Prothrombin Time 22.3 (H) 11.4 - 15.2 seconds   INR 2.0 (H) 0.8 - 1.2  CBC     Status: Abnormal   Collection Time: 05/12/19  3:51 AM  Result Value Ref Range   WBC 7.7 4.0 - 10.5 K/uL   RBC 2.80 (L) 4.22 - 5.81 MIL/uL   Hemoglobin 8.6 (L) 13.0 - 17.0 g/dL   HCT 26.3 (L) 39.0 - 52.0 %   MCV 93.9 80.0 - 100.0 fL   MCH 30.7 26.0 - 34.0 pg   MCHC 32.7 30.0 - 36.0 g/dL   RDW 14.9 11.5 - 15.5 %   Platelets 110 (L) 150 - 400 K/uL   nRBC 0.3 (H) 0.0 - 0.2  %  Basic metabolic panel     Status: Abnormal   Collection Time: 05/12/19  3:51 AM  Result Value Ref Range   Sodium 137 135 - 145 mmol/L   Potassium 3.9 3.5 - 5.1 mmol/L   Chloride 106 98 - 111 mmol/L   CO2 21 (L) 22 - 32 mmol/L   Glucose, Bld 99 70 - 99 mg/dL   BUN 21 8 - 23 mg/dL   Creatinine, Ser 1.23 0.61 - 1.24 mg/dL   Calcium 8.9 8.9 - 10.3 mg/dL   GFR calc non Af Amer 52 (L) >60 mL/min   GFR calc Af Amer >60 >60 mL/min   Anion gap 10 5 - 15    Assessment: 85 year old male with history of bladder CA, CHF, A-fib on Coumadin s/p bilateral leg injury from being struck by a large log  Injuries: 1. Right lower extremity impending compartment syndrome s/p 4-compartment fasciotomy 05/08/19, with secondary closure 05/10/19 2. Right bicondylar tibial plateau fracture s/p external fixation 05/08/19, removal of ex-fix with ORIF 05/10/19 3. Left intra-articular distal femur fracture s/p ORIF 05/08/19   Weightbearing: WBAT RLE for transfers only, TDWB LLE  Insicional and dressing care: Plan to change dressings on Wednesday   Orthopedic device(s): None   CV/Blood loss: Acute blood loss anemia, Hgb 8.6 this AM.  Hemodynamically stable. Continue to monitor CBC  Pain management:  1. Tylenol 1000 mg q 6 hours scheduled 2. Tramadol 50-100 mg q 6 hours PRN 3. Neurontin 100 mg TID 4. Dilaudid 1 mg q 3 hours PRN  VTE prophylaxis:  Lovenox and Coumadin   ID: Ancef 2gm post op completed  Foley/Lines: No foley, KVO IVFs  Medical co-morbidities: Bladder CA, CHF, A-fib  Impediments to Fracture Healing: Polytrauma. Vitamin D level on low end of normal, have started patient on supplementation. Would recommend continuation of this at discharge  Dispo: PT/OT eval, recommending SNF. Patient will basically be bed to chair transfers for the next 4 to 6 weeks. Continue inpatient care  Have signed and placed Rx for vitamin D3 supplementation and pain medication in chart  Follow - up plan: 2 weeks after  hospital discharge  Contact information:  Dr. Katha Hamming MD, Patrecia Pace PA-C   Adilee Lemme A. [facility] [phone] (office) [email or web address]

## 2019-05-12 NOTE — Progress Notes (Signed)
   Subjective: Pt seen at the bedside this morning. Feeling well, working to lift his legs while in bed. Aware of plan for SNF at discharge. No acute concerns at this time.  Objective:  Vital signs in last 24 hours: Vitals:   05/11/19 0314 05/11/19 0735 05/11/19 1548 05/12/19 0321  BP: 102/64 109/70 99/75 122/67  Pulse: 82 80 80 79  Resp:  17 18 17   Temp: 97.7 F (36.5 C) 97.8 F (36.6 C) 97.8 F (36.6 C) 98.9 F (37.2 C)  TempSrc: Oral Oral Oral   SpO2: 99% 98% 92% 95%  Weight:      Height:       Physical Exam Vitals signs and nursing note reviewed.  Constitutional:      General: He is not in acute distress.    Appearance: He is not ill-appearing.  Pulmonary:     Effort: Pulmonary effort is normal.  Musculoskeletal:     Comments: Bilateral lower extremities wrapped in ACE bandages. Pt lifting both legs antigravity and moving them side to side.  Skin:    General: Skin is warm and dry.  Neurological:     Mental Status: He is alert.    Assessment/Plan:  Active Problems:   Closed bicondylar fracture of proximal end of right tibia   Traumatic compartment syndrome of right lower extremity (HCC)   Closed displaced supracondylar fracture of distal end of left femur with intracondylar extension [facility])  Mr. Walmsley is an54 year old gentleman with significant PMH ofchronic diastolic heart failure, obstructive sleep apnea, chronic atrial fibrillation on Coumadin s/pAV nodal ablation and biventricular pacemaker, COPD, remote history of DVT/PE, BPH, andlung cancer s/plobectomy,whowas admittedwithaRproximal tibia fractureand Ldistal femur fractureafteratraumatic injury when a log rolled over his legs.  Rproximal tibia fractureand Ldistal femur fracture #Post-op day72from external fixationwith closed reductionof right tibial plateau fracture,4-compartment fasciotomy releaseof RLE, and open reduction internal fixation of left intra-articular distal femur fracture.  #Post-op day21from open reduction internal fixation of right bicondylar tibial plateau fracture,removal of external fixator right leg,secondary closure of right lower extremity fasciotomy wounds  Orthopedic recommendations, appreciate their input - touchdown weightbearing to the left lower extremity - weightbearing as tolerated for transfers only on the right lower extremity - bed to chair transfers for the next 4 to 6 weeks.  - PT/OT recommending SNF placement  Acute blood loss anemia - improving Hgb 8.6 this morning (7.9 << 7.7 <<7.9). On admission 12.7, with prior baseline 13.9. Transfused 1 unit on 11/8. Suspect this is still recovery after blood loss from surgery.  - will continue to monitor - retic index 0.6 indicating hyperproliferation, suspect related to poor bone marrow response secondary to advanced age - ferritin ordered  Chronic diastolic heart failure No sign of volume overload - holding home torsemide  Chronic a fibs/pAV nodal ablation and biventricular pacemaker Remote Hx of DVT/PE HisINRwas3.2 on admissionandreceived IV Vit K5mg  preop.  - INR 2.0today, plts improved to 112 -warfarinrestarted 11/8 - continue daily INRs Given pt's high risk of developing a DVT with prior history of DVT/PE, recent surgery, and bilateral lower extremity, will begin enoxaparin as DVT ppx today. Would continue for 4 weeks postoperatively.  Diet:Heart healthy Fluids: None VTE ppx: enoxaparin 40mg  subQ daily CODE STATUS:FULL CODE  Dispo: Anticipated dischargepending SNF placement. Pt's information sent out, awaiting bed offers per social work.   Ladona Horns, MD 05/12/2019, 6:13 AM Pager: [phone]

## 2019-05-12 NOTE — TOC Initial Note (Signed)
Transition of Care [facility]) - Initial/Assessment Note    Patient Details  Name: Jerome Newman MRN: [id number] Date of Birth: 04-30-1930  Transition of Care [facility]) CM/SW Contact:    Atilano Median, LCSW Phone Number: 05/12/2019, 2:10 PM  Clinical Narrative:                 CSW spoke with patient and wife regarding SNF placement. Both are in agreement. Patient's information has been faxed out and awaiting bed offers. Will need an updated COVID test 24-48 hours prior to discharge. CSW will continue to follow for dispo planning.   Expected Discharge Plan: Skilled Nursing Facility Barriers to Discharge: Continued Medical Work up, No SNF bed   Patient Goals and CMS Choice Patient states their goals for this hospitalization and ongoing recovery are:: get stronger; be able to get up on my own CMS Medicare.gov Compare Post Acute Care list provided to:: Patient Represenative (must comment)(Ann Allcock [phone]) Choice offered to / list presented to : Spouse  Expected Discharge Plan and Services Expected Discharge Plan: [place] In-house Referral: Clinical Social Work   Post Acute Care Choice: [place] Living arrangements for the past 2 months: [place]                                      Prior Living Arrangements/Services Living arrangements for the past 2 months: Single Family Home Lives with:: Spouse          Need for Family Participation in Patient Care: Yes (Comment) Care giver support system in place?: Yes (comment)      Activities of Daily Living Home Assistive Devices/Equipment: Oxygen, Dentures (specify type), Cane (specify quad or straight)(2L O2 at night) ADL Screening (condition at time of admission) Patient's cognitive ability adequate to safely complete daily activities?: Yes Is the patient deaf or have difficulty hearing?: Yes Does the patient have difficulty seeing, even when wearing glasses/contacts?: No Does the  patient have difficulty concentrating, remembering, or making decisions?: No Patient able to express need for assistance with ADLs?: Yes Does the patient have difficulty dressing or bathing?: No Independently performs ADLs?: Yes (appropriate for developmental age) Does the patient have difficulty walking or climbing stairs?: Yes Weakness of Legs: Both Weakness of Arms/Hands: None  Permission Sought/Granted      Share Information with NAME: Jerome Newman     Permission granted to share info w Relationship: wife  Permission granted to share info w Contact Information: [phone]  Emotional Assessment       Orientation: : Oriented to Self, Oriented to Place, Oriented to  Time, Oriented to Situation      Admission diagnosis:  Knee pain Patient Active Problem List   Diagnosis Date Noted  . Traumatic compartment syndrome of right lower extremity ([place]) 05/10/2019  . Closed displaced supracondylar fracture of distal end of left femur with intracondylar extension ([place]) 05/10/2019  . Closed bicondylar fracture of proximal end of right tibia 05/08/2019  . Acute pancreatitis 08/30/2018  . Encounter for therapeutic drug monitoring 01/03/2017  . Diastolic congestive heart failure ([place]) 12/31/2016  . Hyperlipidemia 12/31/2016  . OSA (obstructive sleep apnea) 12/31/2016  . Oxygen dependent 12/31/2016  . Chronic anticoagulation 12/31/2016  . Chronic atrial fibrillation ([place]) 12/31/2016  . Lung fibrosis ([place]) 06/01/2016  . Status post biventricular pacemaker 01/18/2016  . S/P AV nodal ablation 01/18/2016  . CAD (coronary artery disease) 09/28/2015  .  COPD, mild ([place]) 09/28/2015  . Malignant neoplasm of upper lobe of left lung ([place]) 02/28/2015  . DVT (deep venous thrombosis) ([place]) 09/26/2013  . Pulmonary embolism ([place]) 08/10/2013   PCP:  [name]., MD Pharmacy:   [facility], [place] [address] Phone: [phone] Fax:  [phone]     Social Determinants of Health (SDOH) Interventions    Readmission Risk Interventions No flowsheet data found.

## 2019-05-12 NOTE — Progress Notes (Signed)
ANTICOAGULATION CONSULT NOTE - Follow Up Consult  Pharmacy Consult for Warfarin Indication: atrial fibrillation and remote hx PE and DVT  Allergies  Allergen Reactions  . Gabapentin Other (See Comments)    Drowsiness   . Nsaids Other (See Comments)    NOT SUPPOSED TO TAKE DUE TO BLOOD THINNERS    Patient Measurements: Height: 6' (182.9 cm) Weight: 197 lb (89.4 kg) IBW/kg (Calculated) : 77.6  Vital Signs: Temp: 97.7 F (36.5 C) (11/10 0736) Temp Source: Oral (11/10 0736) BP: 133/70 (11/10 0736) Pulse Rate: 80 (11/10 0736)  Labs: Recent Labs    05/10/19 0526 05/10/19 1626 05/11/19 0316 05/12/19 0351  HGB 7.9* 7.7* 7.9* 8.6*  HCT 24.1* 23.3* 24.0* 26.3*  PLT 103* 112* 87* 110*  LABPROT 15.9*  --  17.0* 22.3*  INR 1.3*  --  1.4* 2.0*  CREATININE 1.30*  --  1.34* 1.23    Estimated Creatinine Clearance: 46.4 mL/min (by C-G formula based on SCr of 1.23 mg/dL).  Assessment:  85 yr old male on Warfarin as prior to admission for atrial fibrillation and remote hx PE and DVT.   PTA regimen: 3.125 mg daily except 2.5 mg on Fridays.   INR 3.2 on admit 11/6 with R tibial plateau and L distal femur fractures.    Warfarin reversed with Vitamin K 5 mg IV and 2 units FFP on admit 11/6 for surgery that evening, then resumed on 11/8 after second surgery and closure.      INR up to low therapeutic (2.0) after Warfarin 5 mg daily x 2.    Lovenox 40 mg SQ q24hrs added 11/9.  Platelet count 176 on admit, trended down to 87 yesterday and up to 110 today.  Hgb improved after 1 unit PRBCs yesterday.  Goal of Therapy:  INR 2-3 Monitor platelets by anticoagulation protocol: Yes   Plan:   Warfarin 3 mg x 1 today.  Daily PT/INR.  Stop Lovenox?  Arty Baumgartner, [place] Pager: [phone] or phone: [phone] 05/12/2019,12:46 PM

## 2019-05-13 ENCOUNTER — Encounter (HOSPITAL_COMMUNITY): Payer: Self-pay | Admitting: *Deleted

## 2019-05-13 LAB — BASIC METABOLIC PANEL
Anion gap: 8 (ref 5–15)
BUN: 20 mg/dL (ref 8–23)
CO2: 26 mmol/L (ref 22–32)
Calcium: 8.9 mg/dL (ref 8.9–10.3)
Chloride: 105 mmol/L (ref 98–111)
Creatinine, Ser: 1.24 mg/dL (ref 0.61–1.24)
GFR calc Af Amer: >60 mL/min (ref >60)
GFR calc non Af Amer: 52 mL/min — ABNORMAL LOW (ref >60)
Glucose, Bld: 101 mg/dL — ABNORMAL HIGH (ref 70–99)
Potassium: 4.2 mmol/L (ref 3.5–5.1)
Sodium: 139 mmol/L (ref 135–145)

## 2019-05-13 LAB — CBC
HCT: 25.1 % — ABNORMAL LOW (ref 39.0–52.0)
Hemoglobin: 8.3 g/dL — ABNORMAL LOW (ref 13.0–17.0)
MCH: 30.3 pg (ref 26.0–34.0)
MCHC: 33.1 g/dL (ref 30.0–36.0)
MCV: 91.6 fL (ref 80.0–100.0)
Platelets: 134 10*3/uL — ABNORMAL LOW (ref 150–400)
RBC: 2.74 MIL/uL — ABNORMAL LOW (ref 4.22–5.81)
RDW: 15.1 % (ref 11.5–15.5)
WBC: 5.7 10*3/uL (ref 4.0–10.5)
nRBC: 0 % (ref 0.0–0.2)

## 2019-05-13 LAB — SARS CORONAVIRUS 2 (TAT 6-24 HRS): SARS Coronavirus 2: NEGATIVE

## 2019-05-13 LAB — PROTIME-INR
INR: 3.7 — ABNORMAL HIGH (ref 0.8–1.2)
Prothrombin Time: 35.8 seconds — ABNORMAL HIGH (ref 11.4–15.2)

## 2019-05-13 LAB — PATHOLOGIST SMEAR REVIEW

## 2019-05-13 MED ORDER — SODIUM CHLORIDE 0.9 % IV SOLN
510.0000 mg | INTRAVENOUS | Status: DC
  Filled 2019-05-13: qty 17

## 2019-05-13 NOTE — Progress Notes (Signed)
Orthopaedic Trauma Progress Note  S: Doing well this morning, pain in legs well controlled. No issues of note. Has been working on leg raises in bed. Patient states he has a history of DVT in his right leg, that extremity always stays slightly more swollen than the left. About to get up to work with therapy  O:  Vitals:   05/13/19 0355 05/13/19 0721  BP: 130/82 140/75  Pulse: 80 84  Resp: 16 17  Temp: 98.1 F (36.7 C) 98.1 F (36.7 C)  SpO2: 97% 93%    General - Sitting up in bed, NAD. Pleasant and cooperative  Left Lower Extremity - Dressing removed, incisions are clean, dry, intact. Mildly tender about the knee. Ankle dorsiflexion/plantarflexion intact. Knee flexion to about 40 degrees. Compartments soft and compressible. Sensation grossly intact. Able to wiggle toes. Extremity is warm. 2+ DP pulse  Right Lower Extremity - Dressing changed, Knee incision clean, dry, intact. Lower leg fasciotomy wounds clean, dry, intact. Redressed wounds with Mepilex. Swelling about the foot and lower leg but compartments remain compressible. Tolerates small amount of knee flexion.  Ankle dorsiflexion/plantarflexion intact. Sensation grossly intact. Extremity warm.  2+ DP pulse  Imaging: Stable post op imaging. Ankle x-rays negative for fracture/dislocation  Labs:  Results for orders placed or performed during the hospital encounter of 05/08/19 (from the past 24 hour(s))  Ferritin     Status: None   Collection Time: 05/12/19  9:50 PM  Result Value Ref Range   Ferritin 29 24 - 336 ng/mL  Protime-INR     Status: Abnormal   Collection Time: 05/13/19  4:51 AM  Result Value Ref Range   Prothrombin Time 35.8 (H) 11.4 - 15.2 seconds   INR 3.7 (H) 0.8 - 1.2  CBC     Status: Abnormal   Collection Time: 05/13/19  4:51 AM  Result Value Ref Range   WBC 5.7 4.0 - 10.5 K/uL   RBC 2.74 (L) 4.22 - 5.81 MIL/uL   Hemoglobin 8.3 (L) 13.0 - 17.0 g/dL   HCT 25.1 (L) 39.0 - 52.0 %   MCV 91.6 80.0 - 100.0 fL   MCH 30.3 26.0 - 34.0 pg   MCHC 33.1 30.0 - 36.0 g/dL   RDW 15.1 11.5 - 15.5 %   Platelets 134 (L) 150 - 400 K/uL   nRBC 0.0 0.0 - 0.2 %  Basic metabolic panel     Status: Abnormal   Collection Time: 05/13/19  4:51 AM  Result Value Ref Range   Sodium 139 135 - 145 mmol/L   Potassium 4.2 3.5 - 5.1 mmol/L   Chloride 105 98 - 111 mmol/L   CO2 26 22 - 32 mmol/L   Glucose, Bld 101 (H) 70 - 99 mg/dL   BUN 20 8 - 23 mg/dL   Creatinine, Ser 1.24 0.61 - 1.24 mg/dL   Calcium 8.9 8.9 - 10.3 mg/dL   GFR calc non Af Amer 52 (L) >60 mL/min   GFR calc Af Amer >60 >60 mL/min   Anion gap 8 5 - 15    Assessment: 85 year old male with history of bladder CA, CHF, A-fib on Coumadin s/p bilateral leg injury from being struck by a large log  Injuries: 1. Right lower extremity impending compartment syndrome s/p 4-compartment fasciotomy 05/08/19, with secondary closure 05/10/19 2. Right bicondylar tibial plateau fracture s/p external fixation 05/08/19, removal of ex-fix with ORIF 05/10/19 3. Left intra-articular distal femur fracture s/p ORIF 05/08/19   Weightbearing: WBAT RLE for  transfers only, TDWB LLE  Insicional and dressing care: Change dressings as needed.   Orthopedic device(s): None   CV/Blood loss: Acute blood loss anemia, Hgb 8.3 this AM. Hemodynamically stable. Continue to monitor CBC  Pain management:  1. Tylenol 1000 mg q 6 hours scheduled 2. Tramadol 50-100 mg q 6 hours PRN 3. Neurontin 100 mg TID 4. Dilaudid 1 mg q 3 hours PRN  VTE prophylaxis:  Lovenox and Coumadin   ID: Ancef 2gm post op completed  Foley/Lines: No foley, KVO IVFs  Medical co-morbidities: Bladder CA, CHF, A-fib  Impediments to Fracture Healing: Polytrauma. Vitamin D level on low end of normal, have started patient on supplementation. Would recommend continuation of this at discharge  Dispo: PT/OT eval, recommending SNF. Patient will basically be bed to chair transfers for the next 4 to 6 weeks. Okay for discharge  from ortho standpoint once cleared by medicine team and therapy.   Have signed and placed Rx for vitamin D3 supplementation and pain medication in chart  Follow - up plan: 2 weeks after hospital discharge  Contact information:  Dr. Katha Hamming MD, Patrecia Pace PA-C   Raeshawn Tafolla A. [facility] [phone] (office) [email or web address]

## 2019-05-13 NOTE — Progress Notes (Signed)
ANTICOAGULATION CONSULT NOTE - Follow Up Consult  Pharmacy Consult for Warfarin Indication: atrial fibrillation and remote hx PE and DVT  Allergies  Allergen Reactions  . Gabapentin Other (See Comments)    Drowsiness   . Nsaids Other (See Comments)    NOT SUPPOSED TO TAKE DUE TO BLOOD THINNERS    Patient Measurements: Height: 6' (182.9 cm) Weight: 197 lb (89.4 kg) IBW/kg (Calculated) : 77.6  Vital Signs: Temp: 98.1 F (36.7 C) (11/11 0721) Temp Source: Oral (11/11 0355) BP: 140/75 (11/11 0721) Pulse Rate: 84 (11/11 0721)  Labs: Recent Labs    05/11/19 0316 05/12/19 0351 05/13/19 0451  HGB 7.9* 8.6* 8.3*  HCT 24.0* 26.3* 25.1*  PLT 87* 110* 134*  LABPROT 17.0* 22.3* 35.8*  INR 1.4* 2.0* 3.7*  CREATININE 1.34* 1.23 1.24    Estimated Creatinine Clearance: 46.1 mL/min (by C-G formula based on SCr of 1.24 mg/dL).  Assessment:  85 yr old male on Warfarin as prior to admission for atrial fibrillation and remote hx PE and DVT.   PTA regimen: 3.125 mg daily except 2.5 mg on Fridays.   INR 3.2 on admit 11/6 with R tibial plateau and L distal femur fractures.    Warfarin reversed with Vitamin K 5 mg IV and 2 units FFP on admit 11/6 for surgery that evening, then resumed on 11/8 after second surgery and closure.      INR is now supratherapeutic (3.7) after Warfarin 5 mg daily x 2, then 3 mg x 1.  Lovenox discontinued.  Platelet count improving. Hgb low stable. No bleeding reported.  Goal of Therapy:  INR 2-3 Monitor platelets by anticoagulation protocol: Yes   Plan:   No Warfarin today.  Daily PT/INR.    Arty Baumgartner, [place] Pager: [phone] or phone: [phone] 05/13/2019,10:17 AM

## 2019-05-13 NOTE — Plan of Care (Signed)
  Problem: Activity: Goal: Ability to avoid complications of mobility impairment will improve Outcome: Progressing   Problem: Coping: Goal: Level of anxiety will decrease Outcome: Progressing   Problem: Pain Management: Goal: Pain level will decrease Outcome: Progressing   Problem: Skin Integrity: Goal: Signs of wound healing will improve Outcome: Progressing   Problem: Elimination: Goal: Will not experience complications related to bowel motility Outcome: Progressing

## 2019-05-13 NOTE — Progress Notes (Addendum)
Subjective:   [name] was seen laying in bed in good spirits. No acute concerns this morning. He was able to work with physical therapy yesterday and is awaiting discharge to rehab.   Objective:  Vital signs in last 24 hours: Vitals:   05/12/19 0736 05/12/19 1354 05/12/19 1920 05/13/19 0355  BP: 133/70 106/63 104/88 130/82  Pulse: 80 80 79 80  Resp: 18 18 18 16   Temp: 97.7 F (36.5 C) 97.6 F (36.4 C) 98 F (36.7 C) 98.1 F (36.7 C)  TempSrc: Oral Oral Oral Oral  SpO2: 95% 96% 92% 97%  Weight:      Height:       Physical Exam Vitals signs and nursing note reviewed.  Constitutional:      General: He is not in acute distress.    Appearance: He is not ill-appearing.  Pulmonary:     Effort: Pulmonary effort is normal.  Musculoskeletal:     Comments: Bilateral lower extremities wrapped in ACE bandages. No LE edema appreciated.  Skin:    General: Skin is warm and dry.  Neurological:     Mental Status: He is alert.     Comments: Moving all four extremities spontaneously  Psychiatric:        Mood and Affect: Mood normal.    Assessment/Plan:  Active Problems:   Closed bicondylar fracture of proximal end of right tibia   Traumatic compartment syndrome of right lower extremity (HCC)   Closed displaced supracondylar fracture of distal end of left femur with intracondylar extension [facility])  [name] is an81 year old gentleman with significant PMH ofchronic diastolic heart failure, obstructive sleep apnea, chronic atrial fibrillation on Coumadin s/pAV nodal ablation and biventricular pacemaker, COPD, remote history of DVT/PE, BPH, andlung cancer s/plobectomy,whowas admittedwithaRproximal tibia fractureand Ldistal femur fractureafteratraumatic injury when a log rolled over his legs.  Rproximal tibia fractureand Ldistal femur fracture #Post-op day 91from external fixationwith closed reductionof right tibial plateau fracture,4-compartment fasciotomy  releaseof RLE, and open reduction internal fixation of left intra-articular distal femur fracture. #Post-op day38fromopen reduction internal fixation of right bicondylar tibial plateau fracture,removal of external fixator right leg,secondary closure of right lower extremity fasciotomy wounds  Orthopedic recommendations, appreciate their input -touchdown weightbearing to the left lower extremity -weightbearing as tolerated for transfers only on the right lower extremity - dressing changes as needed -bed to chair transfers for the next 4 to 6 weeks. - pain management with Tylenol 1000 mg q6h, Tramadol 50-100 mg q6h PRN, Neurontin 100 mg TID, and Dilaudid 1mg  q3h PRN  - PT/OT recommending SNF placementas pt will be bed to chair transfers only for the next 4-6 weeks  Acute blood loss anemia - stable Hgb8.3 today (8.6 << 7.9 << 7.7). On admission12.7, with prior baseline13.9. Transfused 1 uniton11/8. Suspect this is still recovery after blood loss from surgery.  - retic index 0.6 indicating hyperproliferation - ferritin 29 - ordered IV iron 510mg  today  Chronic diastolic heart failure No sign of volume overload - holding home torsemide  Chronic a fibs/pAV nodal ablation and biventricular pacemaker Remote Hx of DVT/PE HisINRwas3.2 on admissionandreceivedIVVit K5mg preop.  - INR 3.7today, plts improved to 134 - continue daily INRs - holding warfarin and enoxaparin today  Pt will need DVT ppx for 4 weeks post-operatively due to high risk of developing a DVT with prior history of DVT/PE, recent surgery, and bilateral lower extremity immobility.    Diet:Heart healthy Fluids: None VTE WJX:BJYNWGN enoxaparin 40mg  subQ daily CODE STATUS:FULL CODE  Dispo: Anticipated dischargepending SNF placement. COVID testing placed today.   Jerome Horns, MD 05/13/2019, 6:12 AM Pager: [phone]

## 2019-05-13 NOTE — Progress Notes (Signed)
Physical Therapy Treatment Patient Details Name: Jerome Newman MRN: [id number] DOB: 12-11-1929 Today's Date: 05/13/2019    History of Present Illness  85 year old male with history of bladder CA, CHF, A-fib on Coumadin s/p bilateral leg injury from being struck by a large log.  Right lower extremity impending compartment syndrome s/p 4-compartment fasciotomy 05/08/19, with secondary closure 05/10/19.  Right bicondylar tibial plateau fracture s/p external fixation 05/08/19, removal of ex-fix with ORIF 05/10/19.  Left intra-articular distal femur fracture s/p ORIF 05/08/19    PT Comments    Pt tolerated co-tx with OT well in terms of pain, but was limited by fatigue. He demonstrates improvement with bed mobility but requires mod/max A +3 in order to transfer from bed to bedside commode and from commode to chair. Unlike last session, he was not WB through his arms nearly as much but had a tendency to try and WB through L LE and was unable to hold up his L LE during activity on his own. SPT and OT assisted him with maintaining stability with activity and PT assisted him with maintaining TDWB on the L. He fatigues very quickly and stated he became dizzy after transfer to commode and transfer to chair. He was left in chair with a cold washcloth applied to his forehead per pt request. Pt would benefit from continued skilled PT in order to improve functional mobility and address impairments.   Follow Up Recommendations  SNF;Supervision/Assistance - 24 hour     Equipment Recommendations  Other (comment)(TBA)    Recommendations for Other Services       Precautions / Restrictions Precautions Precautions: Fall Restrictions Weight Bearing Restrictions: Yes RLE Weight Bearing: Weight bearing as tolerated(transfers only) LLE Weight Bearing: Touchdown weight bearing    Mobility  Bed Mobility Overal bed mobility: Needs Assistance Bed Mobility: Supine to Sit     Supine to sit: HOB elevated;Mod assist      General bed mobility comments: Pt was able to spin his body around mostly on his own but requires some assistance with holding the weight of his LEs as he moves them off the bed.  Transfers Overall transfer level: Needs assistance Equipment used: Rolling walker (2 wheeled) Transfers: Sit to/from [employer] Transfers Sit to Stand: Max assist;Mod assist(+3 for physical assistance)   Squat pivot transfers: Mod assist;Max assist(+3 for physical assistance)     General transfer comment: Pt continues to require cues for hand placement. He required mod/max +3 for transfer from bed to beside commode with RW with third person helping maintain TDWB on L. He was unable to get up from bedside commode so did a mod/max squat pivot transfer with two people beside him for stability and power up and one person behind helping move his buttocks.  Ambulation/Gait                 Stairs             Wheelchair Mobility    Modified Rankin (Stroke Patients Only)       Balance Overall balance assessment: Needs assistance Sitting-balance support: Feet supported;Bilateral upper extremity supported Sitting balance-Leahy Scale: Fair Sitting balance - Comments: Pt sat EOB with min guard assist prior to stand pivot   Standing balance support: Bilateral upper extremity supported;During functional activity Standing balance-Leahy Scale: Poor Standing balance comment: Pt was able to stand with RW with bil UE support and external support for a few seconds but fatigues quickly.  Cognition Arousal/Alertness: Awake/alert Behavior During Therapy: WFL for tasks assessed/performed Overall Cognitive Status: Within Functional Limits for tasks assessed                                 General Comments: Followed commands better today overall. Occassionally will demonstrate understanding of an instruction but then switch to  what he wants to do (i.e. with hand placement during transfers)      Exercises General Exercises - Lower Extremity Ankle Circles/Pumps: AAROM;Both;10 reps;Seated Long Arc Quad: AROM;Both;10 reps;Seated    General Comments General comments (skin integrity, edema, etc.): Emptied urinal with 350 mL.      Pertinent Vitals/Pain Pain Assessment: Faces Faces Pain Scale: Hurts even more Pain Location: left LE Pain Descriptors / Indicators: Aching;Grimacing;Guarding Pain Intervention(s): Limited activity within patient's tolerance;Monitored during session;Repositioned    Home Living                      Prior Function            PT Goals (current goals can now be found in the care plan section) Acute Rehab PT Goals Patient Stated Goal: to go home after therapy Progress towards PT goals: Progressing toward goals    Frequency    Min 3X/week      PT Plan Current plan remains appropriate    Co-evaluation PT/OT/SLP Co-Evaluation/Treatment: Yes(OT) Reason for Co-Treatment: For patient/therapist safety;To address functional/ADL transfers PT goals addressed during session: Mobility/safety with mobility;Proper use of DME;Strengthening/ROM;Balance        AM-PAC PT "6 Clicks" Mobility   Outcome Measure  Help needed turning from your back to your side while in a flat bed without using bedrails?: A Lot Help needed moving from lying on your back to sitting on the side of a flat bed without using bedrails?: A Lot Help needed moving to and from a bed to a chair (including a wheelchair)?: A Lot Help needed standing up from a chair using your arms (e.g., wheelchair or bedside chair)?: A Lot Help needed to walk in hospital room?: Total Help needed climbing 3-5 steps with a railing? : Total 6 Click Score: 10    End of Session Equipment Utilized During Treatment: Gait belt Activity Tolerance: Patient limited by fatigue;Patient limited by pain Patient left: in chair;with call  bell/phone within reach;with chair alarm set Nurse Communication: Mobility status;Need for lift equipment PT Visit Diagnosis: Muscle weakness (generalized) (M62.81);Pain Pain - Right/Left: (bil) Pain - part of body: Leg     Time: 1552-0802 PT Time Calculation (min) (ACUTE ONLY): 27 min  Charges:  $Therapeutic Activity: 8-22 mins                     Silvana Newness, SPT   Whitney Ayodeji Keimig 05/13/2019, 11:44 AM

## 2019-05-13 NOTE — Progress Notes (Signed)
Occupational Therapy Treatment Patient Details Name: Jerome Newman MRN: [id number] DOB: Mar 06, 1930 Today's Date: 05/13/2019    History of present illness  85 year old male with history of bladder CA, CHF, A-fib on Coumadin s/p bilateral leg injury from being struck by a large log.  Right lower extremity impending compartment syndrome s/p 4-compartment fasciotomy 05/08/19, with secondary closure 05/10/19.  Right bicondylar tibial plateau fracture s/p external fixation 05/08/19, removal of ex-fix with ORIF 05/10/19.  Left intra-articular distal femur fracture s/p ORIF 05/08/19   OT comments  Pt making slow progress with functional goals. Sat EOB with mod A, max A with LB bathing sitting on BSC. He demonstrates improvement with bed mobility but requires mod/max A +3 in order to transfer from bed to bedside commode and from commode to chair. Unlike last session, he was not WB through his arms nearly as much but had a tendency to try and WB through L LE and was unable to hold up his L LE during activity on his own. He fatigues very quickly and stated he became dizzy after transfer to commode and transfer to chair. He was left in chair with a cold washcloth applied to his forehead per pt request. OT will continue to follow acutely  Follow Up Recommendations  SNF    Equipment Recommendations  3 in 1 bedside commode;Other (comment)(TBD at next venue of care)    Recommendations for Other Services      Precautions / Restrictions Precautions Precautions: Fall Restrictions Weight Bearing Restrictions: Yes RLE Weight Bearing: Weight bearing as tolerated LLE Weight Bearing: Touchdown weight bearing       Mobility Bed Mobility Overal bed mobility: Needs Assistance Bed Mobility: Supine to Sit     Supine to sit: HOB elevated;Mod assist     General bed mobility comments: Pt was able to spin his body around mostly on his own but requires some assistance with holding the weight of his LEs as he moves  them off the bed.  Transfers Overall transfer level: Needs assistance Equipment used: Rolling walker (2 wheeled) Transfers: Sit to/from [employer] Transfers Sit to Stand: Max assist;Mod assist(+3 for physical assist)   Squat pivot transfers: Mod assist;Max assist;+2 physical assistance     General transfer comment: Pt continues to require cues for hand placement. He required mod/max +3 for transfer from bed to beside commode with RW with third person helping maintain TDWB on L. He was unable to get up from bedside commode so did a mod/max squat pivot transfer with two people beside him for stability and power up and one person behind helping move his buttocks.    Balance Overall balance assessment: Needs assistance Sitting-balance support: Feet supported;Bilateral upper extremity supported Sitting balance-Leahy Scale: Fair Sitting balance - Comments: Pt sat EOB with min guard assist prior to stand pivot   Standing balance support: Bilateral upper extremity supported;During functional activity Standing balance-Leahy Scale: Poor Standing balance comment: Pt was able to stand with RW with bil UE support and external support for a few seconds but fatigues quickly.                           ADL either performed or assessed with clinical judgement   ADL Overall ADL's : Needs assistance/impaired     Grooming: Wash/dry hands;Wash/dry face;Min guard;Sitting   Upper Body Bathing: Minimal assistance;Sitting   Lower Body Bathing: Maximal assistance;Sitting/lateral leans   Upper Body Dressing : Min guard;Sitting  Lower Body Dressing: Total assistance   Toilet Transfer: Moderate assistance;Maximal assistance;BSC;Squat-pivot;Cueing for safety;Cueing for sequencing;+2 for physical assistance Toilet Transfer Details (indicate cue type and reason): +3 assist from bed - BSC, cues for hand placement Toileting- Clothing Manipulation and Hygiene: Total  assistance       Functional mobility during ADLs: Moderate assistance;Maximal assistance;+2 for physical assistance(+3 assist)       Vision Patient Visual Report: No change from baseline     Perception     Praxis      Cognition Arousal/Alertness: Awake/alert Behavior During Therapy: WFL for tasks assessed/performed Overall Cognitive Status: Within Functional Limits for tasks assessed                                 General Comments: Followed commands better today overall. Occassionally will demonstrate understanding of an instruction but then switch to what he wants to do (i.e. with hand placement during transfers)        Exercises Exercises: General Lower Extremity General Exercises - Lower Extremity Ankle Circles/Pumps: AAROM;Both;10 reps;Seated Long Arc Quad: AROM;Both;10 reps;Seated   Shoulder Instructions       General Comments Emptied urinal with 350 mL.    Pertinent Vitals/ Pain       Pain Assessment: Faces Faces Pain Scale: Hurts even more Pain Location: left LE Pain Descriptors / Indicators: Aching;Grimacing;Guarding Pain Intervention(s): Limited activity within patient's tolerance;Monitored during session;Repositioned  Home Living                                          Prior Functioning/Environment              Frequency  Min 2X/week        Progress Toward Goals  OT Goals(current goals can now be found in the care plan section)  Progress towards OT goals: Progressing toward goals  Acute Rehab OT Goals Patient Stated Goal: to go home after therapy  Plan Discharge plan remains appropriate    Co-evaluation    PT/OT/SLP Co-Evaluation/Treatment: Yes Reason for Co-Treatment: For patient/therapist safety;To address functional/ADL transfers PT goals addressed during session: Mobility/safety with mobility;Proper use of DME;Strengthening/ROM;Balance OT goals addressed during session: ADL's and  self-care;Proper use of Adaptive equipment and DME      AM-PAC OT "6 Clicks" Daily Activity     Outcome Measure   Help from another person eating meals?: None Help from another person taking care of personal grooming?: A Little Help from another person toileting, which includes using toliet, bedpan, or urinal?: Total Help from another person bathing (including washing, rinsing, drying)?: A Lot Help from another person to put on and taking off regular upper body clothing?: A Little Help from another person to put on and taking off regular lower body clothing?: Total 6 Click Score: 14    End of Session Equipment Utilized During Treatment: Gait belt  OT Visit Diagnosis: Unsteadiness on feet (R26.81);Other abnormalities of gait and mobility (R26.89);Muscle weakness (generalized) (M62.81);History of falling (Z91.81);Other symptoms and signs involving cognitive function;Pain Pain - Right/Left: Left(bilaterally, L) Pain - part of body: Leg   Activity Tolerance Patient tolerated treatment well   Patient Left in chair;with call bell/phone within reach;with chair alarm set   Nurse Communication          Time: 7416-3845 OT Time Calculation (min): 27 min  Charges:  OT General Charges $OT Visit: 1 Visit OT Treatments $Therapeutic Activity: 8-22 mins     Britt Bottom 05/13/2019, 12:55 PM

## 2019-05-14 DIAGNOSIS — E611 Iron deficiency: Secondary | ICD-10-CM

## 2019-05-14 DIAGNOSIS — Z888 Allergy status to other drugs, medicaments and biological substances status: Secondary | ICD-10-CM

## 2019-05-14 DIAGNOSIS — Z886 Allergy status to analgesic agent status: Secondary | ICD-10-CM

## 2019-05-14 LAB — BASIC METABOLIC PANEL
Anion gap: 10 (ref 5–15)
BUN: 16 mg/dL (ref 8–23)
CO2: 25 mmol/L (ref 22–32)
Calcium: 9 mg/dL (ref 8.9–10.3)
Chloride: 106 mmol/L (ref 98–111)
Creatinine, Ser: 1.05 mg/dL (ref 0.61–1.24)
GFR calc Af Amer: >60 mL/min (ref >60)
GFR calc non Af Amer: >60 mL/min (ref >60)
Glucose, Bld: 108 mg/dL — ABNORMAL HIGH (ref 70–99)
Potassium: 4.4 mmol/L (ref 3.5–5.1)
Sodium: 141 mmol/L (ref 135–145)

## 2019-05-14 LAB — CBC
HCT: 26.3 % — ABNORMAL LOW (ref 39.0–52.0)
Hemoglobin: 8.8 g/dL — ABNORMAL LOW (ref 13.0–17.0)
MCH: 30 pg (ref 26.0–34.0)
MCHC: 33.5 g/dL (ref 30.0–36.0)
MCV: 89.8 fL (ref 80.0–100.0)
Platelets: 155 10*3/uL (ref 150–400)
RBC: 2.93 MIL/uL — ABNORMAL LOW (ref 4.22–5.81)
RDW: 15.2 % (ref 11.5–15.5)
WBC: 6.6 10*3/uL (ref 4.0–10.5)
nRBC: 0.3 % — ABNORMAL HIGH (ref 0.0–0.2)

## 2019-05-14 LAB — PROTIME-INR
INR: 3.8 — ABNORMAL HIGH (ref 0.8–1.2)
Prothrombin Time: 37.1 seconds — ABNORMAL HIGH (ref 11.4–15.2)

## 2019-05-14 MED ORDER — WARFARIN SODIUM 2.5 MG PO TABS: 2.5000 mg | ORAL_TABLET | ORAL | 3 refills | Status: DC

## 2019-05-14 MED ORDER — POLYSACCHARIDE IRON COMPLEX 150 MG PO CAPS: 150.0000 mg | ORAL_CAPSULE | Freq: Every day | ORAL | 3 refills | Status: DC

## 2019-05-14 MED ORDER — POLYETHYLENE GLYCOL 3350 17 G PO PACK: 17.0000 g | PACK | Freq: Every day | ORAL | 0 refills | Status: DC

## 2019-05-14 MED ORDER — POLYSACCHARIDE IRON COMPLEX 150 MG PO CAPS
150.0000 mg | ORAL_CAPSULE | Freq: Every day | ORAL | Status: DC
  Administered 2019-05-14: 150 mg via ORAL
  Filled 2019-05-14: qty 1

## 2019-05-14 NOTE — Progress Notes (Signed)
ANTICOAGULATION CONSULT NOTE - Initial Consult  Pharmacy Consult for Coumadin Indication: a fib, remote hx DVT/PE  Allergies  Allergen Reactions  . Gabapentin Other (See Comments)    Drowsiness   . Nsaids Other (See Comments)    NOT SUPPOSED TO TAKE DUE TO BLOOD THINNERS    Patient Measurements: Height: 6' (182.9 cm) Weight: 197 lb (89.4 kg) IBW/kg (Calculated) : 77.6  Vital Signs: Temp: 98.1 F (36.7 C) (11/12 0737) Temp Source: Oral (11/12 0737) BP: 127/79 (11/12 0737) Pulse Rate: 80 (11/12 0737)  Labs: Recent Labs    05/12/19 0351 05/13/19 0451 05/14/19 0319  HGB 8.6* 8.3* 8.8*  HCT 26.3* 25.1* 26.3*  PLT 110* 134* 155  LABPROT 22.3* 35.8* 37.1*  INR 2.0* 3.7* 3.8*  CREATININE 1.23 1.24 1.05    Estimated Creatinine Clearance: 54.4 mL/min (by C-G formula based on SCr of 1.05 mg/dL).   Medical History: Past Medical History:  Diagnosis Date  . Bladder cancer ([place])   . BPH (benign prostatic hyperplasia)   . GERD (gastroesophageal reflux disease)   . Pancreatitis   . Pneumonia     Assessment:   Anticoag: warfarin for hx a fib, remote hx DVT/PE. S/p OR 11/6. INR with rapid rise up to 3.8 today. Hgb 8.8 stable. Plts up to 155  - Vit K 5mg  IV x 1 and 2 units FFP on 11/6 pre-op - 11/6: Fasciotomy with B leg external fixaton - 11/7: IV heparin ok'd by Ortho if desired. Dr. Heber [place] desires no bridging today. - 11/8: to OR for closing of fasciotomy and R ORIF of tibial plateau fracture  - PTA warf 3.125 mg every day except Friday, when pt takes 2.5 mg with admit INR 3.2  Goal of Therapy:  INR 2-3 Monitor platelets by anticoagulation protocol: Yes   Plan:  Con't to hold warfarin Daily INR  Warner Laduca S. Alford Highland, PharmD, BCPS Clinical Staff Pharmacist Eilene Ghazi Stillinger 05/14/2019,8:13 AM

## 2019-05-14 NOTE — TOC Transition Note (Signed)
Transition of Care [facility]) - CM/SW Discharge Note   Patient Details  Name: Jerome Newman MRN: [id number] Date of Birth: 10/14/1929  Transition of Care [facility]) CM/SW Contact:  Atilano Median, LCSW Phone Number: 05/14/2019, 2:10 PM   Clinical Narrative:    Patient discharged to [facility]. Referral coordinated with [facility] [phone]. Patient's wife [name] aware and agreeable to this plan. Number to call report [phone] given to unit RN Tammy. No other needs at this time. Case closed to this CSW.    Final next level of care: Skilled Nursing Facility Barriers to Discharge: Barriers Resolved   Patient Goals and CMS Choice Patient states their goals for this hospitalization and ongoing recovery are:: get stronger; be able to get up on my own CMS Medicare.gov Compare Post Acute Care list provided to:: Patient Represenative (must comment)([name] [phone]) Choice offered to / list presented to : Spouse  Discharge Placement PASRR number recieved: 05/12/19            Patient chooses bed at: Other - please specify in the comment section below:([facility]) Patient to be transferred to facility by: [place] Name of family member notified: [name] Patient and family notified of of transfer: 05/14/19  Discharge Plan and Services In-house Referral: Clinical Social Work   Post Acute Care Choice: [place]                               Social Determinants of Health (SDOH) Interventions     Readmission Risk Interventions No flowsheet data found.

## 2019-05-14 NOTE — Progress Notes (Signed)
Physical Therapy Treatment Patient Details Name: Jerome Newman MRN: [id number] DOB: [birth year] Today's Date: 05/14/2019    History of Present Illness  85 year old male with history of bladder CA, CHF, A-fib on Coumadin s/p bilateral leg injury from being struck by a large log.  Right lower extremity impending compartment syndrome s/p 4-compartment fasciotomy 05/08/19, with secondary closure 05/10/19.  Right bicondylar tibial plateau fracture s/p external fixation 05/08/19, removal of ex-fix with ORIF 05/10/19.  Left intra-articular distal femur fracture s/p ORIF 05/08/19    PT Comments    Pt shows improvement with transfers today and was able to pivot on his R LE better than in previous sessions. He still requires help with moving the walker and cannot pivot very much due to apprehension to put more weight on R LE. Towards the end of the transfer, he started to WB 25% on his L and required cueing to maintain TDWB. Pt notably fatigued following transfer to chair today, where he became nauseous and started vomiting. He stated that he is not experiencing vertigo but gets very lightheaded and nauseous with mobility. He was able to perform long arc quads better today but still shows difficulty on the L LE and notes that this exercise is very painful. Pt was left in the chair with a cold washcloth applied to his head per pt request. He would benefit from continued skilled PT in order to address deficits and return to PLOF.   Follow Up Recommendations  SNF;Supervision/Assistance - 24 hour     Equipment Recommendations  Other (comment)(TBA)    Recommendations for Other Services       Precautions / Restrictions Precautions Precautions: Fall Restrictions Weight Bearing Restrictions: Yes RLE Weight Bearing: Weight bearing as tolerated LLE Weight Bearing: Touchdown weight bearing    Mobility  Bed Mobility Overal bed mobility: Needs Assistance Bed Mobility: Supine to Sit     Supine to sit: HOB  elevated;Mod assist     General bed mobility comments: Pt was able to spin his body around mostly on his own but requires some assistance with holding the weight of his LEs as he moves them off the bed. SPT assisted with bed pad.  Transfers Overall transfer level: Needs assistance Equipment used: Rolling walker (2 wheeled) Transfers: Sit to/from [employer] Sit to Stand: Mod assist;+2 physical assistance Stand pivot transfers: Mod assist;+2 physical assistance       General transfer comment: Pt still has difficulty putting weight on R LE which makes it difficult for him to pivot. He was able to pivot a little better today than in the past, however, but still requires mod A for stability and safety.  Ambulation/Gait                 Stairs             Wheelchair Mobility    Modified Rankin (Stroke Patients Only)       Balance Overall balance assessment: Needs assistance Sitting-balance support: Feet supported;Bilateral upper extremity supported Sitting balance-Leahy Scale: Fair Sitting balance - Comments: Pt sat EOB with min guard assist prior to stand pivot   Standing balance support: Bilateral upper extremity supported;During functional activity Standing balance-Leahy Scale: Poor Standing balance comment: Pt was able to stand with RW with bil UE support and external support for a few seconds but fatigues quickly.  Cognition Arousal/Alertness: Awake/alert;Lethargic Behavior During Therapy: WFL for tasks assessed/performed Overall Cognitive Status: Within Functional Limits for tasks assessed                                 General Comments: Followed commands better today overall. Occassionally will demonstrate understanding of an instruction but then switch to what he wants to do (i.e. with hand placement during transfers)      Exercises General Exercises - Lower Extremity Ankle  Circles/Pumps: AROM;Both;10 reps;Supine Long Arc Quad: Strengthening;Both;5 reps;Seated Hip Flexion/Marching: Strengthening;Both;5 reps;Seated    General Comments        Pertinent Vitals/Pain Pain Assessment: Faces Faces Pain Scale: Hurts even more Pain Location: left LE Pain Descriptors / Indicators: Aching;Grimacing;Guarding Pain Intervention(s): Limited activity within patient's tolerance;Monitored during session;Repositioned    Home Living                      Prior Function            PT Goals (current goals can now be found in the care plan section) Acute Rehab PT Goals Patient Stated Goal: to go home after therapy Progress towards PT goals: Progressing toward goals    Frequency    Min 3X/week      PT Plan Current plan remains appropriate    Co-evaluation              AM-PAC PT "6 Clicks" Mobility   Outcome Measure  Help needed turning from your back to your side while in a flat bed without using bedrails?: A Lot Help needed moving from lying on your back to sitting on the side of a flat bed without using bedrails?: A Lot Help needed moving to and from a bed to a chair (including a wheelchair)?: A Lot Help needed standing up from a chair using your arms (e.g., wheelchair or bedside chair)?: A Lot Help needed to walk in hospital room?: Total Help needed climbing 3-5 steps with a railing? : Total 6 Click Score: 10    End of Session Equipment Utilized During Treatment: Gait belt Activity Tolerance: Patient limited by fatigue;Patient limited by pain Patient left: in chair;with call bell/phone within reach;with chair alarm set Nurse Communication: Mobility status PT Visit Diagnosis: Muscle weakness (generalized) (M62.81);Pain Pain - Right/Left: (bil) Pain - part of body: Leg     Time: 1610-9604 PT Time Calculation (min) (ACUTE ONLY): 27 min  Charges:  $Therapeutic Exercise: 8-22 mins $Therapeutic Activity: 8-22 mins                      Silvana Newness, SPT   Jerome Newman 05/14/2019, 1:55 PM

## 2019-05-14 NOTE — Discharge Summary (Signed)
Name: Jerome Newman MRN: [id number] DOB: 08-20-1929 85 y.o. PCP: [name]., MD  Date of Admission: 05/08/2019 12:20 PM Date of Discharge: 05/14/2019 Attending Physician: Jerome Newman, *  Discharge Diagnosis: 1. Right Proximal Tibia Fracture 2. Left Distal Femur Fracture 3. Acute Blood Loss Anemia 4. Fe deficiency 5. Chronic A Fib  Discharge Medications: Allergies as of 05/14/2019      Reactions   Gabapentin Other (See Comments)   Drowsiness   Nsaids Other (See Comments)   NOT SUPPOSED TO TAKE DUE TO BLOOD THINNERS      Medication List    STOP taking these medications   meloxicam 15 MG tablet Commonly known as: MOBIC   Meloxicam 5 MG Caps   potassium chloride SA 20 MEQ tablet Commonly known as: KLOR-CON     TAKE these medications   acetaminophen 500 MG tablet Commonly known as: TYLENOL Take 1,000 mg by mouth 2 (two) times daily.   HYDROcodone-acetaminophen 5-325 MG tablet Commonly known as: NORCO/VICODIN Take 1 tablet by mouth every 6 (six) hours as needed for severe pain.   iron polysaccharides 150 MG capsule Commonly known as: NIFEREX Take 1 capsule (150 mg total) by mouth daily. Start taking on: May 15, 2019   OXYGEN Inhale 2 L/min into the lungs at bedtime.   pantoprazole 40 MG tablet Commonly known as: PROTONIX Take 40 mg by mouth 2 (two) times daily.   solifenacin 5 MG tablet Commonly known as: VESICARE Take 5 mg by mouth every other day.   Systane Preservative Free 0.4-0.3 % Soln Generic drug: Polyethyl Glyc-Propyl Glyc PF Place 1-2 drops into both eyes 3 (three) times daily as needed (for dryness).   torsemide 10 MG tablet Commonly known as: DEMADEX TAKE 1 TABLET BY MOUTH ONCE DAILY   Vitamin D3 50 MCG (2000 UT) capsule Take 1 capsule (2,000 Units total) by mouth 2 (two) times daily.   warfarin 2.5 MG tablet Commonly known as: COUMADIN Take as directed. If you are unsure how to take this medication, talk to your  nurse or doctor. Original instructions: Take 1-1.5 tablets (2.5-3.75 mg total) by mouth See admin instructions. Take 3.125 mg by mouth at bedtime on Sun/Mon/Tues/Wed/Thurs/Sat and 2.5 mg on Fri  PLEASE HOLD WARFARIN ON 05/14/2019 AND 05/15/2019 due to INR of 3.8. Will need repeat INR What changed:   how much to take  how to take this  when to take this  additional instructions       Disposition and follow-up:   Jerome Newman was discharged from [facility] in Stable condition.  At the hospital follow up visit please address:  1.   Right Proximal Tibia Fracture and Left Distal Femur Fracture - follow-up with orthopedics in 2 weeks - touchdown weightbearing to the left lower extremity -weightbearing as tolerated for transfers only on the right lower extremity -bed to chair transfers for the next 4 to 6 weeks. - dressing changes as needed  On Warfarin for chronic A Fib and remote history of DVT/PE - INR supra-therapeutic at 3.8 on discharge - last warfarin dose was 5mg  on 11/10  - HOLD WARFARIN ON 11/12 AND 11/13 - follow-up INR on 11/14  - manage per provider pending results of INR on 11/14, may consider putting pt back to his home regimen  Acute Blood Loss Anemia - Hgb on admission 12.7 (prior baseline of 13.9) - holding stable at approximately 8.8 on discharge  Fe deficiency - Ferritin 29 - Total iron deficient  of 1200-1800mg  - continue oral iron supplementation   2.  Labs / imaging needed at time of follow-up: NONE  3.  Pending labs/ test needing follow-up: NONE  Follow-up Appointments: Follow-up Information    Haddix, Thomasene Lot, MD. Schedule an appointment as soon as possible for a visit in 2 week(s).   Specialty: Orthopedic Surgery Why: For suture removal, repeat x-rays Contact information: [place] [address] [phone]           Hospital Course by problem list: 1. Right Proximal Tibia Fracture and Left  Distal Femur Fracture Pt presented on 11/6 with bilateral lower extremity trauma after a log rolled over his legs. X-rays revealed a right proximal tibia and left distal femur fractures. Orthopedics evaluated the pt and were concerned for compartment syndrome due to pt's exam and being on warfarin. He underwent external fixationwith closed reductionof right tibial plateau fracture,4-compartment fasciotomy releaseof RLE, and open reduction internal fixation of left intra-articular distal femur fracture on the evening of 11/6. He returned to the OR suite on 11/8 foropen reduction internal fixation of right bicondylar tibial plateau fracture,removal of external fixator right leg,and secondary closure of right lower extremity fasciotomy wounds. Pt was evaluated post-operatively by PT who recommended SNF placement for the pt. He is restricted to touchdown weightbearing to the left lower extremity and weightbearing as tolerated for transfers only on the right lower extremity, with bed to chair transfers for the next 4 to 6 weeks.His home warfarin will be his DVT ppx. He should have dressing changes as needed and follow-up with ortho in 2 weeks.  2. On Warfarin for chronic A Fib and remote history of DVT/PE INRwas3.2 on admissionandreceivedIVVit K5mg preop. Post-op INR fell to 1.5. Anticoagulation was held on 11/7 due to pt going back to the OR again on 11/8. Was bridged with enoxaparin on 11/9 and 11/10 while his INR was 1.4 and 2.0, respectively. He received 5mg  warfarin on 11/9 and 11/10. His INR rose to 3.7 on 11/11 and 3.8 on 11/12, so warfarin was held both day. Please continue holding on 11/13 and recheck the INR on 11/14. Manage per provider pending results of INR on 11/14, could consider putting pt back to his home regimen  3. Acute Blood Loss Anemia and Fe deficiency Hgb on admission 12.7 (prior baseline of 13.9), dropped to 8.9 > 7.9 > 7.7. He was transfused 1 unit pRBCs on 11/8. His  anemia was attributed to surgical blood loss and slow bone marrow response. Pt remained hemodynamically stable and had no signs of acute bleeding. Hgb held stable and >8 for the following days. Retic index was 0.6. His Ferritin was 29 with a calculated total iron deficient of 1200-1800mg . He will need continued oral supplementation.   Discharge Vitals:   BP 127/79 (BP Location: Right Arm)   Pulse 80   Temp 98.1 F (36.7 C) (Oral)   Resp 18   Ht 6' (1.829 m)   Wt 89.4 kg   SpO2 98%   BMI 26.72 kg/m   Pertinent Labs, Studies, and Procedures:  CBC Latest Ref Rng & Units 05/14/2019 05/13/2019 05/12/2019  WBC 4.0 - 10.5 K/uL 6.6 5.7 7.7  Hemoglobin 13.0 - 17.0 g/dL 8.8(L) 8.3(L) 8.6(L)  Hematocrit 39.0 - 52.0 % 26.3(L) 25.1(L) 26.3(L)  Platelets 150 - 400 K/uL 155 134(L) 110(L)   BMP Latest Ref Rng & Units 05/14/2019 05/13/2019 05/12/2019  Glucose 70 - 99 mg/dL 108(H) 101(H) 99  BUN 8 - 23 mg/dL 16 20  21  Creatinine 0.61 - 1.24 mg/dL 1.05 1.24 1.23  BUN/Creat Ratio 10 - 24 - - -  Sodium 135 - 145 mmol/L 141 139 137  Potassium 3.5 - 5.1 mmol/L 4.4 4.2 3.9  Chloride 98 - 111 mmol/L 106 105 106  CO2 22 - 32 mmol/L 25 26 21(L)  Calcium 8.9 - 10.3 mg/dL 9.0 8.9 8.9   Lab Results  Component Value Date   INR 3.8 (H) 05/14/2019   INR 3.7 (H) 05/13/2019   INR 2.0 (H) 05/12/2019   Lab Results  Component Value Date   VD25OH 32.71 05/09/2019   Lab Results  Component Value Date   FERRITIN 29 05/12/2019   Lab Results  Component Value Date   RETICCTPCT 2.2 05/11/2019   Blood Smear 05/11/2019 Path Review Normocytic anemia with polychromasia and ellipocytes   Comment: Thrombocytopenia     X-ray Knee Left 05/08/2019 CLINICAL DATA:  Left knee pain after a log rolled over his legs today.  EXAM: LEFT KNEE - COMPLETE 4+ VIEW  COMPARISON:  None.  FINDINGS: Comminuted distal femur fracture at the metaphysis with posterior displacement and angulation of the distal fragment as  well as some overlapping of the fragments. Marked lateral joint space narrowing. No visible effusion. Atheromatous arterial calcifications. The bones are osteopenic.  IMPRESSION: 1. Comminuted distal femur fracture with posterior displacement and angulation. 2. Marked lateral knee joint space narrowing.   X-ray Knee Right 05/08/2019 CLINICAL DATA:  Right knee pain after a log rolled across his legs today.  EXAM: RIGHT KNEE - COMPLETE 4+ VIEW  COMPARISON:  None.  FINDINGS: Comminuted fracture of the proximal tibia involving the metaphysis and epiphysis, extending into the joint space laterally. This is involving the medial and lateral tibial plateaus. No significant displacement or angulation. Small to moderate-sized effusion. Moderate patellofemoral and lateral compartment spur formation. Moderate to marked medial joint space narrowing and associated mild spur formation. Atheromatous arterial calcifications.  IMPRESSION: 1. Comminuted proximal tibia fracture with intra-articular extension. 2. Small to moderate-sized effusion. 3. Tricompartmental degenerative changes.   CT Knee Right 05/08/2019 CLINICAL DATA:  Tibial plateau fracture.  EXAM: CT OF THE RIGHT KNEE WITHOUT CONTRAST  TECHNIQUE: Multidetector CT imaging of the right knee was performed according to the standard protocol. Multiplanar CT image reconstructions were also generated.  COMPARISON:  Right knee x-rays from same day.  FINDINGS: Bones/Joint/Cartilage  Acute comminuted fracture of the proximal tibia with transverse component through the metaphysis and longitudinal component through the lateral tibial plateau. No extension into the medial tibial plateau. Slight lateral displacement of the dominant fragment. Acute nondisplaced fracture of the anterior fibular head.  Moderate to severe medial and moderate lateral patellofemoral compartment joint space narrowing. Tricompartmental  osteophytes. Moderate lipohemarthrosis. Moderate Baker cyst with small amount of layering hemorrhage. Osteopenia.  Ligaments  Ligaments are suboptimally evaluated by CT.  Muscles and Tendons Grossly intact.  Soft tissue Soft tissue swelling. No fluid collection or hematoma. No soft tissue mass.  IMPRESSION: 1. Acute split fracture of the lateral tibial plateau with nondisplaced transverse fracture of the tibial metaphysis. 2. Acute nondisplaced fracture of the anterior fibular head. 3. Moderate lipohemarthrosis. 4. 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  X-ray Left Femur 05/08/2019 CLINICAL DATA:  ORIF  EXAM: LEFT FEMUR 2 VIEWS  COMPARISON:  May 08, 2019  FINDINGS: The patient is status post ORIF of the distal femur. The osseous alignment is improved. The hardware is intact. There are expected postsurgical changes.  IMPRESSION: Improved  osseous alignment status post ORIF of the left femur.   X-ray Right Tibia/Fibula 05/08/2019 CLINICAL DATA:  Postop  EXAM: PORTABLE RIGHT TIBIA AND FIBULA - 2 VIEW  COMPARISON:  May 08, 2019  FINDINGS: The patient has undergone external fixation of the right lower extremity. There is a displaced tibial plateau fracture involving the right tibia. There is a large joint effusion with lipohemarthrosis. Vascular calcifications are noted.  IMPRESSION: Status post ex fix placement of the right lower extremity.   Discharge Instructions: Discharge Instructions    Diet - low sodium heart healthy   Complete by: As directed    Discharge instructions   Complete by: As directed    Mr. Neidert,  It was a pleasure taking care of you here in the hospital.  You were admitted because of fractures in your left and right leg.  This was fixed by the orthopedic surgeons here in the hospital.  We will continue both the warfarin and Lovenox at least for the next 4 weeks to prevent  you from developing blood clots in your leg and lungs.  Your rehab facility will check your INR and administer the warfarin based on the lab number.  Please follow-up with the orthopedic surgeon in 2 weeks.   Increase activity slowly   Complete by: As directed       Signed: Ladona Horns, MD 05/14/2019, 12:03 PM   Pager: [phone]

## 2019-05-14 NOTE — Progress Notes (Signed)
Just spoke with dispatch at [facility], anticipate pick up in about 45 minutes. Waiting for [facility] unit to be available to come pick pt up. Will inform pt and his wife.

## 2019-05-14 NOTE — Progress Notes (Signed)
Transport services was re-contacted prior to 1700 to arrange for transport to [facility].  Pt's wife was able to talk to one of the pt's physical therapist and Dr Ronnald Ramp was able to update pt d/c instructions. (and pt's wife was given time to spend with the pt prior to him having to go to SNF where she will not be able to physically go and see him in person, pt will have to have 14 day quarantine at facility due to covid guidelines.    Pt ready and waiting for [facility] to arrive. Wife at pt's bedside. Copy of d/c instructions were reviewed and given to pt/wife, and a copy placed in pt's packet for facility.   Will call facility report shortly.

## 2019-05-14 NOTE — Progress Notes (Signed)
Waiting for [facility] to arrive for pt transport to SNF. Attempting to call report to facility, [facility].  Nurse Frankey Poot not aware of pt being admitted this evening, needing to check with someone else.  Nurse Frankey Poot will take report at this time after talking with his supervisor.  Report given, await [facility] for pick up.

## 2019-05-14 NOTE — Progress Notes (Signed)
   Subjective: Pt seen at the bedside this morning. Frustrated with limited mobility due to weightbearing limitations on LEs and asking about logistics of SNF.  He endorses improved strength with PT in the hospital. Explained that short-term rehab would continue this trajectory of improving his functional status. Pt expressed understanding and had no further questions.   Objective:  Vital signs in last 24 hours: Vitals:   05/13/19 0721 05/13/19 1545 05/13/19 1910 05/14/19 0327  BP: 140/75 (!) 114/56 122/69 110/75  Pulse: 84 80 83 81  Resp: 17 17 16 18   Temp: 98.1 F (36.7 C) (!) 97.4 F (36.3 C) 98 F (36.7 C) 97.9 F (36.6 C)  TempSrc:  Oral Oral Oral  SpO2: 93% 97% 96% 95%  Weight:      Height:       Physical Exam Vitals signs and nursing note reviewed.  Constitutional:      General: He is not in acute distress.    Appearance: He is not ill-appearing.  Musculoskeletal:     Comments: RLE wrapped in ACE bandage. LLE with sutures in place over lateral thigh/knee. No LE edema notes. Pt moving all extremities spontaneously.  Skin:    Comments: Multiple large senile ecchymosis over bilateral UEs  Neurological:     Mental Status: He is alert.    Assessment/Plan:  Active Problems:   Closed bicondylar fracture of proximal end of right tibia   Traumatic compartment syndrome of right lower extremity (HCC)   Closed displaced supracondylar fracture of distal end of left femur with intracondylar extension [facility])  Mr. Ploeger is an71 year old gentleman with significant PMH ofchronic diastolic heart failure, obstructive sleep apnea, chronic atrial fibrillation on Coumadin s/pAV nodal ablation and biventricular pacemaker, COPD, remote history of DVT/PE, BPH, andlung cancer s/plobectomy,whowas admittedwithaRproximal tibia fractureand Ldistal femur fractureafteratraumatic injury when a log rolled over his legs.  Rproximal tibia fractureand Ldistal femur fracture #Post-op  day 30from external fixationwith closed reductionof right tibial plateau fracture,4-compartment fasciotomy releaseof RLE, and open reduction internal fixation of left intra-articular distal femur fracture. #Post-op day39fromopen reduction internal fixation of right bicondylar tibial plateau fracture,removal of external fixator right leg,secondary closure of right lower extremity fasciotomy wounds  Recommendations to continue at SNF  -touchdown weightbearing to the left lower extremity -weightbearing as tolerated for transfers only on the right lower extremity - dressing changes as needed -bed to chair transfers for the next 4 to 6 weeks. - follow-up with ortho in 2 weeks  Acute blood loss anemia- stable Hgb8.8 (8.3 << 8.6 << 7.9<< 7.7). On admission12.7, with prior baseline13.9. Transfused 1 uniton11/8. Reticindex 0.6 indicating hyperproliferation. Ferritin 29. Likely slow recovery afterblood loss fromsurgery.  - continue to monitor - ordered oral iron supplementation  Chronic diastolic heart failure No sign of volume overload - home torsemide5mg  PO daily  Chronic a fibs/pAV nodal ablation and biventricular pacemaker Remote Hx of DVT/PE HisINRwas3.2 on admissionandreceivedIVVit K5mg preop.  - IPJ8.8today, last warfarin dose 5mg  on 11/10 - plts improved to baseline 155  - hold warfarin 11/12 and 11/13, follow-up INR at 11/14 at SNF facility    Diet:Heart healthy Fluids: None VTE SNK:[id number] enoxaparin 40mg  subQ daily for supra-therapeutic INR CODE STATUS:FULL CODE   Dispo: Discharge to SNF today. COVID testing negative 11/11.   Ladona Horns, MD 05/14/2019, 6:03 AM Pager: [phone]

## 2019-06-02 ENCOUNTER — Other Ambulatory Visit: Payer: Self-pay

## 2019-06-02 ENCOUNTER — Inpatient Hospital Stay (HOSPITAL_COMMUNITY)
Admission: EM | Admit: 2019-06-02 | Discharge: 2019-06-08 | DRG: 177 | Disposition: A | Discharge status: Skilled Nursing Facility | Payer: Medicare Other | Source: Other Acute Inpatient Hospital | Attending: Internal Medicine | Admitting: Internal Medicine

## 2019-06-02 ENCOUNTER — Encounter (HOSPITAL_COMMUNITY): Payer: Self-pay

## 2019-06-02 ENCOUNTER — Telehealth: Payer: Self-pay | Admitting: Cardiology

## 2019-06-02 DIAGNOSIS — E876 Hypokalemia: Secondary | ICD-10-CM | POA: Diagnosis not present

## 2019-06-02 DIAGNOSIS — J9621 Acute and chronic respiratory failure with hypoxia: Secondary | ICD-10-CM | POA: Diagnosis present

## 2019-06-02 DIAGNOSIS — I5032 Chronic diastolic (congestive) heart failure: Secondary | ICD-10-CM

## 2019-06-02 DIAGNOSIS — S82101D Unspecified fracture of upper end of right tibia, subsequent encounter for closed fracture with routine healing: Secondary | ICD-10-CM | POA: Diagnosis not present

## 2019-06-02 DIAGNOSIS — E785 Hyperlipidemia, unspecified: Secondary | ICD-10-CM | POA: Diagnosis present

## 2019-06-02 DIAGNOSIS — I482 Chronic atrial fibrillation, unspecified: Secondary | ICD-10-CM | POA: Diagnosis not present

## 2019-06-02 DIAGNOSIS — X58XXXD Exposure to other specified factors, subsequent encounter: Secondary | ICD-10-CM | POA: Diagnosis present

## 2019-06-02 DIAGNOSIS — J449 Chronic obstructive pulmonary disease, unspecified: Secondary | ICD-10-CM | POA: Diagnosis not present

## 2019-06-02 DIAGNOSIS — D62 Acute posthemorrhagic anemia: Secondary | ICD-10-CM | POA: Diagnosis present

## 2019-06-02 DIAGNOSIS — Z7901 Long term (current) use of anticoagulants: Secondary | ICD-10-CM

## 2019-06-02 DIAGNOSIS — J9601 Acute respiratory failure with hypoxia: Secondary | ICD-10-CM | POA: Diagnosis not present

## 2019-06-02 DIAGNOSIS — K219 Gastro-esophageal reflux disease without esophagitis: Secondary | ICD-10-CM | POA: Diagnosis present

## 2019-06-02 DIAGNOSIS — Z888 Allergy status to other drugs, medicaments and biological substances status: Secondary | ICD-10-CM

## 2019-06-02 DIAGNOSIS — R791 Abnormal coagulation profile: Secondary | ICD-10-CM | POA: Diagnosis present

## 2019-06-02 DIAGNOSIS — S82401D Unspecified fracture of shaft of right fibula, subsequent encounter for closed fracture with routine healing: Secondary | ICD-10-CM | POA: Diagnosis not present

## 2019-06-02 DIAGNOSIS — Z85118 Personal history of other malignant neoplasm of bronchus and lung: Secondary | ICD-10-CM | POA: Diagnosis not present

## 2019-06-02 DIAGNOSIS — Z87891 Personal history of nicotine dependence: Secondary | ICD-10-CM | POA: Diagnosis not present

## 2019-06-02 DIAGNOSIS — Z950 Presence of cardiac pacemaker: Secondary | ICD-10-CM

## 2019-06-02 DIAGNOSIS — Z79899 Other long term (current) drug therapy: Secondary | ICD-10-CM

## 2019-06-02 DIAGNOSIS — S72301D Unspecified fracture of shaft of right femur, subsequent encounter for closed fracture with routine healing: Secondary | ICD-10-CM

## 2019-06-02 DIAGNOSIS — I110 Hypertensive heart disease with heart failure: Secondary | ICD-10-CM

## 2019-06-02 DIAGNOSIS — Z9981 Dependence on supplemental oxygen: Secondary | ICD-10-CM

## 2019-06-02 DIAGNOSIS — J8410 Pulmonary fibrosis, unspecified: Secondary | ICD-10-CM | POA: Diagnosis not present

## 2019-06-02 DIAGNOSIS — N400 Enlarged prostate without lower urinary tract symptoms: Secondary | ICD-10-CM | POA: Diagnosis present

## 2019-06-02 DIAGNOSIS — J440 Chronic obstructive pulmonary disease with acute lower respiratory infection: Secondary | ICD-10-CM | POA: Diagnosis present

## 2019-06-02 DIAGNOSIS — Z79891 Long term (current) use of opiate analgesic: Secondary | ICD-10-CM

## 2019-06-02 DIAGNOSIS — Z09 Encounter for follow-up examination after completed treatment for conditions other than malignant neoplasm: Secondary | ICD-10-CM

## 2019-06-02 DIAGNOSIS — H9190 Unspecified hearing loss, unspecified ear: Secondary | ICD-10-CM | POA: Diagnosis present

## 2019-06-02 DIAGNOSIS — Z902 Acquired absence of lung [part of]: Secondary | ICD-10-CM | POA: Diagnosis not present

## 2019-06-02 DIAGNOSIS — E782 Mixed hyperlipidemia: Secondary | ICD-10-CM | POA: Diagnosis not present

## 2019-06-02 DIAGNOSIS — J1289 Other viral pneumonia: Secondary | ICD-10-CM | POA: Diagnosis present

## 2019-06-02 DIAGNOSIS — Z8249 Family history of ischemic heart disease and other diseases of the circulatory system: Secondary | ICD-10-CM | POA: Diagnosis not present

## 2019-06-02 DIAGNOSIS — Z886 Allergy status to analgesic agent status: Secondary | ICD-10-CM

## 2019-06-02 DIAGNOSIS — U071 COVID-19: Secondary | ICD-10-CM

## 2019-06-02 DIAGNOSIS — I5030 Unspecified diastolic (congestive) heart failure: Secondary | ICD-10-CM | POA: Diagnosis present

## 2019-06-02 HISTORY — DX: COVID-19: U07.1

## 2019-06-02 HISTORY — DX: Unspecified asthma, uncomplicated: J45.909

## 2019-06-02 HISTORY — DX: Heart failure, unspecified: I50.9

## 2019-06-02 HISTORY — DX: Dyspnea, unspecified: R06.00

## 2019-06-02 HISTORY — DX: Presence of cardiac pacemaker: Z95.0

## 2019-06-02 HISTORY — DX: Malignant (primary) neoplasm, unspecified: C80.1

## 2019-06-02 HISTORY — DX: Chronic obstructive pulmonary disease, unspecified: J44.9

## 2019-06-02 HISTORY — DX: Unspecified atrial fibrillation: I48.91

## 2019-06-02 LAB — CBC WITH DIFFERENTIAL/PLATELET
Abs Immature Granulocytes: 0.05 10*3/uL (ref 0.00–0.07)
Basophils Absolute: 0 10*3/uL (ref 0.0–0.1)
Basophils Relative: 0 %
Eosinophils Absolute: 0 10*3/uL (ref 0.0–0.5)
Eosinophils Relative: 0 %
HCT: 33.9 % — ABNORMAL LOW (ref 39.0–52.0)
Hemoglobin: 10.8 g/dL — ABNORMAL LOW (ref 13.0–17.0)
Immature Granulocytes: 1 %
Lymphocytes Relative: 4 %
Lymphs Abs: 0.3 10*3/uL — ABNORMAL LOW (ref 0.7–4.0)
MCH: 29.7 pg (ref 26.0–34.0)
MCHC: 31.9 g/dL (ref 30.0–36.0)
MCV: 93.1 fL (ref 80.0–100.0)
Monocytes Absolute: 0.4 10*3/uL (ref 0.1–1.0)
Monocytes Relative: 5 %
Neutro Abs: 7 10*3/uL (ref 1.7–7.7)
Neutrophils Relative %: 90 %
Platelets: 252 10*3/uL (ref 150–400)
RBC: 3.64 MIL/uL — ABNORMAL LOW (ref 4.22–5.81)
RDW: 16.6 % — ABNORMAL HIGH (ref 11.5–15.5)
WBC: 7.8 10*3/uL (ref 4.0–10.5)
nRBC: 0 % (ref 0.0–0.2)

## 2019-06-02 LAB — PROCALCITONIN: Procalcitonin: 0.7 ng/mL

## 2019-06-02 LAB — COMPREHENSIVE METABOLIC PANEL
ALT: 17 U/L (ref 0–44)
AST: 26 U/L (ref 15–41)
Albumin: 2.5 g/dL — ABNORMAL LOW (ref 3.5–5.0)
Alkaline Phosphatase: 97 U/L (ref 38–126)
Anion gap: 11 (ref 5–15)
BUN: 28 mg/dL — ABNORMAL HIGH (ref 8–23)
CO2: 29 mmol/L (ref 22–32)
Calcium: 8.9 mg/dL (ref 8.9–10.3)
Chloride: 99 mmol/L (ref 98–111)
Creatinine, Ser: 0.74 mg/dL (ref 0.61–1.24)
GFR calc Af Amer: >60 mL/min (ref >60)
GFR calc non Af Amer: >60 mL/min (ref >60)
Glucose, Bld: 107 mg/dL — ABNORMAL HIGH (ref 70–99)
Potassium: 3.7 mmol/L (ref 3.5–5.1)
Sodium: 139 mmol/L (ref 135–145)
Total Bilirubin: 0.9 mg/dL (ref 0.3–1.2)
Total Protein: 5.8 g/dL — ABNORMAL LOW (ref 6.5–8.1)

## 2019-06-02 LAB — MRSA PCR SCREENING: MRSA by PCR: POSITIVE — AB

## 2019-06-02 LAB — BRAIN NATRIURETIC PEPTIDE: B Natriuretic Peptide: 171.6 pg/mL — ABNORMAL HIGH (ref 0.0–100.0)

## 2019-06-02 LAB — PROTIME-INR
INR: 4.2 (ref 0.8–1.2)
Prothrombin Time: 40.4 seconds — ABNORMAL HIGH (ref 11.4–15.2)

## 2019-06-02 LAB — FERRITIN: Ferritin: 421 ng/mL — ABNORMAL HIGH (ref 24–336)

## 2019-06-02 LAB — LACTATE DEHYDROGENASE: LDH: 280 U/L — ABNORMAL HIGH (ref 98–192)

## 2019-06-02 LAB — D-DIMER, QUANTITATIVE: D-Dimer, Quant: 2.32 ug/mL-FEU — ABNORMAL HIGH (ref 0.00–0.50)

## 2019-06-02 LAB — C-REACTIVE PROTEIN: CRP: 23.5 mg/dL — ABNORMAL HIGH (ref <1.0)

## 2019-06-02 MED ORDER — GUAIFENESIN-DM 100-10 MG/5ML PO SYRP
10.0000 mL | ORAL_SOLUTION | Freq: Three times a day (TID) | ORAL | Status: DC
  Administered 2019-06-02 – 2019-06-08 (×17): 10 mL via ORAL
  Filled 2019-06-02 (×19): qty 10

## 2019-06-02 MED ORDER — ZINC SULFATE 220 (50 ZN) MG PO CAPS
220.0000 mg | ORAL_CAPSULE | Freq: Every day | ORAL | Status: DC
  Administered 2019-06-02 – 2019-06-08 (×7): 220 mg via ORAL
  Filled 2019-06-02 (×7): qty 1

## 2019-06-02 MED ORDER — ACETAMINOPHEN 325 MG PO TABS: 650.0000 mg | ORAL_TABLET | Freq: Four times a day (QID) | ORAL | Status: DC | PRN

## 2019-06-02 MED ORDER — VITAMIN C 500 MG PO TABS
500.0000 mg | ORAL_TABLET | Freq: Every day | ORAL | Status: DC
  Administered 2019-06-02 – 2019-06-08 (×7): 500 mg via ORAL
  Filled 2019-06-02 (×7): qty 1

## 2019-06-02 MED ORDER — GUAIFENESIN-DM 100-10 MG/5ML PO SYRP: 10.0000 mL | ORAL_SOLUTION | ORAL | Status: DC | PRN

## 2019-06-02 MED ORDER — PANTOPRAZOLE SODIUM 40 MG PO TBEC
40.0000 mg | DELAYED_RELEASE_TABLET | Freq: Every day | ORAL | Status: DC
  Administered 2019-06-03 – 2019-06-08 (×6): 40 mg via ORAL
  Filled 2019-06-02 (×6): qty 1

## 2019-06-02 MED ORDER — DEXAMETHASONE SODIUM PHOSPHATE 10 MG/ML IJ SOLN
6.0000 mg | Freq: Two times a day (BID) | INTRAMUSCULAR | Status: DC
  Administered 2019-06-02: 6 mg via INTRAVENOUS
  Filled 2019-06-02: qty 1

## 2019-06-02 MED ORDER — WARFARIN - PHARMACIST DOSING INPATIENT
Freq: Every day | Status: DC
  Administered 2019-06-02 – 2019-06-03 (×2)

## 2019-06-02 MED ORDER — DARIFENACIN HYDROBROMIDE ER 7.5 MG PO TB24
7.5000 mg | ORAL_TABLET | Freq: Every day | ORAL | Status: DC
  Administered 2019-06-03 – 2019-06-08 (×6): 7.5 mg via ORAL
  Filled 2019-06-02 (×8): qty 1

## 2019-06-02 MED ORDER — DEXAMETHASONE 6 MG PO TABS
6.0000 mg | ORAL_TABLET | Freq: Every day | ORAL | Status: DC
  Administered 2019-06-02: 6 mg via ORAL
  Filled 2019-06-02: qty 1

## 2019-06-02 MED ORDER — IPRATROPIUM-ALBUTEROL 20-100 MCG/ACT IN AERS
1.0000 | INHALATION_SPRAY | Freq: Four times a day (QID) | RESPIRATORY_TRACT | Status: DC
  Administered 2019-06-02 – 2019-06-03 (×6): 1 via RESPIRATORY_TRACT
  Filled 2019-06-02: qty 4

## 2019-06-02 MED ORDER — TORSEMIDE 10 MG PO TABS
10.0000 mg | ORAL_TABLET | Freq: Every day | ORAL | Status: DC
  Administered 2019-06-02 – 2019-06-08 (×7): 10 mg via ORAL
  Filled 2019-06-02 (×8): qty 1

## 2019-06-02 MED ORDER — BENZONATATE 100 MG PO CAPS
100.0000 mg | ORAL_CAPSULE | Freq: Two times a day (BID) | ORAL | Status: DC
  Administered 2019-06-02 – 2019-06-03 (×2): 100 mg via ORAL
  Filled 2019-06-02 (×2): qty 1

## 2019-06-02 MED ORDER — POLYSACCHARIDE IRON COMPLEX 150 MG PO CAPS
150.0000 mg | ORAL_CAPSULE | Freq: Every day | ORAL | Status: DC
  Administered 2019-06-03 – 2019-06-08 (×6): 150 mg via ORAL
  Filled 2019-06-02 (×8): qty 1

## 2019-06-02 MED ORDER — Medication
100.00 | Status: DC
Start: ? — End: 2019-06-02

## 2019-06-02 MED ORDER — HYDROCOD POLST-CPM POLST ER 10-8 MG/5ML PO SUER: 5.0000 mL | Freq: Two times a day (BID) | ORAL | Status: DC | PRN

## 2019-06-02 MED ORDER — SODIUM CHLORIDE 0.9% IV SOLUTION: Freq: Once | INTRAVENOUS | Status: DC

## 2019-06-02 MED ORDER — DOCUSATE SODIUM 100 MG PO CAPS
100.0000 mg | ORAL_CAPSULE | Freq: Every day | ORAL | Status: DC
  Administered 2019-06-02 – 2019-06-08 (×7): 100 mg via ORAL
  Filled 2019-06-02 (×7): qty 1

## 2019-06-02 MED ORDER — SODIUM CHLORIDE 0.9 % IV SOLN
100.0000 mg | Freq: Every day | INTRAVENOUS | Status: AC
  Administered 2019-06-02 – 2019-06-05 (×4): 100 mg via INTRAVENOUS
  Filled 2019-06-02: qty 20
  Filled 2019-06-02 (×3): qty 100

## 2019-06-02 MED ORDER — ENOXAPARIN SODIUM 40 MG/0.4ML ~~LOC~~ SOLN: 40.0000 mg | SUBCUTANEOUS | Status: DC

## 2019-06-02 NOTE — Telephone Encounter (Signed)
New Message    Pts wife is calling and says the pt was in an accident and broke both legs. While in Rehab he contracted COVID and is in the hospital currently.  The pts wife would like to be able to do his coumadin checks when he is at home because he will not be mobile.     Please call

## 2019-06-02 NOTE — Progress Notes (Signed)
All sutures removed from bilateral legs.  Pt tolerated well.

## 2019-06-02 NOTE — Plan of Care (Signed)
Care plan initiated and discussed with spouse via phone. Verbalizes understanding.

## 2019-06-02 NOTE — Telephone Encounter (Signed)
Patient will go home with Home Health? He sill NOT need his own machine if Home Health goes to see him 2-4 times per months (or more).  Patient's wife able to use POC machine?

## 2019-06-02 NOTE — Plan of Care (Addendum)
Patient stated that he does not want plasma. Wants to discuss more about it with MD in am. Dr. Vanita Ingles made aware of it.  Call back received from Dr. Vanita Ingles and to hold plasma till am when patient will discuss with MD.

## 2019-06-02 NOTE — Progress Notes (Signed)
PROGRESS NOTE  Brief Narrative: Jerome Newman is a 85 y.o. male with a history of lung CA s/p resection, HFpEF, AFib on coumadin, COPD on nocturnal 2L O2 among others who was sent to [place] ED by EMS from [facility]) where he was recovering post surgery 11/6 and 11/8 for fractures of the right femur and tibial plateau, and left femur with associated fasciotomies which occurred after a log rolled onto the leg. Due to worsening cough and shortness of breath he was tested and found to be positive for covid at the facility 11/22, confirmed in ED 11/30. Due to shortness of breath worsening starting 11/29, he was transferred to the ED 11/30. 101.32F, requiring 4L O2, found to have bilateral opacities on CXR in ED. Vancomycin, cefepime, and remdesivir were given and the patient was transferred to [facility] at family request. Pt very hard of hearing, limiting history, but was admitted this morning by Dr. Vanita Ingles. Records from facility and from ED were reviewed by me.  Subjective: Shortness of breath is stable this morning, denies any pain in his chest. Pain in legs is controlled. Wife states he was due for orthopedics follow up today for suture removal.  Reevaluated this PM and now back down to home 2L, feels comfortable. No shortness of breath.   Objective: BP 127/61 (BP Location: Right Arm)   Pulse 81   Temp 98.1 F (36.7 C) (Oral)   Resp 16   Ht 6' (1.829 m)   Wt 82.7 kg   SpO2 94%   BMI 24.73 kg/m   Gen: Elderly male in no acute distress, laying supine. Pulm: Nonlabored with supplemental oxygen, crackles bilaterally.   CV: RRR, no murmur, no JVD, no edema GI: Soft, NT, ND, +BS  Neuro: Alert and oriented. No focal deficits. Skin: Many surgical wounds on bilateral lower extremities all well healed without evidence of infection. Nylon sutures remain in situ.  ABG: 7.520 / 31.7 / 72.9 / 25.9 CXR: More diffuse interstitial opacities are now noted throughout both lungs with  superimposed groundglass and reticular opacities in the peripheries of both lungs.   Assessment & Plan: Acute on chronic hypoxic respiratory failure due to covid-19 pneumonia: At very high risk of decompensation given advanced age, history of many severe comorbidities including COPD on nocturnal home oxygen, lobectomy and severely elevated inflammatory markers.  - Continue remdesivir x5 days (11/30 - 12/4) - Discussed with wife by phone for ~30 minutes and with patient including typing for him to read and reviewing convalescent plasma consent. They both agree to proceed. He has signed the consent after all questions answered and we will order plasma now.  - CRP very elevated but patient showing positive clinical trajectory. With elevated PCT (0.70), we will defer off-label use of tocilizumab. - Has been on decadron 6mg  daily since 11/23 per records and CRP still >20. Will double steroid dosing. - Vitamin C, zinc. - Guarded prognosis. Confirmed desire to be treated in ICU if indicated, but clearly stated that he would not be a candidate for intubation.  - Trend labs - Enoxaparin prophylactic dose. - Maintain euvolemia/net negative.  - Avoid NSAIDs - Recommend proning and aggressive use of incentive spirometry. - Combivent q6h for COPD, no wheezing noted.  Recent trauma with multiple lower extremity fractures: s/p surgery 11/6 and 11/8 at [facility] by Dr. Doreatha Martin. - Remove sutures on bilateral legs (placed 05/10/2019). Wounds have healed, showing no evidence of dehiscence or infection - Follow up with orthopedics as outpatient.  Chronic HFpEF, HTN, HLD: Appears euvolemic. Clinical history would suggest volume depletion though creatinine is lower than previously and BNP is elevated.  - Will continue home torsemide - I/O, weights - Continue statin, LFTs wnl.   Chronic atrial fibrillation s/p AV nodal ablation 2010, s/p BiV PPM: Paced rhythm on admission ECG.   - Continue  anticoagulation  Supratherapeutic INR: Had INR 5.3 on 11/30 at facility.  - Hold lovenox bridge and coumadin while supratherapeutic. No evidence of bleeding, so not reversing coumadin. Continue monitoring INR.  Stage II NSCLC: hx RUL resection 2015  BPH:  - Continue formulary equivalent for solifenacin 5mg   - Monitor UOP.   GERD:  - Continue PPI  Surgical blood loss anemia: Improving from previous admission.  - Continue po iron   Hard of hearing: With dead batteries in hearing aids, this impacts patient care. - I've asked his wife to bring hearing aid battery and cell phone (can text) tomorrow morning.  I have confirmed medications in EMR with records sent from SNF.  Patrecia Pour, MD Pager on amion 06/02/2019, 9:31 AM

## 2019-06-02 NOTE — H&P (Signed)
TRH H&P   Patient Demographics:    Jerome Newman, is a 85 y.o. male  MRN: [id number]   DOB - [birth year]  Admit Date - 06/02/2019  Outpatient Primary MD for the patient is Slatosky, Marshall Cork., MD   Patient coming from: [place]  No chief complaint on file.    HPI:    Jerome Newman  is a 85 y.o. male with HFpEF, chronic A. fib on anticoagulation, COPD 2 L [place] O2, GERD, HTN, presents as a transfer from [place] where he had presented with increasing oxygen requirement found to be SARS-CoV-2 positive.  Patient was on [facility] service 11/6-11/06/2019 with right proximal tibia fracture, left distal femur fracture sustained when the log rolled over his legs.  He was discharged to rehabilitation center, was doing well until started having increasing oxygen requirement over the last couple of days.  Sent to the ER where he was found to be SARS-CoV-2 positive CXR with multifocal pneumonia, patient transferred to [facility] at family request.  He denies any chest pain, palpitations, orthopnea or PND.  Patient is difficult to understand with hoarse voice.   Review of systems:  Review of Systems:  Constitutional: negative for anorexia, chills, fatigue or fevers HEENT: negative for earaches, epistaxis, or sore throat Respiratory: see HPI Cardiovascular: negative for chest pain, palpitations, or syncope GU: negative for dysuria, urinary frequency, urinary urgency, hematuria Gastrointestinal: negative for abdominal pain, constipation, diarrhea, nausea or vomiting Musculoskeletal: negative for arthralgias, back pain or myalgias Neurological: negative for dizziness, headaches or weakness Behavioral/Psych: negative for suicidal or  homicidal ideation Skin:negative for rash Heme: negative for bruises Endo: negative for hair loss, weight gain/loss  With Past History of the following :   Past Medical History:  Diagnosis Date  . Asthma   . Atrial fibrillation ([place])   . BPH (benign prostatic hyperplasia)   . Cancer ([place])   . CHF (congestive heart failure) ([place])   . COPD (chronic obstructive pulmonary disease) ([place])   . Dyspnea   . GERD (gastroesophageal reflux disease)   . Pancreatitis   . Pneumonia   . Presence of permanent cardiac pacemaker      Past Surgical History:  Procedure Laterality Date  . AV NODE ABLATION  01/18/2016  . BI-VENTRICULAR PACEMAKER INSERTION (CRT-P)    . BLADDER SURGERY  Removed cancer at [facility]  . CARDIOVERSION    . DRESSING CHANGE UNDER ANESTHESIA Left 05/10/2019   Procedure: Dressing Change Under Anesthesia;  Surgeon: Shona Needles, MD;  Location: [place];  Service: Orthopedics;  Laterality: Left;  . EXTERNAL FIXATION LEG Bilateral 05/08/2019   Procedure: EXTERNAL FIXATION LEG;  Surgeon: Shona Needles, MD;  Location: [place];  Service: Orthopedics;  Laterality: Bilateral;  . FASCIOTOMY Right 05/08/2019   Procedure: FASCIOTOMY;  Surgeon: Shona Needles, MD;  Location: [place];  Service: Orthopedics;  Laterality: Right;  . FRACTURE SURGERY    . HERNIA REPAIR    . INSERT / REPLACE / REMOVE PACEMAKER    . LUNG REMOVAL, PARTIAL Right   . ORIF TIBIA PLATEAU Right 05/10/2019   Procedure: OPEN REDUCTION INTERNAL FIXATION (ORIF) TIBIAL PLATEAU RIGHT;  Surgeon: Shona Needles, MD;  Location: [place];  Service: Orthopedics;  Laterality: Right;  . SECONDARY CLOSURE OF WOUND Right 05/10/2019   Procedure: SECONDARY CLOSURE OF WOUND RIGHT LEG;  Surgeon: Shona Needles, MD;  Location: [place];  Service: Orthopedics;  Laterality: Right;    Social History:    Social History   Tobacco Use  . Smoking status: Former Research scientist (life sciences)  . Smokeless tobacco: Never Used  Substance Use Topics  . Alcohol use: Not  Currently    Family History  Problem Relation Age of Onset  . Heart disease Father   . Cancer Sister     Home Medications:   Prior to Admission medications   Medication Sig Start Date End Date Taking? Authorizing Provider  acetaminophen (TYLENOL) 500 MG tablet Take 1,000 mg by mouth 2 (two) times daily.     [provider]  Cholecalciferol (VITAMIN D3) 50 MCG (2000 UT) capsule Take 1 capsule (2,000 Units total) by mouth 2 (two) times daily. 05/11/19   Delray Alt, PA-C  HYDROcodone-acetaminophen (NORCO/VICODIN) 5-325 MG tablet Take 1 tablet by mouth every 6 (six) hours as needed for severe pain. 05/11/19   Delray Alt, PA-C  iron polysaccharides (NIFEREX) 150 MG capsule Take 1 capsule (150 mg total) by mouth daily. 05/15/19   Jean Rosenthal, MD  OXYGEN Inhale 2 L/min into the lungs at bedtime.    [provider]  pantoprazole (PROTONIX) 40 MG tablet Take 40 mg by mouth 2 (two) times daily. 12/27/16   [provider]  Polyethyl Glyc-Propyl Glyc PF (SYSTANE PRESERVATIVE FREE) 0.4-0.3 % SOLN Place 1-2 drops into both eyes 3 (three) times daily as needed (for dryness).    [provider]  polyethylene glycol (MIRALAX / GLYCOLAX) 17 g packet Take 17 g by mouth daily. 05/14/19   Ladona Horns, MD  solifenacin (VESICARE) 5 MG tablet Take 5 mg by mouth every other day.    [provider]  torsemide (DEMADEX) 10 MG tablet TAKE 1 TABLET BY MOUTH ONCE DAILY Patient taking differently: Take 10 mg by mouth daily.  10/31/18   Richardo Priest, MD  warfarin (COUMADIN) 2.5 MG tablet Take 1-1.5 tablets (2.5-3.75 mg total) by mouth See admin instructions. Take 3.125 mg by mouth at bedtime on Sun/Mon/Tues/Wed/Thurs/Sat and 2.5 mg on Fri  PLEASE HOLD WARFARIN ON 05/14/2019 AND 05/15/2019 due to INR of 3.8. Will need repeat INR 05/14/19   Jean Rosenthal, MD     Allergies:     Allergies  Allergen Reactions  . Gabapentin Other (See Comments)    Drowsiness   .  Nsaids Other (See Comments)    NOT  SUPPOSED TO TAKE DUE TO BLOOD THINNERS     Physical Exam:   Vitals  Blood pressure 125/69, pulse 81, temperature 97.7 F (36.5 C), temperature source Axillary, resp. rate 19, height 6' (1.829 m), weight 82.7 kg, SpO2 91 %.  Physical Exam   Constitutional - resting comfortably, no acute distress Eyes - pupils equal round and reactive to light and accomodation, extra ocular movements intact Nose - no gross deformity or drainage Mouth - no oral lesions noted Throat - no swelling or erythema Neck - supple, no JVD   CV - (+)S1S2, no murmurs  Resp -bilateral lower lung field rhonchi,  GI - (+)BS, soft, non-tender, non-distended Extrem - no clubbing, cyanosis, or peripheral edema  Skin - no rashes or wounds Neuro - alert, aware, oriented to person/place/time  Psych - normal affect, no anxiety   Patient has Pressure Ulcer on Admission?: no   Data Review:    CBC No results for input(s): WBC, HGB, HCT, PLT, MCV, MCH, MCHC, RDW, LYMPHSABS, MONOABS, EOSABS, BASOSABS, BANDABS in the last 168 hours.  Invalid input(s): NEUTRABS, BANDSABD ------------------------------------------------------------------------------------------------------------------  Chemistries  No results for input(s): NA, K, CL, CO2, GLUCOSE, BUN, CREATININE, CALCIUM, MG, AST, ALT, ALKPHOS, BILITOT in the last 168 hours.  Invalid input(s): GFRCGP ------------------------------------------------------------------------------------------------------------------ estimated creatinine clearance is 54.4 mL/min (by C-G formula based on SCr of 1.05 mg/dL). ------------------------------------------------------------------------------------------------------------------ No results for input(s): TSH, T4TOTAL, T3FREE, THYROIDAB in the last 72 hours.  Invalid input(s): FREET3  Coagulation profile No results for input(s): INR, PROTIME in the last 168 hours.  ------------------------------------------------------------------------------------------------------------------- No results for input(s): DDIMER in the last 72 hours. -------------------------------------------------------------------------------------------------------------------  Cardiac Enzymes No results for input(s): CKMB, TROPONINI, MYOGLOBIN in the last 168 hours.  Invalid input(s): CK ------------------------------------------------------------------------------------------------------------------    Component Value Date/Time   BNP CANCELED 01/01/2017 1551     ---------------------------------------------------------------------------------------------------------------  Urinalysis    Component Value Date/Time   COLORURINE YELLOW 08/30/2018 2000   APPEARANCEUR CLEAR 08/30/2018 2000   LABSPEC 1.021 08/30/2018 2000   PHURINE 6.0 08/30/2018 2000   GLUCOSEU NEGATIVE 08/30/2018 2000   HGBUR NEGATIVE 08/30/2018 2000   [place] NEGATIVE 08/30/2018 2000   KETONESUR 5 (A) 08/30/2018 2000   PROTEINUR 100 (A) 08/30/2018 2000   NITRITE NEGATIVE 08/30/2018 2000   LEUKOCYTESUR NEGATIVE 08/30/2018 2000    ----------------------------------------------------------------------------------------------------------------   Imaging Results:    No results found.   Assessment & Plan:    Principal Problem:   Acute hypoxemic respiratory failure due to severe acute respiratory syndrome coronavirus 2 (SARS-CoV-2) disease (HCC) Active Problems:   Diastolic congestive heart failure (HCC)   Hyperlipidemia   Lung fibrosis (HCC)   COPD, mild (HCC)   Chronic atrial fibrillation (HCC)    Acute hypoxemic respiratory failure due to SARS-CoV-2 disease/COPD, mild/Lung fibrosis: Patient with chronic hypoxic respiratory failure on 2 L [place] O2, sent to the ED with increasing oxygen requirement.  Covid positive. Date of Dx: 06/01/2019 Oxygen requirements: 4 LPM Antibiotics: N/A Diuretics:  N/A Vitamin C and Zinc: Per protocol Remdesivir: Started on *06/01/2019 Steroids: Started on 40/04/2724     Diastolic congestive heart failure: Clinically euvolemic.  Sodium diet.  Fluid restriction.  Resume torsemide when verified by pharmacy.    Hyperlipidemia: Cardiac diet.      Chronic atrial fibrillation: Rate is controlled.  On warfarin, pharmacy to dose  DVT Prophylaxis warfarin  AM Labs Ordered, also please review Full Orders  Family Communication: Admission, patients condition and plan of care including tests being ordered have been discussed with  the patient who indicate understanding and agree with the plan and Code Status.  Code Status full code  Likely DC to rehabilitation  Admission status: Admit to inpatient  Time spent in minutes :52   Peyton Bottoms M.D on 06/02/2019 at 6:02 AM  To page go to [email or web address] - password [facility]

## 2019-06-02 NOTE — Progress Notes (Signed)
Wife called and update given.  She states that his follow-up appointment with ortho was to be today and was to get sutures removed today.  Will notify Dr. Bonner Puna about this and see what plan is regarding this.

## 2019-06-02 NOTE — Telephone Encounter (Signed)
Spoke with patient's wife, [name], per DPR who reports that our coumadin clinic typically manages patient's coumadin. His last coumadin check was on 04/21/2019. He has been in rehab in [place], [place] and was transferred to the [facility] last night with COVID. [name] is trying to prepare for when he comes home and wants to be set up for home PT/INR checks as patient's mobility will be very limited. Please call [name] to discuss options. Thanks!

## 2019-06-02 NOTE — Plan of Care (Addendum)
Patient received to room 111 by [facility]. Bedside Report receive from EMS team. Patient on 4L/[place] and sats  92 %. Admission assessment completed. Dr. Vanita Ingles notified for orders.  [place] ED called for Report and received.  0220 Spouse called for history info due to patient [facility] and unable to answer some of the questions. Update given to spouse. Nasal swab for MRSA screen completed and sent to lab.

## 2019-06-02 NOTE — Progress Notes (Signed)
CRITICAL VALUE ALERT  Critical Value: INR 4.2  Date & Time Notied:  12/1 07:54  Provider Notified: Dr. Bonner Puna  Orders Received/Actions taken: awaiting orders

## 2019-06-02 NOTE — Progress Notes (Signed)
[place] for Warfarin Indication: atrial fibrillation  Allergies  Allergen Reactions  . Gabapentin Other (See Comments)    Drowsiness   . Nsaids Other (See Comments)    NOT SUPPOSED TO TAKE DUE TO BLOOD THINNERS    Patient Measurements: Height: 6' (182.9 cm) Weight: 182 lb 5.1 oz (82.7 kg) IBW/kg (Calculated) : 77.6  Vital Signs: Temp: 98.1 F (36.7 C) (12/01 0845) Temp Source: Oral (12/01 0845) BP: 127/61 (12/01 0845) Pulse Rate: 81 (12/01 0845)  Labs: Recent Labs    06/02/19 0535  HGB 10.8*  HCT 33.9*  PLT 252  LABPROT 40.4*  INR 4.2*  CREATININE 0.74    Estimated Creatinine Clearance: 71.4 mL/min (by C-G formula based on SCr of 0.74 mg/dL).   Medical History: Past Medical History:  Diagnosis Date  . Asthma   . Atrial fibrillation ([place])   . BPH (benign prostatic hyperplasia)   . Cancer ([place])   . CHF (congestive heart failure) ([place])   . COPD (chronic obstructive pulmonary disease) ([place])   . Dyspnea   . GERD (gastroesophageal reflux disease)   . Pancreatitis   . Pneumonia   . Presence of permanent cardiac pacemaker     Medications:  Waiting on med history- it appears per dispensing history that patient takes 2.5 mg tablets with dosing of 1 1/4 tablet (3.125 mg) daily except 1 tablet (2.5 mg) on Friday  Assessment: 85 y/o M from SNF where he was recovering post-surgery admitted with respiratory failure due to SARS-CoV-2 disease. Pateint has a h/o CAF on warfarin.   -INR is above goal today at 4.2 -No significant drug interactions at present  Goal of Therapy:  INR 2-3   Plan:  -D/C Lovenox  -Hold warfarin for now -Daily INR  Ulice Dash D 06/02/2019,10:49 AM

## 2019-06-03 LAB — CBC WITH DIFFERENTIAL/PLATELET
Abs Immature Granulocytes: 0.08 10*3/uL — ABNORMAL HIGH (ref 0.00–0.07)
Basophils Absolute: 0 10*3/uL (ref 0.0–0.1)
Basophils Relative: 0 %
Eosinophils Absolute: 0 10*3/uL (ref 0.0–0.5)
Eosinophils Relative: 0 %
HCT: 35.5 % — ABNORMAL LOW (ref 39.0–52.0)
Hemoglobin: 11.3 g/dL — ABNORMAL LOW (ref 13.0–17.0)
Immature Granulocytes: 1 %
Lymphocytes Relative: 3 %
Lymphs Abs: 0.3 10*3/uL — ABNORMAL LOW (ref 0.7–4.0)
MCH: 29.9 pg (ref 26.0–34.0)
MCHC: 31.8 g/dL (ref 30.0–36.0)
MCV: 93.9 fL (ref 80.0–100.0)
Monocytes Absolute: 0.2 10*3/uL (ref 0.1–1.0)
Monocytes Relative: 3 %
Neutro Abs: 7.7 10*3/uL (ref 1.7–7.7)
Neutrophils Relative %: 93 %
Platelets: 243 10*3/uL (ref 150–400)
RBC: 3.78 MIL/uL — ABNORMAL LOW (ref 4.22–5.81)
RDW: 16.8 % — ABNORMAL HIGH (ref 11.5–15.5)
WBC: 8.3 10*3/uL (ref 4.0–10.5)
nRBC: 0 % (ref 0.0–0.2)

## 2019-06-03 LAB — PROTIME-INR
INR: 3.2 — ABNORMAL HIGH (ref 0.8–1.2)
Prothrombin Time: 32.8 seconds — ABNORMAL HIGH (ref 11.4–15.2)

## 2019-06-03 LAB — FERRITIN: Ferritin: 360 ng/mL — ABNORMAL HIGH (ref 24–336)

## 2019-06-03 LAB — COMPREHENSIVE METABOLIC PANEL
ALT: 22 U/L (ref 0–44)
AST: 32 U/L (ref 15–41)
Albumin: 2.7 g/dL — ABNORMAL LOW (ref 3.5–5.0)
Alkaline Phosphatase: 99 U/L (ref 38–126)
Anion gap: 10 (ref 5–15)
BUN: 33 mg/dL — ABNORMAL HIGH (ref 8–23)
CO2: 30 mmol/L (ref 22–32)
Calcium: 9.1 mg/dL (ref 8.9–10.3)
Chloride: 98 mmol/L (ref 98–111)
Creatinine, Ser: 0.84 mg/dL (ref 0.61–1.24)
GFR calc Af Amer: >60 mL/min (ref >60)
GFR calc non Af Amer: >60 mL/min (ref >60)
Glucose, Bld: 105 mg/dL — ABNORMAL HIGH (ref 70–99)
Potassium: 4.1 mmol/L (ref 3.5–5.1)
Sodium: 138 mmol/L (ref 135–145)
Total Bilirubin: 1.1 mg/dL (ref 0.3–1.2)
Total Protein: 6.3 g/dL — ABNORMAL LOW (ref 6.5–8.1)

## 2019-06-03 LAB — C-REACTIVE PROTEIN: CRP: 20.1 mg/dL — ABNORMAL HIGH (ref <1.0)

## 2019-06-03 LAB — ABO/RH: ABO/RH(D): O POS

## 2019-06-03 LAB — D-DIMER, QUANTITATIVE: D-Dimer, Quant: 1.83 ug/mL-FEU — ABNORMAL HIGH (ref 0.00–0.50)

## 2019-06-03 MED ORDER — VITAMIN D3 25 MCG (1000 UT) PO CAPS: 2000.0000 [IU] | ORAL_CAPSULE | Freq: Two times a day (BID) | ORAL | Status: DC

## 2019-06-03 MED ORDER — IPRATROPIUM-ALBUTEROL 20-100 MCG/ACT IN AERS
1.0000 | INHALATION_SPRAY | Freq: Four times a day (QID) | RESPIRATORY_TRACT | Status: DC | PRN
  Administered 2019-06-06: 1 via RESPIRATORY_TRACT
  Filled 2019-06-03: qty 4

## 2019-06-03 MED ORDER — ZINC SULFATE 220 (50 ZN) MG PO CAPS: 220.0000 mg | ORAL_CAPSULE | Freq: Every day | ORAL | Status: DC

## 2019-06-03 MED ORDER — VITAMIN C 500 MG PO TABS: 1000.0000 mg | ORAL_TABLET | Freq: Every day | ORAL | Status: DC

## 2019-06-03 MED ORDER — DEXAMETHASONE SODIUM PHOSPHATE 10 MG/ML IJ SOLN
6.0000 mg | INTRAMUSCULAR | Status: AC
  Administered 2019-06-03 – 2019-06-04 (×2): 6 mg via INTRAVENOUS
  Filled 2019-06-03 (×2): qty 1

## 2019-06-03 MED ORDER — POLYVINYL ALCOHOL 1.4 % OP SOLN
1.0000 [drp] | Freq: Four times a day (QID) | OPHTHALMIC | Status: DC | PRN
  Filled 2019-06-03: qty 15

## 2019-06-03 MED ORDER — POLYETHYL GLYC-PROPYL GLYC PF 0.4-0.3 % OP SOLN: 2.0000 [drp] | Freq: Four times a day (QID) | OPHTHALMIC | Status: DC | PRN

## 2019-06-03 MED ORDER — WARFARIN SODIUM 1 MG PO TABS
1.0000 mg | ORAL_TABLET | Freq: Once | ORAL | Status: AC
  Administered 2019-06-03: 1 mg via ORAL
  Filled 2019-06-03: qty 1

## 2019-06-03 MED ORDER — VITAMIN D 25 MCG (1000 UNIT) PO TABS
2000.0000 [IU] | ORAL_TABLET | Freq: Two times a day (BID) | ORAL | Status: DC
  Administered 2019-06-03 – 2019-06-08 (×11): 2000 [IU] via ORAL
  Filled 2019-06-03 (×11): qty 2

## 2019-06-03 MED ORDER — BENZONATATE 100 MG PO CAPS: 100.0000 mg | ORAL_CAPSULE | Freq: Three times a day (TID) | ORAL | Status: DC | PRN

## 2019-06-03 NOTE — Progress Notes (Signed)
Jerome Newman  ZDG:[id number] DOB: Oct 22, 1929 DOA: 06/02/2019 PCP: [name]., MD    Brief Narrative:  85 year old with a history of lung cancer status post resection, chronic atrial fibrillation, COPD requiring 2 L nasal cannula O2 support, GERD, and HTN who presented as a transfer from [place] where he presented with worsening hypoxic respiratory failure due to Covid.  He was previously admitted 11/6 > 11/12 with a right proximal tibia and left distal femur fracture following a traumatic accident.  He was able to be discharged to a rehab center where he was progressing until he began to develop shortness of breath.  In the ED a CXR noted multifocal pulmonary infiltrates.  Significant Events: 11/6-11/12 traumatic lower extremity fractures requiring surgical intervention/hospitalization 11/22 Covid positive test at rehab facility 11/30 sent to [place] ED with worsening shortness of breath 12/1 transfer to [facility] at family request  COVID-19 specific Treatment: Remdesivir 11/30 > Decadron 11/30 > Convalescent plasma - refused   Subjective: Saturations presently stable on 2 L nasal cannula.  Appears to be resting comfortably in bed but is confused.  Does not remember discussing convalescent plasma and is currently refusing it.  Denies chest pain nausea or vomiting.  Tells me he wants to get out of here soon as possible.  Assessment & Plan:  Covid pneumonia -acute on chronic hypoxic respiratory failure Continue remdesivir and Decadron -refused convalescent plasma -clinically appears to be improving -inflammatory markers trending in a favorable direction -titrate oxygen as able  Recent Labs  Lab 06/02/19 0535 06/03/19 0210  DDIMER 2.32* 1.83*  FERRITIN 421* 360*  CRP 23.5* 20.1*  ALT 17 22  PROCALCITON 0.70  --     COPD with chronic hypoxic respiratory failure No active wheezing at present  Recent trauma with multiple lower extremity fractures  status post surgery 11/6 and 11/8 Dr. 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 rate 16, height 6' (1.829 m), weight 84 kg, SpO2 96 %.  Intake/Output Summary (Last 24 hours) at 06/03/2019 0851 Last data filed at 06/03/2019 0839 Gross per 24 hour  Intake 550 ml  Output 2125 ml  Net -1575 ml   Filed Weights   06/02/19 0428 06/03/19 0500  Weight: 82.7 kg 84 kg    Examination: General: No acute respiratory distress Lungs: Fine bibasilar crackles with no wheezing Cardiovascular: Regular rate and rhythm without murmur gallop or rub normal S1 and S2 Abdomen: Nontender, nondistended, soft, bowel sounds positive, no rebound, no ascites, no appreciable mass Extremities: No significant cyanosis, clubbing, or edema bilateral lower extremities  CBC: Recent Labs  Lab 06/02/19 0535 06/03/19 0210  WBC 7.8 8.3  NEUTROABS 7.0 7.7  HGB 10.8* 11.3*  HCT 33.9* 35.5*  MCV 93.1 93.9  PLT 252 243  Basic Metabolic Panel: Recent Labs  Lab 06/02/19 0535 06/03/19 0210  NA 139 138  K 3.7 4.1  CL 99 98  CO2 29 30  GLUCOSE 107* 105*  BUN 28* 33*  CREATININE 0.74 0.84   CALCIUM 8.9 9.1   GFR: Estimated Creatinine Clearance: 68 mL/min (by C-G formula based on SCr of 0.84 mg/dL).  Liver Function Tests: Recent Labs  Lab 06/02/19 0535 06/03/19 0210  AST 26 32  ALT 17 22  ALKPHOS 97 99  BILITOT 0.9 1.1  PROT 5.8* 6.3*  ALBUMIN 2.5* 2.7*    Coagulation Profile: Recent Labs  Lab 06/02/19 0535 06/03/19 0210  INR 4.2* 3.2*    Recent Results (from the past 240 hour(s))  MRSA PCR Screening     Status: Abnormal   Collection Time: 06/02/19  4:57 AM   Specimen: Nasopharyngeal Wash  Result Value Ref Range Status   MRSA by PCR POSITIVE (A) NEGATIVE Final    Comment:        The GeneXpert MRSA Assay (FDA approved for NASAL specimens only), is one component of a comprehensive MRSA colonization surveillance program. It is not intended to diagnose MRSA infection nor to guide or monitor treatment for MRSA infections. Performed at [facility], [place] [address], [place], [place] [address]      Scheduled Meds: . sodium chloride   Intravenous Once  . benzonatate  100 mg Oral BID  . darifenacin  7.5 mg Oral Daily  . dexamethasone (DECADRON) injection  6 mg Intravenous Q12H  . docusate sodium  100 mg Oral Daily  . guaiFENesin-dextromethorphan  10 mL Oral TID  . Ipratropium-Albuterol  1 puff Inhalation Q6H  . iron polysaccharides  150 mg Oral Daily  . pantoprazole  40 mg Oral Daily  . torsemide  10 mg Oral Daily  . vitamin C  500 mg Oral Daily  . Warfarin - Pharmacist Dosing Inpatient   Does not apply q1800  . zinc sulfate  220 mg Oral Daily   Continuous Infusions: . remdesivir 100 mg in NS 250 mL 100 mg (06/02/19 1646)     LOS: 1 day   Cherene Altes, MD [facility] Office  [phone] Pager - Text Page per Shea Evans  If 7PM-7AM, please contact night-coverage per Amion 06/03/2019, 8:51 AM

## 2019-06-03 NOTE — Progress Notes (Signed)
[place] for Warfarin Indication: atrial fibrillation  Allergies  Allergen Reactions  . Gabapentin Other (See Comments)    Drowsiness   . Nsaids Other (See Comments)    NOT SUPPOSED TO TAKE DUE TO BLOOD THINNERS    Patient Measurements: Height: 6' (182.9 cm) Weight: 185 lb 3 oz (84 kg) IBW/kg (Calculated) : 77.6  Vital Signs: Temp: 97.2 F (36.2 C) (12/02 0837) Temp Source: Oral (12/02 0837) BP: 103/57 (12/02 0837) Pulse Rate: 80 (12/02 0837)  Labs: Recent Labs    06/02/19 0535 06/03/19 0210  HGB 10.8* 11.3*  HCT 33.9* 35.5*  PLT 252 243  LABPROT 40.4* 32.8*  INR 4.2* 3.2*  CREATININE 0.74 0.84    Estimated Creatinine Clearance: 68 mL/min (by C-G formula based on SCr of 0.84 mg/dL).   Medical History: Past Medical History:  Diagnosis Date  . Asthma   . Atrial fibrillation ([place])   . BPH (benign prostatic hyperplasia)   . Cancer ([place])   . CHF (congestive heart failure) ([place])   . COPD (chronic obstructive pulmonary disease) ([place])   . Dyspnea   . GERD (gastroesophageal reflux disease)   . Pancreatitis   . Pneumonia   . Presence of permanent cardiac pacemaker     Medications:  Per med history patient does NOT take 2.5 mg tablets with dosing of 1 1/4 tablet (3.125 mg) daily except 1 tablet (2.5 mg) on Friday, but is instead on 2mg  daily with dose on 11/29 held per MD (no reason given).   Assessment: 85 y/o M from SNF where he was recovering post-surgery admitted with respiratory failure due to SARS-CoV-2 disease. Jerome Newman has a h/o CAF on warfarin.   -INR is above goal today at 4.2>>3.2 -Will give smaller dose today to prevent dropping below goal.  -No significant drug interactions at present  Goal of Therapy:  INR 2-3   Plan:   -Give warfarin 1mg  x 1 today to prevent drop below goal -Daily INR  Akeyla Molden A. Levada Dy, PharmD, BCPS, FNKF Clinical Pharmacist [place] Please utilize Amion for appropriate phone  number to reach the unit pharmacist ([place])   06/03/2019,10:30 AM

## 2019-06-03 NOTE — Telephone Encounter (Signed)
Patient's wife, [name], is capable of operating an INR machine at home. The discharge plan is still up in the air as he was just admitted to the [facility] on 06/01/2019. Please follow up with patient/patient's wife at a later date to touch base and formulate a plan. Thanks!

## 2019-06-03 NOTE — Progress Notes (Signed)
Pt wife dropped of pt cell phone, phone charger, 6 pack hearing aid batteries

## 2019-06-04 LAB — COMPREHENSIVE METABOLIC PANEL
ALT: 46 U/L — ABNORMAL HIGH (ref 0–44)
AST: 57 U/L — ABNORMAL HIGH (ref 15–41)
Albumin: 2.7 g/dL — ABNORMAL LOW (ref 3.5–5.0)
Alkaline Phosphatase: 102 U/L (ref 38–126)
Anion gap: 11 (ref 5–15)
BUN: 36 mg/dL — ABNORMAL HIGH (ref 8–23)
CO2: 28 mmol/L (ref 22–32)
Calcium: 9 mg/dL (ref 8.9–10.3)
Chloride: 101 mmol/L (ref 98–111)
Creatinine, Ser: 0.88 mg/dL (ref 0.61–1.24)
GFR calc Af Amer: >60 mL/min (ref >60)
GFR calc non Af Amer: >60 mL/min (ref >60)
Glucose, Bld: 114 mg/dL — ABNORMAL HIGH (ref 70–99)
Potassium: 3.4 mmol/L — ABNORMAL LOW (ref 3.5–5.1)
Sodium: 140 mmol/L (ref 135–145)
Total Bilirubin: 1.3 mg/dL — ABNORMAL HIGH (ref 0.3–1.2)
Total Protein: 6 g/dL — ABNORMAL LOW (ref 6.5–8.1)

## 2019-06-04 LAB — CBC WITH DIFFERENTIAL/PLATELET
Abs Immature Granulocytes: 0.12 10*3/uL — ABNORMAL HIGH (ref 0.00–0.07)
Basophils Absolute: 0 10*3/uL (ref 0.0–0.1)
Basophils Relative: 1 %
Eosinophils Absolute: 0 10*3/uL (ref 0.0–0.5)
Eosinophils Relative: 0 %
HCT: 36.4 % — ABNORMAL LOW (ref 39.0–52.0)
Hemoglobin: 11.4 g/dL — ABNORMAL LOW (ref 13.0–17.0)
Immature Granulocytes: 1 %
Lymphocytes Relative: 3 %
Lymphs Abs: 0.3 10*3/uL — ABNORMAL LOW (ref 0.7–4.0)
MCH: 29.2 pg (ref 26.0–34.0)
MCHC: 31.3 g/dL (ref 30.0–36.0)
MCV: 93.1 fL (ref 80.0–100.0)
Monocytes Absolute: 0.4 10*3/uL (ref 0.1–1.0)
Monocytes Relative: 4 %
Neutro Abs: 7.8 10*3/uL — ABNORMAL HIGH (ref 1.7–7.7)
Neutrophils Relative %: 91 %
Platelets: 280 10*3/uL (ref 150–400)
RBC: 3.91 MIL/uL — ABNORMAL LOW (ref 4.22–5.81)
RDW: 16.5 % — ABNORMAL HIGH (ref 11.5–15.5)
WBC: 8.6 10*3/uL (ref 4.0–10.5)
nRBC: 0 % (ref 0.0–0.2)

## 2019-06-04 LAB — PROTIME-INR
INR: 3.3 — ABNORMAL HIGH (ref 0.8–1.2)
Prothrombin Time: 33.7 seconds — ABNORMAL HIGH (ref 11.4–15.2)

## 2019-06-04 LAB — C-REACTIVE PROTEIN: CRP: 10.9 mg/dL — ABNORMAL HIGH (ref <1.0)

## 2019-06-04 LAB — D-DIMER, QUANTITATIVE: D-Dimer, Quant: 1.91 ug/mL-FEU — ABNORMAL HIGH (ref 0.00–0.50)

## 2019-06-04 LAB — FERRITIN: Ferritin: 390 ng/mL — ABNORMAL HIGH (ref 24–336)

## 2019-06-04 MED ORDER — DEXAMETHASONE 6 MG PO TABS
6.0000 mg | ORAL_TABLET | Freq: Every day | ORAL | Status: DC
  Administered 2019-06-05 – 2019-06-08 (×4): 6 mg via ORAL
  Filled 2019-06-04 (×4): qty 1

## 2019-06-04 NOTE — Progress Notes (Signed)
Occupational Therapy Evaluation Patient Details Name: Jerome Newman MRN: [id number] DOB: 03/05/1930 Today's Date: 06/04/2019    History of Present Illness 85 year old with a history of lung cancer status post resection, chronic atrial fibrillation, COPD requiring 2 L nasal cannula O2 support, GERD, and HTN who presented as a transfer from [facility] where he presented with worsening hypoxic respiratory failure due to Covid.  He was previously admitted 11/6 > 11/12 with a right proximal tibia and left distal femur fracture following a traumatic accident, S/P fasciotomy with closure.Marland Kitchen  He was able to be discharged to a rehab center where he was progressing until he began to develop shortness of breath.  In the ED a CXR noted multifocal pulmonary infiltrates.   Clinical Impression   PTA pt was receiving rehab services at SNF due to previous injury. Prior to injury pt was independent with ADLs and mobility. Pt currently requires variable min assist to total assist for self-care and functional transfer tasks. Pt tolerated sitting EOB 10+ min, noting 0 instances of LOB. Pt is extremely HOH requiring increased time and cues to complete tasks. Pt stood x 1 with RW maintaining TTWB on left and reporting increased dizziness. BP noted to be dropping (see numbers below). Pt's O2 SATs decreased to 85% with activity on RA, increasing to 93% following seated rest break. 3/4 DOE. Pt demonstrates decreased strength, endurance, balance, standing tolerance, and activity tolerance impacting ability to complete self-care and functional transfer tasks. Recommend skilled OT services to address above deficits in order to promote function and prevent further decline. Recommend SNF placement for additional rehab prior to discharge home.  Blood pressure sitting: 112/69mmHg Blood pressure sitting ~10 min: 101/33mmHg Blood pressure after standing: 106/34mmHg. Blood pressure supine after activity: 106/25mmHg    Follow  Up Recommendations  SNF    Equipment Recommendations  Other (comment)(TDB at next venue of care. Likely will require shower chair )    Recommendations for Other Services       Precautions / Restrictions Precautions Precautions: Fall Restrictions Weight Bearing Restrictions: Yes RLE Weight Bearing: Weight bearing as tolerated(for transfers only) LLE Weight Bearing: Touchdown weight bearing      Mobility Bed Mobility Overal bed mobility: Needs Assistance Bed Mobility: Supine to Sit;Sit to Supine     Supine to sit: Min assist Sit to supine: Min assist   General bed mobility comments: Assist with BLEs.   Transfers Overall transfer level: Needs assistance Equipment used: Rolling walker (2 wheeled) Transfers: Sit to/from Stand Sit to Stand: Max assist;+2 physical assistance;+2 safety/equipment;From elevated surface         General transfer comment: Pt stood x 1, unable to transfer to bedside chair due to increasing dizziness with BP dropping.    Balance Overall balance assessment: Needs assistance Sitting-balance support: Feet supported;Bilateral upper extremity supported Sitting balance-Leahy Scale: Fair   Postural control: Posterior lean Standing balance support: Bilateral upper extremity supported;During functional activity Standing balance-Leahy Scale: Poor Standing balance comment: Pt was able to stand with RW with bil UE support and external support for a few seconds but fatigues quickly.                           ADL either performed or assessed with clinical judgement   ADL Overall ADL's : Needs assistance/impaired Eating/Feeding: Sitting;Minimal assistance   Grooming: Minimal assistance;Sitting   Upper Body Bathing: Minimal assistance;Sitting   Lower Body Bathing: Sitting/lateral leans;Sit to/from stand;Total assistance   Upper  Body Dressing : Minimal assistance;Sitting   Lower Body Dressing: Sitting/lateral leans;Sit to/from stand;Total  assistance   Toilet Transfer: +2 for physical assistance;+2 for safety/equipment;BSC;Maximal assistance   Toileting- Clothing Manipulation and Hygiene: Maximal assistance;Sit to/from stand;Sitting/lateral lean       Functional mobility during ADLs: +2 for physical assistance;+2 for safety/equipment;Maximal assistance       Vision Baseline Vision/History: Wears glasses Wears Glasses: At all times       Perception     Praxis      Pertinent Vitals/Pain Pain Assessment: Faces Faces Pain Scale: Hurts even more Pain Location: left LE Pain Descriptors / Indicators: Aching;Grimacing;Guarding Pain Intervention(s): Limited activity within patient's tolerance;Monitored during session;Repositioned     Hand Dominance Right   Extremity/Trunk Assessment Upper Extremity Assessment Upper Extremity Assessment: Generalized weakness   Lower Extremity Assessment Lower Extremity Assessment: Defer to PT evaluation RLE Deficits / Details: tolerates hip flexion to 90, knee flexion to 80, active dorsiflexion present, grossly 4/5 bears weight in standing LLE Deficits / Details: hip flexion to 90 sitting, knee flexion 70, dorsiflexion present, able to lift leg from bed   Cervical / Trunk Assessment Cervical / Trunk Assessment: Kyphotic   Communication Communication Communication: HOH(Pt only has 1 hearing aide in left ear.)   Cognition Arousal/Alertness: Awake/alert Behavior During Therapy: Restless;Anxious Overall Cognitive Status: No family/caregiver present to determine baseline cognitive functioning Area of Impairment: Orientation                 Orientation Level: Time;Situation             General Comments: Pt is extremely HOH, impacting ability to follow instructions. Pt is very hoarse when he speaks, difficult to hear.   General Comments  Pt tolerated sitting EOB 10+ min. Pt stood x 1 this date, however reported increased dizziness with BP dropping. Pt assisted back to  bed. RN updated concerning orthostatic hypotension and WB status.     Exercises     Shoulder Instructions      Home Living Family/patient expects to be discharged to:: Other (Comment)(Wife deciding between SNF and [facility]) Living Arrangements: Spouse/significant other Available Help at Discharge: Family;Available 24 hours/day Type of Home: House Home Access: Level entry     Home Layout: One level     Bathroom Shower/Tub: Other (comment)(roll in shower)   Bathroom Toilet: Standard     Home Equipment: Cane - single point   Additional Comments: PLOF obtained from wife via phone.      Prior Functioning/Environment Level of Independence: Independent with assistive device(s);Needs assistance  Gait / Transfers Assistance Needed: Occasionally used cane for mobility prior to injury. ADL's / Homemaking Assistance Needed: Prior to injury, pt independent with ADLs and transfers. Wife completes all IADLs. Pt was also driving. Communication / Swallowing Assistance Needed: hoarse and voice is barely audible Comments: Wife reports that home is w/c accessible. Per wife, pt has had 0 falls in the last 6 months. Pt uses 2L O2 at night.        OT Problem List:        OT Treatment/Interventions: Self-care/ADL training;Therapeutic exercise;Neuromuscular education;Energy conservation;DME and/or AE instruction;Therapeutic activities;Patient/family education;Balance training    OT Goals(Current goals can be found in the care plan section) Acute Rehab OT Goals Patient Stated Goal: agreed to sit up Time For Goal Achievement: 06/18/19 Potential to Achieve Goals: Good ADL Goals Pt Will Perform Grooming: with set-up;sitting Pt Will Perform Lower Body Dressing: with mod assist;with adaptive equipment;sitting/lateral leans;sit to/from stand Pt Will  Transfer to Toilet: with mod assist;bedside commode Pt Will Perform Toileting - Clothing Manipulation and hygiene: with mod assist;sitting/lateral leans;sit  to/from stand Additional ADL Goal #1: Pt to recall and demonstrate breathing exercises with 0 verbal cues on technique.  OT Frequency: Min 2X/week   Barriers to D/C:            Co-evaluation PT/OT/SLP Co-Evaluation/Treatment: Yes Reason for Co-Treatment: For patient/therapist safety PT goals addressed during session: Mobility/safety with mobility OT goals addressed during session: ADL's and self-care      AM-PAC OT "6 Clicks" Daily Activity     Outcome Measure Help from another person eating meals?: A Little Help from another person taking care of personal grooming?: A Little Help from another person toileting, which includes using toliet, bedpan, or urinal?: A Lot Help from another person bathing (including washing, rinsing, drying)?: Total Help from another person to put on and taking off regular upper body clothing?: A Little Help from another person to put on and taking off regular lower body clothing?: Total 6 Click Score: 13   End of Session Equipment Utilized During Treatment: Oxygen;Rolling walker Nurse Communication: Mobility status  Activity Tolerance: Patient limited by fatigue(Limited by SOB, pain, dizziness, and anxiety) Patient left: in bed;with call bell/phone within reach;with bed alarm set  OT Visit Diagnosis: Unsteadiness on feet (R26.81);Muscle weakness (generalized) (M62.81)                Time: 1771-1657 OT Time Calculation (min): 32 min Charges:  OT General Charges $OT Visit: 1 Visit OT Evaluation $OT Eval Moderate Complexity: 1 [address] OTR/L [phone]   Mauri Brooklyn 06/04/2019, 1:48 PM

## 2019-06-04 NOTE — Evaluation (Signed)
Physical Therapy Evaluation Patient Details Name: Jerome Newman MRN: [id number] DOB: 04-26-1930 Today's Date: 06/04/2019   History of Present Illness  85 year old with a history of lung cancer status post resection, chronic atrial fibrillation, COPD requiring 2 L nasal cannula O2 support, GERD, and HTN who presented as a transfer from [place] where he presented with worsening hypoxic respiratory failure due to Covid.  He was previously admitted 11/6 > 11/12 with a right proximal tibia and left distal femur fracture following a traumatic accident, S/P fasciotomy with closure.Marland Kitchen  He was able to be discharged to a rehab center where he was progressing until he began to develop shortness of breath.  In the ED a CXR noted multifocal pulmonary infiltrates.  Clinical Impression  The patient is very HOH and difficult to understand due to voice is very hoarse. The patient did  Participate in sitting at the bed edge without assistance. Patient stood x 1 at [facility] ans was able to maintain TDWB on Left Leg. The patient indicated feeling dizzy.  Orthostatic Vitals: Pt Position BP HR  Supine 126/88 Remained in 80's through out  Sitting 112/80 then 101/80   Standing Taken supine after standing 106/89.   Standing 3 min     SPo2 on RA lowest 85% up to 93% at rest.  Pt admitted with above diagnosis. Pt currently with functional limitations due to the deficits listed below (see PT Problem List). Pt will benefit from skilled PT to increase their independence and safety with mobility to allow discharge to the venue listed below.       Follow Up Recommendations SNF;Supervision/Assistance - 24 hour    Equipment Recommendations  None recommended by PT    Recommendations for Other Services       Precautions / Restrictions Precautions Precautions: Fall Restrictions RLE Weight Bearing: Weight bearing as tolerated LLE Weight Bearing: Touchdown weight bearing      Mobility  Bed Mobility   Bed  Mobility: Supine to Sit     Supine to sit: Min assist     General bed mobility comments: pt. indicated to place [facility] flat, with much effort patient able to get legs over bed edge and push self up to sitting. Assisted with legs back into bed due to patient indicating that he needs to lie down and started to do so.  Transfers   Equipment used: Rolling walker (2 wheeled) Transfers: Sit to/from Stand Sit to Stand: Max assist;+2 physical assistance;+2 safety/equipment;From elevated surface         General transfer comment: Patient stood x 30 secs at RW, able to [employer]. Did not tansfer as pt. indicated dizziness. BP slowly dropping Diastolic.  Ambulation/Gait                Stairs            Wheelchair Mobility    Modified Rankin (Stroke Patients Only)       Balance Overall balance assessment: Needs assistance Sitting-balance support: Feet supported;Bilateral upper extremity supported Sitting balance-Leahy Scale: Fair   Postural control: Posterior lean Standing balance support: Bilateral upper extremity supported;During functional activity Standing balance-Leahy Scale: Poor Standing balance comment: Pt was able to stand with RW with bil UE support and external support for a few seconds but fatigues quickly.                             Pertinent Vitals/Pain Faces Pain Scale: Hurts even more Pain  Location: left LE Pain Descriptors / Indicators: Aching;Grimacing;Guarding Pain Intervention(s): Monitored during session;Repositioned;Limited activity within patient's tolerance    Home Living Family/patient expects to be discharged to:: Skilled nursing facility Living Arrangements: Spouse/significant other Available Help at Discharge: Family;Available 24 hours/day Type of Home: House Home Access: Level entry     Home Layout: One level Home Equipment: Walker - 2 wheels;Wheelchair - manual;Bedside commode;Hand held shower head;Cane - single  point Additional Comments: info from previous encounter    Prior Function Level of Independence: Independent with assistive device(s);Needs assistance   Gait / Transfers Assistance Needed: used cane with Modif I prior to leg trauma  ADL's / Homemaking Assistance Needed: Independent with bathing and dressing        Hand Dominance   Dominant Hand: Right    Extremity/Trunk Assessment        Lower Extremity Assessment Lower Extremity Assessment: RLE deficits/detail;LLE deficits/detail RLE Deficits / Details: tolerates hip flexion to 90, knee flexion to 80, active dorsiflexion present, grossly 4/5 bears weight in standing LLE Deficits / Details: hip flexion to 90 sitting, knee flexion 70, dorsiflexion present, able to lift leg from bed    Cervical / Trunk Assessment Cervical / Trunk Assessment: Kyphotic  Communication   Communication: HOH(L ear HA,)  Cognition   Behavior During Therapy: Restless;Anxious Overall Cognitive Status: No family/caregiver present to determine baseline cognitive functioning Area of Impairment: Orientation                 Orientation Level: Time;Situation             General Comments: patient follows commands and attempts answers when able to hear, speak which is barely audible, very hoarse      General Comments      Exercises     Assessment/Plan    PT Assessment Patient needs continued PT services  PT Problem List Decreased activity tolerance;Decreased balance;Decreased mobility;Decreased strength;Decreased range of motion;Decreased knowledge of use of DME;Decreased safety awareness;Decreased knowledge of precautions;Pain       PT Treatment Interventions Therapeutic activities;Therapeutic exercise;Patient/family education;Balance training;Functional mobility training    PT Goals (Current goals can be found in the Care Plan section)  Acute Rehab PT Goals Patient Stated Goal: agreed to sit up PT Goal Formulation: With  patient Time For Goal Achievement: 06/18/19 Potential to Achieve Goals: Fair    Frequency Min 2X/week   Barriers to discharge        Co-evaluation PT/OT/SLP Co-Evaluation/Treatment: Yes Reason for Co-Treatment: For patient/therapist safety PT goals addressed during session: Mobility/safety with mobility OT goals addressed during session: ADL's and self-care       AM-PAC PT "6 Clicks" Mobility  Outcome Measure Help needed turning from your back to your side while in a flat bed without using bedrails?: A Lot Help needed moving from lying on your back to sitting on the side of a flat bed without using bedrails?: A Lot Help needed moving to and from a bed to a chair (including a wheelchair)?: Total Help needed standing up from a chair using your arms (e.g., wheelchair or bedside chair)?: Total Help needed to walk in hospital room?: Total Help needed climbing 3-5 steps with a railing? : Total 6 Click Score: 8    End of Session   Activity Tolerance: Patient limited by fatigue;Patient limited by pain Patient left: in bed;with call bell/phone within reach;with bed alarm set Nurse Communication: Mobility status PT Visit Diagnosis: Muscle weakness (generalized) (M62.81);Pain Pain - Right/Left: Left Pain - part of body:  Leg    Time: 5300-5110 PT Time Calculation (min) (ACUTE ONLY): 32 min   Charges:   PT Evaluation $PT Eval Moderate Complexity: 1 [place]  Office [phone]   Claretha Cooper 06/04/2019, 12:43 PM

## 2019-06-04 NOTE — Progress Notes (Signed)
Jerome Newman  LNL:[id number] DOB: May 19, 1930 DOA: 06/02/2019 PCP: [name]., MD    Brief Narrative:  85 year old with a history of lung cancer status post resection, chronic atrial fibrillation, COPD requiring 2 L nasal cannula O2 support, GERD, and HTN who presented as a transfer from [place] where he presented with worsening hypoxic respiratory failure due to Covid.  He was previously admitted 11/6 > 11/12 with a right proximal tibia and left distal femur fracture following a traumatic accident.  He was able to be discharged to a rehab center where he was progressing until he began to develop shortness of breath.  In the ED a CXR noted multifocal pulmonary infiltrates.  Significant Events: 11/6-11/12 traumatic lower extremity fractures requiring surgical intervention/hospitalization 11/22 Covid positive test at rehab facility 11/30 sent to [place] ED with worsening shortness of breath 12/1 transfer to [facility] at family request  COVID-19 specific Treatment: Remdesivir 11/30 > 12/4 Decadron 11/30 > Convalescent plasma - refused   Subjective: Communication is quite difficult as the patient is very hoarse and also extremely hard of hearing.  He is able to indicate to me that he is very much motivated and ambulating and working with physical therapy.  He denies shortness of breath or chest pain.  He is very interested in going home.  Assessment & Plan:  Covid pneumonia - acute on chronic hypoxic respiratory failure Continue remdesivir and Decadron -refused convalescent plasma -clinically appears to be improving -inflammatory markers trending in a favorable direction -titrate oxygen as able  Recent Labs  Lab 06/02/19 0535 06/03/19 0210 06/04/19 0205  DDIMER 2.32* 1.83* 1.91*  FERRITIN 421* 360* 390*  CRP 23.5* 20.1* 10.9*  ALT 17 22 46*  PROCALCITON 0.70  --   --     COPD with chronic hypoxic respiratory failure No active wheezing   Recent  trauma with multiple lower extremity fractures status post surgery 11/6 and 11/8 Dr. Laurell Josephs -sutures removed 12/1 -wounds appear completely stable/healed -continue PT/OT  Chronic diastolic CHF No gross volume overload on physical exam -net negative approximately 2200 cc since admission  Mild hypokalemia Replace and follow -check magnesium  Mild transaminitis Likely due to Covid and remdesivir -not yet prohibitive to further remdesivir dosing -recheck in a.m.  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 rate 18, height 6' (1.829 m), weight 80.7 kg, SpO2 98 %.  Intake/Output Summary (Last 24 hours) at 06/04/2019 0907 Last data filed at 06/04/2019 0503 Gross per 24 hour  Intake 730 ml  Output 1300 ml  Net -570 ml   Filed Weights   06/03/19 0500 06/03/19 1211 06/04/19 0456  Weight: 84 kg 80.4 kg 80.7 kg    Examination: General: No acute respiratory distress Lungs: Fine bibasilar crackles without change Cardiovascular: RRR without murmur or rub Abdomen: NT/ND, soft, no rebound Extremities: No  edema bilateral lower extremities  CBC: Recent Labs  Lab 06/02/19 0535 06/03/19 0210 06/04/19 0205  WBC 7.8 8.3 8.6  NEUTROABS 7.0 7.7 7.8*  HGB 10.8* 11.3* 11.4*  HCT 33.9* 35.5* 36.4*  MCV 93.1 93.9  93.1  PLT 252 243 224   Basic Metabolic Panel: Recent Labs  Lab 06/02/19 0535 06/03/19 0210 06/04/19 0205  NA 139 138 140  K 3.7 4.1 3.4*  CL 99 98 101  CO2 29 30 28   GLUCOSE 107* 105* 114*  BUN 28* 33* 36*  CREATININE 0.74 0.84 0.88  CALCIUM 8.9 9.1 9.0   GFR: Estimated Creatinine Clearance: 64.9 mL/min (by C-G formula based on SCr of 0.88 mg/dL).  Liver Function Tests: Recent Labs  Lab 06/02/19 0535 06/03/19 0210 06/04/19 0205  AST 26 32 57*  ALT 17 22 46*  ALKPHOS 97 99 102  BILITOT 0.9 1.1 1.3*  PROT 5.8* 6.3* 6.0*  ALBUMIN 2.5* 2.7* 2.7*    Coagulation Profile: Recent Labs  Lab 06/02/19 0535 06/03/19 0210 06/04/19 0205  INR 4.2* 3.2* 3.3*    Recent Results (from the past 240 hour(s))  MRSA PCR Screening     Status: Abnormal   Collection Time: 06/02/19  4:57 AM   Specimen: Nasopharyngeal Wash  Result Value Ref Range Status   MRSA by PCR POSITIVE (A) NEGATIVE Final    Comment:        The GeneXpert MRSA Assay (FDA approved for NASAL specimens only), is one component of a comprehensive MRSA colonization surveillance program. It is not intended to diagnose MRSA infection nor to guide or monitor treatment for MRSA infections. Performed at [facility], [place] [address], [place], [place] [address]      Scheduled Meds: . cholecalciferol  2,000 Units Oral Q12H  . darifenacin  7.5 mg Oral Daily  . dexamethasone (DECADRON) injection  6 mg Intravenous Q24H  . docusate sodium  100 mg Oral Daily  . guaiFENesin-dextromethorphan  10 mL Oral TID  . iron polysaccharides  150 mg Oral Daily  . pantoprazole  40 mg Oral Daily  . torsemide  10 mg Oral Daily  . vitamin C  500 mg Oral Daily  . Warfarin - Pharmacist Dosing Inpatient   Does not apply q1800  . zinc sulfate  220 mg Oral Daily   Continuous Infusions: . remdesivir 100 mg in NS 250 mL Stopped (06/03/19 1136)     LOS: 2 days   Cherene Altes, MD [facility] Office   [phone] Pager - Text Page per Amion  If 7PM-7AM, please contact night-coverage per Amion 06/04/2019, 9:07 AM

## 2019-06-04 NOTE — Telephone Encounter (Signed)
I am not the only pharmacist here. I will forward the message to the anticoagulation group so we can follow up.

## 2019-06-04 NOTE — Telephone Encounter (Signed)
Patient still in the hospital without discharge plans.  Wife will call once discharge date and plans are know. If patient is to go home, we will apply for POC INR machine to be delivered ASAP.

## 2019-06-04 NOTE — Progress Notes (Signed)
[facility] for Warfarin Indication: atrial fibrillation  Allergies  Allergen Reactions  . Gabapentin Other (See Comments)    Drowsiness   . Nsaids Other (See Comments)    NOT SUPPOSED TO TAKE DUE TO BLOOD THINNERS    Patient Measurements: Height: 6' (182.9 cm) Weight: 177 lb 14.6 oz (80.7 kg) IBW/kg (Calculated) : 77.6  Vital Signs: Temp: 97.5 F (36.4 C) (12/03 0456) Temp Source: Oral (12/03 0456) BP: 126/88 (12/03 0456)  Labs: Recent Labs    06/02/19 0535 06/03/19 0210 06/04/19 0205  HGB 10.8* 11.3* 11.4*  HCT 33.9* 35.5* 36.4*  PLT 252 243 280  LABPROT 40.4* 32.8* 33.7*  INR 4.2* 3.2* 3.3*  CREATININE 0.74 0.84 0.88    Estimated Creatinine Clearance: 64.9 mL/min (by C-G formula based on SCr of 0.88 mg/dL).  Medications:  Per med history patient does NOT take 2.5 mg tablets with dosing of 1 1/4 tablet (3.125 mg) daily except 1 tablet (2.5 mg) on Friday, but is instead on 2mg  daily with dose on 11/29 held per MD (no reason given).   Assessment: 85 y/o M from SNF where he was recovering post-surgery admitted with respiratory failure due to SARS-CoV-2 disease. Pateint has a h/o CAF on warfarin.   -INR is above goal today at 4.2>>3.2>>3.3   Goal of Therapy:  INR 2-3   Plan:   -Hold warfarin today  -Daily INR  Thank you Anette Guarneri, PharmD  06/04/2019,8:49 AM

## 2019-06-05 LAB — CBC WITH DIFFERENTIAL/PLATELET
Abs Immature Granulocytes: 0.19 10*3/uL — ABNORMAL HIGH (ref 0.00–0.07)
Basophils Absolute: 0 10*3/uL (ref 0.0–0.1)
Basophils Relative: 0 %
Eosinophils Absolute: 0 10*3/uL (ref 0.0–0.5)
Eosinophils Relative: 0 %
HCT: 36.9 % — ABNORMAL LOW (ref 39.0–52.0)
Hemoglobin: 12 g/dL — ABNORMAL LOW (ref 13.0–17.0)
Immature Granulocytes: 2 %
Lymphocytes Relative: 6 %
Lymphs Abs: 0.6 10*3/uL — ABNORMAL LOW (ref 0.7–4.0)
MCH: 29.7 pg (ref 26.0–34.0)
MCHC: 32.5 g/dL (ref 30.0–36.0)
MCV: 91.3 fL (ref 80.0–100.0)
Monocytes Absolute: 0.7 10*3/uL (ref 0.1–1.0)
Monocytes Relative: 7 %
Neutro Abs: 8.9 10*3/uL — ABNORMAL HIGH (ref 1.7–7.7)
Neutrophils Relative %: 85 %
Platelets: 275 10*3/uL (ref 150–400)
RBC: 4.04 MIL/uL — ABNORMAL LOW (ref 4.22–5.81)
RDW: 16.5 % — ABNORMAL HIGH (ref 11.5–15.5)
WBC: 10.4 10*3/uL (ref 4.0–10.5)
nRBC: 0 % (ref 0.0–0.2)

## 2019-06-05 LAB — COMPREHENSIVE METABOLIC PANEL
ALT: 101 U/L — ABNORMAL HIGH (ref 0–44)
AST: 83 U/L — ABNORMAL HIGH (ref 15–41)
Albumin: 2.6 g/dL — ABNORMAL LOW (ref 3.5–5.0)
Alkaline Phosphatase: 105 U/L (ref 38–126)
Anion gap: 13 (ref 5–15)
BUN: 36 mg/dL — ABNORMAL HIGH (ref 8–23)
CO2: 26 mmol/L (ref 22–32)
Calcium: 8.8 mg/dL — ABNORMAL LOW (ref 8.9–10.3)
Chloride: 99 mmol/L (ref 98–111)
Creatinine, Ser: 0.93 mg/dL (ref 0.61–1.24)
GFR calc Af Amer: >60 mL/min (ref >60)
GFR calc non Af Amer: >60 mL/min (ref >60)
Glucose, Bld: 97 mg/dL (ref 70–99)
Potassium: 3.4 mmol/L — ABNORMAL LOW (ref 3.5–5.1)
Sodium: 138 mmol/L (ref 135–145)
Total Bilirubin: 1.3 mg/dL — ABNORMAL HIGH (ref 0.3–1.2)
Total Protein: 5.9 g/dL — ABNORMAL LOW (ref 6.5–8.1)

## 2019-06-05 LAB — MAGNESIUM: Magnesium: 1.9 mg/dL (ref 1.7–2.4)

## 2019-06-05 LAB — D-DIMER, QUANTITATIVE: D-Dimer, Quant: 2.17 ug/mL-FEU — ABNORMAL HIGH (ref 0.00–0.50)

## 2019-06-05 LAB — PROTIME-INR
INR: 4.1 (ref 0.8–1.2)
Prothrombin Time: 39.5 seconds — ABNORMAL HIGH (ref 11.4–15.2)

## 2019-06-05 LAB — FERRITIN: Ferritin: 235 ng/mL (ref 24–336)

## 2019-06-05 LAB — C-REACTIVE PROTEIN: CRP: 6.1 mg/dL — ABNORMAL HIGH (ref <1.0)

## 2019-06-05 MED ORDER — POTASSIUM CHLORIDE CRYS ER 20 MEQ PO TBCR
40.0000 meq | EXTENDED_RELEASE_TABLET | Freq: Every day | ORAL | Status: DC
  Administered 2019-06-05 – 2019-06-07 (×3): 40 meq via ORAL
  Filled 2019-06-05 (×3): qty 2

## 2019-06-05 NOTE — Plan of Care (Signed)
  Problem: Education: Goal: Knowledge of risk factors and measures for prevention of condition will improve Outcome: Progressing   Problem: Coping: Goal: Psychosocial and spiritual needs will be supported Outcome: Progressing   Problem: Respiratory: Goal: Will maintain a patent airway Outcome: Progressing   

## 2019-06-05 NOTE — TOC Initial Note (Addendum)
Transition of Care [facility]) - Initial/Assessment Note    Patient Details  Name: Jerome Newman MRN: [id number] Date of Birth: 1929-12-25  [facility] of [facility]) CM/SW Contact:    Geralynn Ochs, LCSW Phone Number: 06/05/2019, 3:49 PM  Clinical Narrative:   CSW spoke with patient's wife, [name], to discuss recommendation for SNF placement. Ann in agreement to SNF, but does not want him to return to previous SNF environment. CSW discussed with [name] where she would like him to go, and explained limited options available now that patient is COVID+. [name] would like patient to go to [facility] in [place], if possible. CSW provided [name] with list of other COVID SNF options. CSW contacted [facility] admissions to see if they would take COVID+ admissions, awaiting response. CSW completed referral and faxed to [facility]. CSW to follow.           UPDATE 4:25 PM: CSW received call back from [facility] [place] that they are unable to take new admissions at this time, only their current residents. CSW contacted patient's wife, [name], to update her. After reviewing other options, [name] would like to see if patient can return to [facility]. CSW called [facility] and left a voicemail in the admissions office, awaiting call back.       Expected Discharge Plan: Skilled Nursing Facility Barriers to Discharge: Continued Medical Work up   Patient Goals and CMS Choice Patient states their goals for this hospitalization and ongoing recovery are:: get up on his own CMS Medicare.gov Compare Post Acute Care list provided to:: Patient Represenative (must comment) Choice offered to / list presented to : Spouse  Expected Discharge Plan and Services Expected Discharge Plan: [place] Choice: [place] Living arrangements for the past 2 months: Single Family Home, [place]                                       Prior Living Arrangements/Services Living arrangements for the past 2 months: [place], [place] Lives with:: Spouse Patient language and need for interpreter reviewed:: No Do you feel safe going back to the place where you live?: Yes      Need for Family Participation in Patient Care: Yes (Comment) Care giver support system in place?: No (comment)   Criminal Activity/Legal Involvement Pertinent to Current Situation/Hospitalization: No - Comment as needed  Activities of Daily Living Home Assistive Devices/Equipment: Cane (specify quad or straight), Hearing aid ADL Screening (condition at time of admission) Patient's cognitive ability adequate to safely complete daily activities?: Yes Is the patient deaf or have difficulty hearing?: Yes Does the patient have difficulty seeing, even when wearing glasses/contacts?: No Does the patient have difficulty concentrating, remembering, or making decisions?: No Patient able to express need for assistance with ADLs?: Yes Does the patient have difficulty dressing or bathing?: Yes Independently performs ADLs?: No Communication: Independent Dressing (OT): Needs assistance Is this a change from baseline?: Pre-admission baseline Grooming: Needs assistance Is this a change from baseline?: Pre-admission baseline Feeding: Independent Bathing: Needs assistance Is this a change from baseline?: Pre-admission baseline Toileting: Needs assistance Is this a change from baseline?: Pre-admission baseline In/Out Bed: Needs assistance Is this a change from baseline?: Pre-admission baseline Walks in Home: Needs assistance Is this a change from baseline?: Pre-admission baseline Does the patient have difficulty  walking or climbing stairs?: Yes Weakness of Legs: Both Weakness of Arms/Hands: None  Permission Sought/Granted Permission sought to share information with : Facility Sport and exercise psychologist, Family Supports Permission  granted to share information with : Yes, Verbal Permission Granted  Share Information with NAME: [name]  Permission granted to share info w AGENCY: SNF  Permission granted to share info w Relationship: Wife     Emotional Assessment   Attitude/Demeanor/Rapport: Unable to Assess Affect (typically observed): Unable to Assess Orientation: : Oriented to Self, Oriented to Place, Oriented to  Time Alcohol / Substance Use: Not Applicable Psych Involvement: No (comment)  Admission diagnosis:  [place] Patient Active Problem List   Diagnosis Date Noted  . Acute hypoxemic respiratory failure due to severe acute respiratory syndrome coronavirus 2 (SARS-CoV-2) disease ([place]) 06/02/2019  . Traumatic compartment syndrome of right lower extremity ([place]) 05/10/2019  . Closed displaced supracondylar fracture of distal end of left femur with intracondylar extension ([place]) 05/10/2019  . Closed bicondylar fracture of proximal end of right tibia 05/08/2019  . Acute pancreatitis 08/30/2018  . Encounter for therapeutic drug monitoring 01/03/2017  . Diastolic congestive heart failure ([place]) 12/31/2016  . Hyperlipidemia 12/31/2016  . OSA (obstructive sleep apnea) 12/31/2016  . Oxygen dependent 12/31/2016  . Chronic anticoagulation 12/31/2016  . Chronic atrial fibrillation ([place]) 12/31/2016  . Lung fibrosis ([place]) 06/01/2016  . Status post biventricular pacemaker 01/18/2016  . S/P AV nodal ablation 01/18/2016  . CAD (coronary artery disease) 09/28/2015  . COPD, mild ([place]) 09/28/2015  . Malignant neoplasm of upper lobe of left lung ([place]) 02/28/2015  . DVT (deep venous thrombosis) ([place]) 09/26/2013  . Pulmonary embolism ([place]) 08/10/2013   PCP:  [name]., MD Pharmacy:  No Pharmacies Listed    Social Determinants of Health (SDOH) Interventions    Readmission Risk Interventions No flowsheet data found.

## 2019-06-05 NOTE — Progress Notes (Signed)
Physical Therapy Treatment Patient Details Name: Jerome Newman MRN: [id number] DOB: 09-15-1929 Today's Date: 06/05/2019    History of Present Illness 85 year old with a history of lung cancer status post resection, chronic atrial fibrillation, COPD requiring 2 L nasal cannula O2 support, GERD, and HTN who presented as a transfer from [facility] where he presented with worsening hypoxic respiratory failure due to Covid.  He was previously admitted 11/6 > 11/12 with a right proximal tibia and left distal femur fracture following a traumatic accident, S/P fasciotomy with closure.Marland Kitchen  He was able to be discharged to a rehab center where he was progressing until he began to develop shortness of breath.  In the ED a CXR noted multifocal pulmonary infiltrates.Dx with COVID PNA, acute on chronic hypoic respiratory failure.     PT Comments    RN requested PT assist in pt getting up OOB to the recliner chair.  Pt was able to do so mostly through gestures (as he is very [facility]), DOE 2/4 with O2 sats down to 86 with 2 L O2 [place] and pedi finger probe.  He rebounded quickly with seated rest and I got him a dry erase communication board so he can better understand staff.  PT will continue to follow acutely for safe mobility progression.   Follow Up Recommendations  SNF;Supervision/Assistance - 24 hour     Equipment Recommendations  Wheelchair (measurements PT);Wheelchair cushion (measurements PT);Rolling walker with 5" wheels;3in1 (PT)    Recommendations for Other Services   NA     Precautions / Restrictions Precautions Precautions: Fall Restrictions RLE Weight Bearing: Weight bearing as tolerated LLE Weight Bearing: Touchdown weight bearing    Mobility  Bed Mobility Overal bed mobility: Needs Assistance Bed Mobility: Supine to Sit     Supine to sit: Min assist     General bed mobility comments: Min assist mostly at his trunk to come to fully upright sitting.  He was able to get both of  his legs over the side of the bed and come half way to sitting using the bedrail before I had to assist.   Transfers Overall transfer level: Needs assistance Equipment used: Rolling walker (2 wheeled) Transfers: Sit to/from Stand;Lateral/Scoot Transfers Sit to Stand: (attempted, unsuccessful with one person)        Lateral/Scoot Transfers: Min assist;From elevated surface General transfer comment: Attempted to stand with one person assist and the RW, however, pt was not strong enough.  He was able to scoot across the drop arm recliner chair to the recliner with min assist recliner on his right side.  RN made aware of this technique and that I gave him a Data processing manager.          Balance Overall balance assessment: Needs assistance Sitting-balance support: Feet supported;Bilateral upper extremity supported Sitting balance-Leahy Scale: Fair     Standing balance support: Bilateral upper extremity supported Standing balance-Leahy Scale: Zero Standing balance comment: I was unable to get him standing, but attempted with RW and one person assist.                             Cognition Arousal/Alertness: Awake/alert Behavior During Therapy: WFL for tasks assessed/performed Overall Cognitive Status: Within Functional Limits for tasks assessed                                 General Comments: Pt is limited  by his very impaired hearing.  I gave him a dry erase communication board to help improve his ability to understand me and staff.       Exercises General Exercises - Lower Extremity Ankle Circles/Pumps: AROM;Both;10 reps Quad Sets: AROM;Both;10 reps Long Arc Quad: AROM;Right;10 reps;AAROM;Left;5 reps Heel Slides: AAROM;Right;10 reps Hip ABduction/ADduction: AAROM;Left;10 reps        Pertinent Vitals/Pain Pain Assessment: Faces Faces Pain Scale: Hurts even more Pain Location: left LE Pain Descriptors / Indicators: Guarding;Grimacing Pain  Intervention(s): Limited activity within patient's tolerance;Monitored during session;Repositioned           PT Goals (current goals can now befound in the care plan section) Acute Rehab PT Goals Patient Stated Goal: to get going with rehab, get out of the bed.  Progress towards PT goals: Progressing toward goals    Frequency    Min 2X/week      PT Plan Current plan remains appropriate       AM-PAC PT "6 Clicks" Mobility   Outcome Measure  Help needed turning from your back to your side while in a flat bed without using bedrails?: A Little Help needed moving from lying on your back to sitting on the side of a flat bed without using bedrails?: A Little Help needed moving to and from a bed to a chair (including a wheelchair)?: A Little Help needed standing up from a chair using your arms (e.g., wheelchair or bedside chair)?: Total Help needed to walk in hospital room?: Total Help needed climbing 3-5 steps with a railing? : Total 6 Click Score: 12    End of Session Equipment Utilized During Treatment: Gait belt Activity Tolerance: Patient limited by fatigue;Patient limited by pain Patient left: in chair;with call bell/phone within reach;with chair alarm set Nurse Communication: Mobility status;Other (comment)(communication board) PT Visit Diagnosis: Muscle weakness (generalized) (M62.81);Pain Pain - Right/Left: Left Pain - part of body: Leg     Time: 4034-7425 PT Time Calculation (min) (ACUTE ONLY): 38 min  Charges:  $Therapeutic Exercise: 8-22 mins $Therapeutic Activity: 38-52 mins                    Verdene Lennert, PT, DPT  Acute Rehabilitation [phone] pager #[phone] office  @ [place] [place]: [phone]   06/05/2019, 5:50 PM

## 2019-06-05 NOTE — Progress Notes (Signed)
Jerome Newman  NWG:[id number] DOB: 1929/10/03 DOA: 06/02/2019 PCP: [name]., MD    Brief Narrative:  85 year old with a history of lung cancer status post resection, chronic atrial fibrillation, COPD requiring 2 L nasal cannula O2 support, GERD, and HTN who presented as a transfer from [place] where he presented with worsening hypoxic respiratory failure due to Covid.  He was previously admitted 11/6 > 11/12 with a right proximal tibia and left distal femur fracture following a traumatic accident.  He was able to be discharged to a rehab center where he was progressing until he began to develop shortness of breath.  In the ED a CXR noted multifocal pulmonary infiltrates.  Significant Events: 11/6-11/12 traumatic lower extremity fractures requiring surgical intervention/hospitalization 11/22 Covid positive test at rehab facility 11/30 sent to [facility] ED with worsening shortness of breath 12/1 transfer to [facility] at family request  COVID-19 specific Treatment: Remdesivir 11/30 > 12/4 Decadron 11/30 > Convalescent plasma - refused   Subjective: The patient's wife is very much interested in him being discharged home as his significant communication difficulties lead to poor care and severe isolation in the rehab facility per her opinion.  He is unfortunately not doing very well with therapy thus far.  He is resting comfortably in bed today.  He denies chest pain nausea vomiting abdominal pain or any other complaints.  He reports he has a good appetite.  He appears to be able to hear me when I lean down and speak out in various high-volume into his left ear.  Assessment & Plan:  Covid pneumonia - acute on chronic hypoxic respiratory failure Completes his course of remdesivir today -continue Decadron to complete 10 days of treatment - refused convalescent plasma initially and now has improved a point that further therapies are not indicated-clinically appears to  be improving -inflammatory markers trending in a favorable direction -titrate oxygen as able  Recent Labs  Lab 06/02/19 0535 06/03/19 0210 06/04/19 0205 06/05/19 0155  DDIMER 2.32* 1.83* 1.91* 2.17*  FERRITIN 421* 360* 390* 235  CRP 23.5* 20.1* 10.9* 6.1*  ALT 17 22 46* 101*  PROCALCITON 0.70  --   --   --     COPD with chronic hypoxic respiratory failure No active wheezing   Recent trauma with multiple lower extremity fractures status post surgery 11/6 and 11/8 Dr. Laurell Josephs -sutures removed 12/1 -wounds appear completely stable/healed -continue PT/OT - discuss f/u w/ Ortho prior to d/c   Chronic diastolic CHF No gross volume overload on physical exam -net negative approximately 4200 cc since admission  Mild hypokalemia Continue to supplement - magnesium is normal  Mild transaminitis Likely due to Covid and remdesivir -following trend  HLD Continue usual home medical therapy  Chronic atrial fibrillation status post AV nodal ablation 2010 with BiV PPM On chronic warfarin which is being dosed by pharmacy  Supratherapeutic INR Warfarin dosing per pharmacy  Stage II NSCLC Status post right upper lobe resection 2015  BPH Asymptomatic presently  GERD  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 rate 20, height 6' (  1.829 m), weight 76 kg, SpO2 95 %.  Intake/Output Summary (Last 24 hours) at 06/05/2019 0914 Last data filed at 06/05/2019 0247 Gross per 24 hour  Intake 120 ml  Output 1350 ml  Net -1230 ml   Filed Weights   06/03/19 1211  06/04/19 0456 06/05/19 0412  Weight: 80.4 kg 80.7 kg 76 kg    Examination: General: No acute respiratory distress Lungs: Clear to auscultation with no wheezing Cardiovascular: RRR without murmur or rub Abdomen: NT/ND, soft, no rebound Extremities: No edema B LE  CBC: Recent Labs  Lab 06/03/19 0210 06/04/19 0205 06/05/19 0155  WBC 8.3 8.6 10.4  NEUTROABS 7.7 7.8* 8.9*  HGB 11.3* 11.4* 12.0*  HCT 35.5* 36.4* 36.9*  MCV 93.9 93.1 91.3  PLT 243 280 426   Basic Metabolic Panel: Recent Labs  Lab 06/03/19 0210 06/04/19 0205 06/05/19 0155  NA 138 140 138  K 4.1 3.4* 3.4*  CL 98 101 99  CO2 30 28 26   GLUCOSE 105* 114* 97  BUN 33* 36* 36*  CREATININE 0.84 0.88 0.93  CALCIUM 9.1 9.0 8.8*  MG  --   --  1.9   GFR: Estimated Creatinine Clearance: 60.2 mL/min (by C-G formula based on SCr of 0.93 mg/dL).  Liver Function Tests: Recent Labs  Lab 06/02/19 0535 06/03/19 0210 06/04/19 0205 06/05/19 0155  AST 26 32 57* 83*  ALT 17 22 46* 101*  ALKPHOS 97 99 102 105  BILITOT 0.9 1.1 1.3* 1.3*  PROT 5.8* 6.3* 6.0* 5.9*  ALBUMIN 2.5* 2.7* 2.7* 2.6*    Coagulation Profile: Recent Labs  Lab 06/02/19 0535 06/03/19 0210 06/04/19 0205 06/05/19 0155  INR 4.2* 3.2* 3.3* 4.1*    Recent Results (from the past 240 hour(s))  MRSA PCR Screening     Status: Abnormal   Collection Time: 06/02/19  4:57 AM   Specimen: Nasopharyngeal Wash  Result Value Ref Range Status   MRSA by PCR POSITIVE (A) NEGATIVE Final    Comment:        The GeneXpert MRSA Assay (FDA approved for NASAL specimens only), is one component of a comprehensive MRSA colonization surveillance program. It is not intended to diagnose MRSA infection nor to guide or monitor treatment for MRSA infections. Performed at [facility], [place] [address], [place], [place] [address]      Scheduled Meds: . cholecalciferol  2,000 Units Oral Q12H  . darifenacin  7.5 mg Oral Daily  . dexamethasone  6  mg Oral Daily  . docusate sodium  100 mg Oral Daily  . guaiFENesin-dextromethorphan  10 mL Oral TID  . iron polysaccharides  150 mg Oral Daily  . pantoprazole  40 mg Oral Daily  . torsemide  10 mg Oral Daily  . vitamin C  500 mg Oral Daily  . Warfarin - Pharmacist Dosing Inpatient   Does not apply q1800  . zinc sulfate  220 mg Oral Daily   Continuous Infusions: . remdesivir 100 mg in NS 100 mL 100 mg (06/04/19 1041)     LOS: 3 days   Cherene Altes, MD [facility] Office  [phone] Pager - Text Page per Shea Evans  If 7PM-7AM, please contact night-coverage per Amion 06/05/2019, 9:14 AM

## 2019-06-05 NOTE — Progress Notes (Signed)
[place] for Warfarin Indication: atrial fibrillation  Allergies  Allergen Reactions  . Gabapentin Other (See Comments)    Drowsiness   . Nsaids Other (See Comments)    NOT SUPPOSED TO TAKE DUE TO BLOOD THINNERS    Patient Measurements: Height: 6' (182.9 cm) Weight: 167 lb 8.8 oz (76 kg) IBW/kg (Calculated) : 77.6  Vital Signs: Temp: 98.5 F (36.9 C) (12/04 0742) Temp Source: Oral (12/04 0742) BP: 126/72 (12/04 0742) Pulse Rate: 82 (12/04 0412)  Labs: Recent Labs    06/03/19 0210 06/04/19 0205 06/05/19 0155  HGB 11.3* 11.4* 12.0*  HCT 35.5* 36.4* 36.9*  PLT 243 280 275  LABPROT 32.8* 33.7* 39.5*  INR 3.2* 3.3* 4.1*  CREATININE 0.84 0.88 0.93    Estimated Creatinine Clearance: 60.2 mL/min (by C-G formula based on SCr of 0.93 mg/dL).  Medications:  Per med history patient does NOT take 2.5 mg tablets with dosing of 1 1/4 tablet (3.125 mg) daily except 1 tablet (2.5 mg) on Friday, but is instead on 2mg  daily with dose on 11/29 held per MD (no reason given).   Assessment: 85 y/o M from SNF where he was recovering post-surgery admitted with respiratory failure due to SARS-CoV-2 disease. Pateint has a h/o CAF on warfarin.   -INR is above goal today at 4.2>>3.2>>3.3>4.1, no bleeding noted   Goal of Therapy:  INR 2-3   Plan:   -Hold warfarin today  -Daily INR  Malakhai Beitler A. Levada Dy, PharmD, BCPS, FNKF Clinical Pharmacist [place] Please utilize Amion for appropriate phone number to reach the unit pharmacist ([place])

## 2019-06-05 NOTE — Progress Notes (Signed)
CRITICAL VALUE ALERT  Critical Value:  INR 4.1  Date & Time Notied:  06/05/2019 & 04:56  Provider Notified: Dr. Myna Hidalgo

## 2019-06-05 NOTE — NC FL2 (Signed)
[place] MEDICAID FL2 LEVEL OF CARE SCREENING TOOL     IDENTIFICATION  Patient Name: Jerome Newman Birthdate: 1930/02/05 Sex: male Admission Date (Current Location): 06/02/2019  [facility] and [place] Number:  Herbalist and Address:  [facility] [place], [place] [address] [place], [place] [address]      Provider Number: [id number]  Attending Physician Name and Address:  Cherene Altes, MD  Relative Name and Phone Number:  Ariyon Mittleman [phone]    Current Level of Care: Hospital Recommended Level of Care: [place] Prior Approval Number:    Date Approved/Denied:   PASRR Number: [id number]  Discharge Plan: SNF    Current Diagnoses: Patient Active Problem List   Diagnosis Date Noted  . Acute hypoxemic respiratory failure due to severe acute respiratory syndrome coronavirus 2 (SARS-CoV-2) disease ([place]) 06/02/2019  . Traumatic compartment syndrome of right lower extremity ([place]) 05/10/2019  . Closed displaced supracondylar fracture of distal end of left femur with intracondylar extension ([place]) 05/10/2019  . Closed bicondylar fracture of proximal end of right tibia 05/08/2019  . Acute pancreatitis 08/30/2018  . Encounter for therapeutic drug monitoring 01/03/2017  . Diastolic congestive heart failure ([place]) 12/31/2016  . Hyperlipidemia 12/31/2016  . OSA (obstructive sleep apnea) 12/31/2016  . Oxygen dependent 12/31/2016  . Chronic anticoagulation 12/31/2016  . Chronic atrial fibrillation ([place]) 12/31/2016  . Lung fibrosis ([place]) 06/01/2016  . Status post biventricular pacemaker 01/18/2016  . S/P AV nodal ablation 01/18/2016  . CAD (coronary artery disease) 09/28/2015  . COPD, mild ([place]) 09/28/2015  . Malignant neoplasm of upper lobe of left lung ([place]) 02/28/2015  . DVT (deep venous thrombosis) ([place]) 09/26/2013  . Pulmonary embolism ([place]) 08/10/2013    Orientation RESPIRATION BLADDER Height & Weight     Self, Time, Place  Normal  Incontinent Weight: 167 lb 8.8 oz (76 kg) Height:  6' (182.9 cm)  BEHAVIORAL SYMPTOMS/MOOD NEUROLOGICAL BOWEL NUTRITION STATUS      Continent Diet(heart healthy)  AMBULATORY STATUS COMMUNICATION OF NEEDS Skin   Extensive Assist Verbally Surgical wounds(old, closed incisions on right and left leg; no dressing)                       Personal Care Assistance Level of Assistance  Bathing, Feeding, Dressing Bathing Assistance: Maximum assistance Feeding assistance: Independent Dressing Assistance: Maximum assistance     Functional Limitations Info  Hearing   Hearing Info: Impaired(hard of hearing)      SPECIAL CARE FACTORS FREQUENCY  PT (By licensed PT), OT (By licensed OT)     PT Frequency: 5x/wk OT Frequency: 5x/wk            Contractures Contractures Info: Not present    Additional Factors Info  Code Status, Allergies, Isolation Precautions Code Status Info: Full Allergies Info: Gabapentin, Nsaids     Isolation Precautions Info: Airborne/Contact, COVID+     Current Medications (06/05/2019):  This is the current hospital active medication list Current Facility-Administered Medications  Medication Dose Route Frequency Provider Last Rate Last Dose  . acetaminophen (TYLENOL) tablet 650 mg  650 mg Oral Q6H PRN Peyton Bottoms, MD      . benzonatate (TESSALON) capsule 100 mg  100 mg Oral TID PRN Cherene Altes, MD      . chlorpheniramine-HYDROcodone (TUSSIONEX) 10-8 MG/5ML suspension 5 mL  5 mL Oral Q12H PRN Peyton Bottoms, MD      . cholecalciferol (VITAMIN D3) tablet 2,000 Units  2,000 Units  Oral Q12H Cherene Altes, MD   2,000 Units at 06/05/19 [phone]  . darifenacin (ENABLEX) 24 hr tablet 7.5 mg  7.5 mg Oral Daily Vance Gather B, MD   7.5 mg at 06/05/19 0908  . dexamethasone (DECADRON) tablet 6 mg  6 mg Oral Daily Cherene Altes, MD   6 mg at 06/05/19 1062  . docusate sodium (COLACE) capsule 100 mg  100 mg Oral Daily Peyton Bottoms, MD   100 mg at 06/05/19 0907   . guaiFENesin-dextromethorphan (ROBITUSSIN DM) 100-10 MG/5ML syrup 10 mL  10 mL Oral TID Patrecia Pour, MD   10 mL at 06/05/19 0907  . Ipratropium-Albuterol (COMBIVENT) respimat 1 puff  1 puff Inhalation Q6H PRN Joette Catching T, MD      . iron polysaccharides (NIFEREX) capsule 150 mg  150 mg Oral Daily Patrecia Pour, MD   150 mg at 06/05/19 0908  . pantoprazole (PROTONIX) EC tablet 40 mg  40 mg Oral Daily Patrecia Pour, MD   40 mg at 06/05/19 0908  . polyvinyl alcohol (LIQUIFILM TEARS) 1.4 % ophthalmic solution 1 drop  1 drop Both Eyes Q6H PRN Joette Catching T, MD      . potassium chloride SA (KLOR-CON) CR tablet 40 mEq  40 mEq Oral Daily Joette Catching T, MD      . torsemide [facility]) tablet 10 mg  10 mg Oral Daily Patrecia Pour, MD   10 mg at 06/05/19 0908  . vitamin C (ASCORBIC ACID) tablet 500 mg  500 mg Oral Daily Peyton Bottoms, MD   500 mg at 06/05/19 0907  . Warfarin - Pharmacist Dosing Inpatient   Does not apply [id number] Patrecia Pour, MD   Stopped at 06/04/19 1800  . zinc sulfate capsule 220 mg  220 mg Oral Daily Peyton Bottoms, MD   220 mg at 06/05/19 0908     Discharge Medications: Please see discharge summary for a list of discharge medications.  Relevant Imaging Results:  Relevant Lab Results:   Additional Information SS#: [id number]  Geralynn Ochs, LCSW

## 2019-06-06 LAB — CBC WITH DIFFERENTIAL/PLATELET
Abs Immature Granulocytes: 0.33 10*3/uL — ABNORMAL HIGH (ref 0.00–0.07)
Basophils Absolute: 0 10*3/uL (ref 0.0–0.1)
Basophils Relative: 0 %
Eosinophils Absolute: 0 10*3/uL (ref 0.0–0.5)
Eosinophils Relative: 0 %
HCT: 38 % — ABNORMAL LOW (ref 39.0–52.0)
Hemoglobin: 12.2 g/dL — ABNORMAL LOW (ref 13.0–17.0)
Immature Granulocytes: 3 %
Lymphocytes Relative: 5 %
Lymphs Abs: 0.5 10*3/uL — ABNORMAL LOW (ref 0.7–4.0)
MCH: 29.5 pg (ref 26.0–34.0)
MCHC: 32.1 g/dL (ref 30.0–36.0)
MCV: 91.8 fL (ref 80.0–100.0)
Monocytes Absolute: 0.5 10*3/uL (ref 0.1–1.0)
Monocytes Relative: 5 %
Neutro Abs: 8.3 10*3/uL — ABNORMAL HIGH (ref 1.7–7.7)
Neutrophils Relative %: 87 %
Platelets: 259 10*3/uL (ref 150–400)
RBC: 4.14 MIL/uL — ABNORMAL LOW (ref 4.22–5.81)
RDW: 16.6 % — ABNORMAL HIGH (ref 11.5–15.5)
WBC: 9.6 10*3/uL (ref 4.0–10.5)
nRBC: 0 % (ref 0.0–0.2)

## 2019-06-06 LAB — COMPREHENSIVE METABOLIC PANEL
ALT: 86 U/L — ABNORMAL HIGH (ref 0–44)
AST: 48 U/L — ABNORMAL HIGH (ref 15–41)
Albumin: 2.6 g/dL — ABNORMAL LOW (ref 3.5–5.0)
Alkaline Phosphatase: 113 U/L (ref 38–126)
Anion gap: 11 (ref 5–15)
BUN: 37 mg/dL — ABNORMAL HIGH (ref 8–23)
CO2: 31 mmol/L (ref 22–32)
Calcium: 9.2 mg/dL (ref 8.9–10.3)
Chloride: 100 mmol/L (ref 98–111)
Creatinine, Ser: 0.89 mg/dL (ref 0.61–1.24)
GFR calc Af Amer: >60 mL/min (ref >60)
GFR calc non Af Amer: >60 mL/min (ref >60)
Glucose, Bld: 101 mg/dL — ABNORMAL HIGH (ref 70–99)
Potassium: 4 mmol/L (ref 3.5–5.1)
Sodium: 142 mmol/L (ref 135–145)
Total Bilirubin: 1.3 mg/dL — ABNORMAL HIGH (ref 0.3–1.2)
Total Protein: 6.2 g/dL — ABNORMAL LOW (ref 6.5–8.1)

## 2019-06-06 LAB — C-REACTIVE PROTEIN: CRP: 8.6 mg/dL — ABNORMAL HIGH (ref <1.0)

## 2019-06-06 LAB — D-DIMER, QUANTITATIVE: D-Dimer, Quant: 3.52 ug/mL-FEU — ABNORMAL HIGH (ref 0.00–0.50)

## 2019-06-06 LAB — PROTIME-INR
INR: 3.7 — ABNORMAL HIGH (ref 0.8–1.2)
Prothrombin Time: 36.9 seconds — ABNORMAL HIGH (ref 11.4–15.2)

## 2019-06-06 LAB — FERRITIN: Ferritin: 199 ng/mL (ref 24–336)

## 2019-06-06 NOTE — Progress Notes (Signed)
Jerome Newman  BTD:[id number] DOB: 1930/06/20 DOA: 06/02/2019 PCP: [name]., MD    Brief Narrative:  85 year old with a history of lung cancer status post resection, chronic atrial fibrillation, COPD requiring 2 L nasal cannula O2 support, GERD, and HTN who presented as a transfer from [place] where he presented with worsening hypoxic respiratory failure due to Covid.  He was previously admitted 11/6 > 11/12 with a right proximal tibia and left distal femur fracture following a traumatic accident.  He was able to be discharged to a rehab center where he was progressing until he began to develop shortness of breath.  In the ED a CXR noted multifocal pulmonary infiltrates.  Significant Events: 11/6-11/12 traumatic lower extremity fractures requiring surgical intervention/hospitalization 11/22 Covid positive test at rehab facility 11/30 sent to [place] ED with worsening shortness of breath 12/1 transfer to [facility] at family request  COVID-19 specific Treatment: Remdesivir 11/30 > 12/4 Decadron 11/30 > Convalescent plasma - refused   Subjective: Resting comfortably in bed.  Unable to communicate with him well using a dry erase board.  He tells me he is having no complaints but is motivated to get moving and get out of the hospital.  I have explained to him the need for rehab and he accepts this.  Assessment & Plan:  Covid pneumonia - acute on chronic hypoxic respiratory failure Has completed the course of remdesivir -continue Decadron to complete 10 days of treatment - refused convalescent plasma initially and now has improved to the point that further therapies are not indicated - titrate oxygen as able  Recent Labs  Lab 06/02/19 0535 06/03/19 0210 06/04/19 0205 06/05/19 0155 06/06/19 0147  DDIMER 2.32* 1.83* 1.91* 2.17* 3.52*  FERRITIN 421* 360* 390* 235 199  CRP 23.5* 20.1* 10.9* 6.1* 8.6*  ALT 17 22 46* 101* 86*  PROCALCITON 0.70  --   --   --    --    COPD with chronic hypoxic respiratory failure No active wheezing   Recent trauma with multiple lower extremity fractures status post surgery 11/6 and 11/8 Dr. Laurell Josephs -sutures removed 12/1 -wounds appear completely stable/healed -continue PT/OT - discuss f/u w/ Ortho prior to d/c   Chronic diastolic CHF No gross volume overload on physical exam - net negative approximately 5500 cc since admission  Mild hypokalemia Corrected with supplementation  Mild transaminitis Likely due to Covid and remdesivir -trending downward  HLD Continue usual home medical therapy  Chronic atrial fibrillation status post AV nodal ablation 2010 with BiV PPM On chronic warfarin which is being dosed by pharmacy  Supratherapeutic INR Warfarin dosing per pharmacy  Stage II NSCLC Status post right upper lobe resection 2015  BPH Asymptomatic presently  GERD  Anemia due to surgical and traumatic blood loss Hemoglobin stable/slowly improving  Hard of hearing  DVT prophylaxis: Warfarin Code Status: FULL CODE Family Communication:  Disposition Plan: MedSurg bed - PT/OT evals - for SNF rehab stay - now medically stable for d/c   Consultants:  none  Antimicrobials:  None  Objective: Blood pressure 128/75, pulse 80, temperature 98 F (36.7 C), temperature source Oral, resp. rate 16, height 6' (1.829 m), weight 78.8 kg, SpO2 99 %.  Intake/Output Summary (Last 24 hours) at 06/06/2019 0926 Last data filed at 06/06/2019 0643 Gross per 24 hour  Intake 280 ml  Output 1900 ml  Net -1620 ml   Filed Weights   06/05/19 0412 06/06/19 0619 06/06/19 0850  Weight: 76 kg 78.7  kg 78.8 kg    Examination: General: No acute respiratory distress Lungs: Clear to auscultation B - no wheezing  Cardiovascular: RRR  Abdomen: NT/ND, soft, no rebound Extremities: No edema B lower extremities  CBC: Recent Labs  Lab 06/04/19 0205 06/05/19 0155 06/06/19 0147  WBC 8.6 10.4 9.6  NEUTROABS 7.8* 8.9*  8.3*  HGB 11.4* 12.0* 12.2*  HCT 36.4* 36.9* 38.0*  MCV 93.1 91.3 91.8  PLT 280 275 716   Basic Metabolic Panel: Recent Labs  Lab 06/04/19 0205 06/05/19 0155 06/06/19 0147  NA 140 138 142  K 3.4* 3.4* 4.0  CL 101 99 100  CO2 28 26 31   GLUCOSE 114* 97 101*  BUN 36* 36* 37*  CREATININE 0.88 0.93 0.89  CALCIUM 9.0 8.8* 9.2  MG  --  1.9  --    GFR: Estimated Creatinine Clearance: 64.2 mL/min (by C-G formula based on SCr of 0.89 mg/dL).  Liver Function Tests: Recent Labs  Lab 06/03/19 0210 06/04/19 0205 06/05/19 0155 06/06/19 0147  AST 32 57* 83* 48*  ALT 22 46* 101* 86*  ALKPHOS 99 102 105 113  BILITOT 1.1 1.3* 1.3* 1.3*  PROT 6.3* 6.0* 5.9* 6.2*  ALBUMIN 2.7* 2.7* 2.6* 2.6*    Coagulation Profile: Recent Labs  Lab 06/02/19 0535 06/03/19 0210 06/04/19 0205 06/05/19 0155 06/06/19 0147  INR 4.2* 3.2* 3.3* 4.1* 3.7*    Recent Results (from the past 240 hour(s))  MRSA PCR Screening     Status: Abnormal   Collection Time: 06/02/19  4:57 AM   Specimen: Nasopharyngeal Wash  Result Value Ref Range Status   MRSA by PCR POSITIVE (A) NEGATIVE Final    Comment:        The GeneXpert MRSA Assay (FDA approved for NASAL specimens only), is one component of a comprehensive MRSA colonization surveillance program. It is not intended to diagnose MRSA infection nor to guide or monitor treatment for MRSA infections. Performed at [facility], [place] [address] [place], [place] [address]      Scheduled Meds: . cholecalciferol  2,000 Units Oral Q12H  . darifenacin  7.5 mg Oral Daily  . dexamethasone  6 mg Oral Daily  . docusate sodium  100 mg Oral Daily  . guaiFENesin-dextromethorphan  10 mL Oral TID  . iron polysaccharides  150 mg Oral Daily  . pantoprazole  40 mg Oral Daily  . potassium chloride  40 mEq Oral Daily  . torsemide  10 mg Oral Daily  . vitamin C  500 mg Oral Daily  . Warfarin - Pharmacist Dosing Inpatient   Does not apply q1800  .  zinc sulfate  220 mg Oral Daily      LOS: 4 days   Cherene Altes, MD [facility] Office  [phone] Pager - Text Page per Amion  If 7PM-7AM, please contact night-coverage per Amion 06/06/2019, 9:26 AM

## 2019-06-06 NOTE — Progress Notes (Signed)
[place] for Warfarin Indication: atrial fibrillation  Allergies  Allergen Reactions  . Gabapentin Other (See Comments)    Drowsiness   . Nsaids Other (See Comments)    NOT SUPPOSED TO TAKE DUE TO BLOOD THINNERS    Patient Measurements: Height: 6' (182.9 cm) Weight: 173 lb 11.6 oz (78.8 kg) IBW/kg (Calculated) : 77.6  Vital Signs: Temp: 98.2 F (36.8 C) (12/05 0850) Temp Source: Oral (12/05 0850) BP: 128/75 (12/05 0850) Pulse Rate: 80 (12/05 0850)  Labs: Recent Labs    06/04/19 0205 06/05/19 0155 06/06/19 0147  HGB 11.4* 12.0* 12.2*  HCT 36.4* 36.9* 38.0*  PLT 280 275 259  LABPROT 33.7* 39.5* 36.9*  INR 3.3* 4.1* 3.7*  CREATININE 0.88 0.93 0.89    Estimated Creatinine Clearance: 64.2 mL/min (by C-G formula based on SCr of 0.89 mg/dL).  Medications:  Per med history patient does NOT take 2.5 mg tablets with dosing of 1 1/4 tablet (3.125 mg) daily except 1 tablet (2.5 mg) on Friday, but is instead on 2mg  daily with dose on 11/29 held per MD (no reason given).   Assessment: 85 y/o M from SNF where he was recovering post-surgery admitted with respiratory failure due to SARS-CoV-2 disease. Pateint has a h/o CAF on warfarin.   -INR remains above goal today at 3.7 - warfarin held x 3 days, no bleeding noted   Goal of Therapy:  INR 2-3   Plan:   -Hold warfarin again today  -Daily INR  Sherlon Handing, PharmD, BCPS Please see amion for complete clinical pharmacist phone list 06/06/2019 9:54 AM

## 2019-06-06 NOTE — TOC Progression Note (Signed)
Transition of Care [facility]) - Progression Note    Patient Details  Name: Eliab Closson MRN: [id number] Date of Birth: Jul 14, 1929  Transition of Care [facility] CM/SW [place], [place] Phone Number: 06/06/2019, 1:56 PM  Clinical Narrative:   CSW reached out to [facility] to inquire about them taking the patient back now that he is COVID+. CSW spoke with a couple of the nurses at the rehab center who believed that they would be able to take him back, but that the Admissions Director won't be back into the office until Monday to make the final decision. Per RN Heidi, they have taken several of their rehab patients back who were COVID+, so she did not see that there would be a problem. CSW contacted wife [name] by phone to update her on discussions with [facility] and she was appreciative of information. CSW to reach out on Monday about patient returning to SNF.    Expected Discharge Plan: [place] Barriers to Discharge: Continued Medical Work up  Expected Discharge Plan and Services Expected Discharge Plan: [place] Choice: [place] arrangements for the past 2 months: Single Family Home, [place]                                       Social Determinants of Health (SDOH) Interventions    Readmission Risk Interventions No flowsheet data found.

## 2019-06-07 LAB — PROTIME-INR
INR: 3.5 — ABNORMAL HIGH (ref 0.8–1.2)
Prothrombin Time: 34.8 seconds — ABNORMAL HIGH (ref 11.4–15.2)

## 2019-06-07 LAB — CBC WITH DIFFERENTIAL/PLATELET
Abs Immature Granulocytes: 0.43 10*3/uL — ABNORMAL HIGH (ref 0.00–0.07)
Basophils Absolute: 0.1 10*3/uL (ref 0.0–0.1)
Basophils Relative: 1 %
Eosinophils Absolute: 0 10*3/uL (ref 0.0–0.5)
Eosinophils Relative: 0 %
HCT: 37 % — ABNORMAL LOW (ref 39.0–52.0)
Hemoglobin: 11.7 g/dL — ABNORMAL LOW (ref 13.0–17.0)
Immature Granulocytes: 4 %
Lymphocytes Relative: 4 %
Lymphs Abs: 0.5 10*3/uL — ABNORMAL LOW (ref 0.7–4.0)
MCH: 29.4 pg (ref 26.0–34.0)
MCHC: 31.6 g/dL (ref 30.0–36.0)
MCV: 93 fL (ref 80.0–100.0)
Monocytes Absolute: 0.4 10*3/uL (ref 0.1–1.0)
Monocytes Relative: 4 %
Neutro Abs: 10.6 10*3/uL — ABNORMAL HIGH (ref 1.7–7.7)
Neutrophils Relative %: 87 %
Platelets: 252 10*3/uL (ref 150–400)
RBC: 3.98 MIL/uL — ABNORMAL LOW (ref 4.22–5.81)
RDW: 16.8 % — ABNORMAL HIGH (ref 11.5–15.5)
WBC: 12 10*3/uL — ABNORMAL HIGH (ref 4.0–10.5)
nRBC: 0 % (ref 0.0–0.2)

## 2019-06-07 LAB — COMPREHENSIVE METABOLIC PANEL
ALT: 62 U/L — ABNORMAL HIGH (ref 0–44)
AST: 31 U/L (ref 15–41)
Albumin: 2.5 g/dL — ABNORMAL LOW (ref 3.5–5.0)
Alkaline Phosphatase: 103 U/L (ref 38–126)
Anion gap: 13 (ref 5–15)
BUN: 38 mg/dL — ABNORMAL HIGH (ref 8–23)
CO2: 29 mmol/L (ref 22–32)
Calcium: 9.1 mg/dL (ref 8.9–10.3)
Chloride: 99 mmol/L (ref 98–111)
Creatinine, Ser: 0.88 mg/dL (ref 0.61–1.24)
GFR calc Af Amer: >60 mL/min (ref >60)
GFR calc non Af Amer: >60 mL/min (ref >60)
Glucose, Bld: 101 mg/dL — ABNORMAL HIGH (ref 70–99)
Potassium: 4.2 mmol/L (ref 3.5–5.1)
Sodium: 141 mmol/L (ref 135–145)
Total Bilirubin: 1.3 mg/dL — ABNORMAL HIGH (ref 0.3–1.2)
Total Protein: 5.9 g/dL — ABNORMAL LOW (ref 6.5–8.1)

## 2019-06-07 LAB — C-REACTIVE PROTEIN: CRP: 5.5 mg/dL — ABNORMAL HIGH (ref <1.0)

## 2019-06-07 LAB — D-DIMER, QUANTITATIVE: D-Dimer, Quant: 1.93 ug/mL-FEU — ABNORMAL HIGH (ref 0.00–0.50)

## 2019-06-07 LAB — FERRITIN: Ferritin: 148 ng/mL (ref 24–336)

## 2019-06-07 MED ORDER — POTASSIUM CHLORIDE CRYS ER 20 MEQ PO TBCR
20.0000 meq | EXTENDED_RELEASE_TABLET | Freq: Every day | ORAL | Status: DC
  Administered 2019-06-08: 20 meq via ORAL
  Filled 2019-06-07: qty 1

## 2019-06-07 NOTE — Progress Notes (Addendum)
Pt wife [name] was updated and all questions were answered. We discussed the plan for potential discharge tomorrow pending the facilities admission directors acceptance. Ann expressed concern of him not progressing since surgery and not being in rehab the last month.

## 2019-06-07 NOTE — Progress Notes (Signed)
Jerome Newman  TIW:[id number] DOB: 1929-10-23 DOA: 06/02/2019 PCP: [name]., MD    Brief Narrative:  85 year old with a history of lung cancer status post resection, chronic atrial fibrillation, COPD requiring 2 L nasal cannula O2 support, GERD, and HTN who presented as a transfer from [place] where he presented with worsening hypoxic respiratory failure due to Covid.  He was previously admitted 11/6 > 11/12 with a right proximal tibia and left distal femur fracture following a traumatic accident.  He was able to be discharged to a rehab center where he was progressing until he began to develop shortness of breath.  In the ED a CXR noted multifocal pulmonary infiltrates.  Significant Events: 11/6-11/12 traumatic lower extremity fractures requiring surgical intervention/hospitalization 11/22 Covid positive test at rehab facility 11/30 sent to [place] ED with worsening shortness of breath 12/1 transfer to [facility] at family request  COVID-19 specific Treatment: Remdesivir 11/30 > 12/4 Decadron 11/30 > Convalescent plasma - refused   Subjective: Patient is resting comfortably in bed.  He is in good spirits.  He is motivated to persist beta rehab.  We are able to communicate using a dry erase board due to his severe hearing difficulty.  Assessment & Plan:  Covid pneumonia - acute on chronic hypoxic respiratory failure Has completed a course of remdesivir - continue Decadron to complete 10 days of treatment - refused convalescent plasma initially and then improved to the point that further therapies were not indicated -patient has been titrated back to his baseline 2 L nasal cannula oxygen support  Recent Labs  Lab 06/02/19 0535 06/03/19 0210 06/04/19 0205 06/05/19 0155 06/06/19 0147 06/07/19 0135  DDIMER 2.32* 1.83* 1.91* 2.17* 3.52* 1.93*  FERRITIN 421* 360* 390* 235 199 148  CRP 23.5* 20.1* 10.9* 6.1* 8.6* 5.5*  ALT 17 22 46* 101* 86* 62*   PROCALCITON 0.70  --   --   --   --   --    COPD with chronic hypoxic respiratory failure No active wheezing -baseline oxygen support required is 2 L nasal cannula  Recent trauma with multiple lower extremity fractures status post surgery 11/6 and 11/8 Dr. Doreatha Martin -sutures removed 12/1 -wounds appear completely stable/healed -continue PT/OT - discuss f/u w/ Ortho prior to d/c   Chronic diastolic CHF No gross volume overload on physical exam - net negative approximately 6800 cc since admission  Mild hypokalemia Corrected with supplementation  Mild transaminitis Likely due to Covid and remdesivir -trending downward  HLD Continue usual home medical therapy  Chronic atrial fibrillation status post AV nodal ablation 2010 with BiV PPM On chronic warfarin which is being dosed by pharmacy  Supratherapeutic INR Warfarin dosing per pharmacy  Stage II NSCLC Status post right upper lobe resection 2015  BPH Asymptomatic presently  GERD  Anemia due to surgical and traumatic blood loss Hemoglobin stable/slowly improving  Hard of hearing  DVT prophylaxis: Warfarin Code Status: FULL CODE Family Communication:  Disposition Plan: MedSurg bed - PT/OT evals - for SNF rehab stay - now medically stable for d/c when rehab bed arranged  Consultants:  none  Antimicrobials:  None  Objective: Blood pressure 122/71, pulse 80, temperature 97.9 F (36.6 C), temperature source Oral, resp. rate 18, height 6' (1.829 m), weight 78.8 kg, SpO2 93 %.  Intake/Output Summary (Last 24 hours) at 06/07/2019 0947 Last data filed at 06/06/2019 1447 Gross per 24 hour  Intake -  Output 700 ml  Net -700 ml   Jerome Newman  Weights   06/05/19 0412 06/06/19 0619 06/06/19 0850  Weight: 76 kg 78.7 kg 78.8 kg    Examination: General: No acute respiratory distress Lungs: Clear to auscultation B  Cardiovascular: RRR  Abdomen: NT/ND, soft, no rebound Extremities: No edema B LE  CBC: Recent Labs  Lab 06/05/19  0155 06/06/19 0147 06/07/19 0135  WBC 10.4 9.6 12.0*  NEUTROABS 8.9* 8.3* 10.6*  HGB 12.0* 12.2* 11.7*  HCT 36.9* 38.0* 37.0*  MCV 91.3 91.8 93.0  PLT 275 259 096   Basic Metabolic Panel: Recent Labs  Lab 06/05/19 0155 06/06/19 0147 06/07/19 0135  NA 138 142 141  K 3.4* 4.0 4.2  CL 99 100 99  CO2 26 31 29   GLUCOSE 97 101* 101*  BUN 36* 37* 38*  CREATININE 0.93 0.89 0.88  CALCIUM 8.8* 9.2 9.1  MG 1.9  --   --    GFR: Estimated Creatinine Clearance: 64.9 mL/min (by C-G formula based on SCr of 0.88 mg/dL).  Liver Function Tests: Recent Labs  Lab 06/04/19 0205 06/05/19 0155 06/06/19 0147 06/07/19 0135  AST 57* 83* 48* 31  ALT 46* 101* 86* 62*  ALKPHOS 102 105 113 103  BILITOT 1.3* 1.3* 1.3* 1.3*  PROT 6.0* 5.9* 6.2* 5.9*  ALBUMIN 2.7* 2.6* 2.6* 2.5*    Coagulation Profile: Recent Labs  Lab 06/03/19 0210 06/04/19 0205 06/05/19 0155 06/06/19 0147 06/07/19 0135  INR 3.2* 3.3* 4.1* 3.7* 3.5*    Recent Results (from the past 240 hour(s))  MRSA PCR Screening     Status: Abnormal   Collection Time: 06/02/19  4:57 AM   Specimen: Nasopharyngeal Wash  Result Value Ref Range Status   MRSA by PCR POSITIVE (A) NEGATIVE Final    Comment:        The GeneXpert MRSA Assay (FDA approved for NASAL specimens only), is one component of a comprehensive MRSA colonization surveillance program. It is not intended to diagnose MRSA infection nor to guide or monitor treatment for MRSA infections. Performed at [facility], [place] [address], [place], [place] [address]      Scheduled Meds: . cholecalciferol  2,000 Units Oral Q12H  . darifenacin  7.5 mg Oral Daily  . dexamethasone  6 mg Oral Daily  . docusate sodium  100 mg Oral Daily  . guaiFENesin-dextromethorphan  10 mL Oral TID  . iron polysaccharides  150 mg Oral Daily  . pantoprazole  40 mg Oral Daily  . potassium chloride  40 mEq Oral Daily  . torsemide  10 mg Oral Daily  . vitamin C  500 mg  Oral Daily  . Warfarin - Pharmacist Dosing Inpatient   Does not apply q1800  . zinc sulfate  220 mg Oral Daily      LOS: 5 days   Cherene Altes, MD [facility] Office  [phone] Pager - Text Page per Amion  If 7PM-7AM, please contact night-coverage per Amion 06/07/2019, 9:47 AM

## 2019-06-07 NOTE — Progress Notes (Signed)
Floor to floor report given to Langston Masker, RN.

## 2019-06-07 NOTE — Progress Notes (Signed)
[place] for Warfarin Indication: atrial fibrillation  Allergies  Allergen Reactions  . Gabapentin Other (See Comments)    Drowsiness   . Nsaids Other (See Comments)    NOT SUPPOSED TO TAKE DUE TO BLOOD THINNERS    Patient Measurements: Height: 6' (182.9 cm) Weight: 173 lb 11.6 oz (78.8 kg) IBW/kg (Calculated) : 77.6  Vital Signs: Temp: 97.9 F (36.6 C) (12/06 0413) Temp Source: Oral (12/06 0413) BP: 122/71 (12/06 0413) Pulse Rate: 80 (12/06 0413)  Labs: Recent Labs    06/05/19 0155 06/06/19 0147 06/07/19 0135  HGB 12.0* 12.2* 11.7*  HCT 36.9* 38.0* 37.0*  PLT 275 259 252  LABPROT 39.5* 36.9* 34.8*  INR 4.1* 3.7* 3.5*  CREATININE 0.93 0.89 0.88    Estimated Creatinine Clearance: 64.9 mL/min (by C-G formula based on SCr of 0.88 mg/dL).  Medications:  Per med history patient does NOT take 2.5 mg tablets with dosing of 1 1/4 tablet (3.125 mg) daily except 1 tablet (2.5 mg) on Friday, but is instead on 2mg  daily with dose on 11/29 held per MD (no reason given).   Assessment: 85 y/o M from SNF where he was recovering post-surgery admitted with respiratory failure due to SARS-CoV-2 disease. Pateint has a h/o CAF on warfarin.   -INR remains above goal today at 3.5 - warfarin held x 4 days, no bleeding noted   Goal of Therapy:  INR 2-3   Plan:   -Hold warfarin again today  -Daily INR  Anette Guarneri, PharmD Please see amion for complete clinical pharmacist phone list 06/07/2019 8:24 AM

## 2019-06-07 NOTE — Plan of Care (Signed)
A/O no noted distress. Denies pain, but when assess his BLE facial exhibit pain with moving the LE. Skin is very dry and flaky, administered moisturizer on skin.   Problem: Education: Goal: Knowledge of risk factors and measures for prevention of condition will improve Outcome: Progressing   Problem: Coping: Goal: Psychosocial and spiritual needs will be supported Outcome: Progressing   Problem: Respiratory: Goal: Will maintain a patent airway Outcome: Progressing Goal: Complications related to the disease process, condition or treatment will be avoided or minimized Outcome: Progressing

## 2019-06-07 NOTE — Progress Notes (Signed)
Update phone call given to patient spouse, [name]. Informed that patient has been transferred to room 306.

## 2019-06-08 ENCOUNTER — Inpatient Hospital Stay (HOSPITAL_COMMUNITY): Payer: Medicare Other

## 2019-06-08 LAB — PROTIME-INR
INR: 2.9 — ABNORMAL HIGH (ref 0.8–1.2)
Prothrombin Time: 29.9 seconds — ABNORMAL HIGH (ref 11.4–15.2)

## 2019-06-08 MED ORDER — ACETAMINOPHEN 325 MG PO TABS: 650.0000 mg | ORAL_TABLET | Freq: Four times a day (QID) | ORAL | Status: AC | PRN

## 2019-06-08 MED ORDER — DEXAMETHASONE 6 MG PO TABS: 6.0000 mg | ORAL_TABLET | Freq: Every day | ORAL | Status: AC

## 2019-06-08 MED ORDER — WARFARIN SODIUM 1 MG PO TABS: 1.0000 mg | ORAL_TABLET | Freq: Every day | ORAL | Status: DC

## 2019-06-08 MED ORDER — WARFARIN SODIUM 1 MG PO TABS
1.0000 mg | ORAL_TABLET | Freq: Once | ORAL | Status: AC
  Administered 2019-06-08: 1 mg via ORAL
  Filled 2019-06-08: qty 1

## 2019-06-08 MED ORDER — PANTOPRAZOLE SODIUM 40 MG PO TBEC: 40.0000 mg | DELAYED_RELEASE_TABLET | Freq: Every day | ORAL | Status: AC

## 2019-06-08 MED ORDER — DOCUSATE SODIUM 100 MG PO CAPS: 100.0000 mg | ORAL_CAPSULE | Freq: Every day | ORAL | 0 refills | Status: DC

## 2019-06-08 MED ORDER — POTASSIUM CHLORIDE CRYS ER 20 MEQ PO TBCR: 20.0000 meq | EXTENDED_RELEASE_TABLET | Freq: Every day | ORAL | Status: DC

## 2019-06-08 NOTE — Progress Notes (Signed)
Physical Therapy Treatment Patient Details Name: Olajuwon Fosdick MRN: [id number] DOB: [birth year] Today's Date: 06/08/2019    History of Present Illness 85 year old with a history of lung cancer status post resection, chronic atrial fibrillation, COPD requiring 2 L nasal cannula O2 support, GERD, and HTN who presented as a transfer from [place] where he presented with worsening hypoxic respiratory failure due to Covid.  He was previously admitted 11/6 > 11/12 with a right proximal tibia and left distal femur fracture following a traumatic accident, S/P fasciotomy with closure.Marland Kitchen  He was able to be discharged to a rehab center where he was progressing until he began to develop shortness of breath.  In the ED a CXR noted multifocal pulmonary infiltrates.Dx with COVID PNA, acute on chronic hypoic respiratory failure.     PT Comments    The patient's voice is a little less hoarse, appears agitated, asking multiple times when he is leaving to go to rehab. Patient is mobilizing much better, able to stand from bed and take a few hopps to recliner, NB on the LLE. Patient's SPO2 on RA 93% after several minutes to get probe to register( in low 80's on dynamap but not avertly SOB)  Continue  PT for mobility.  Follow Up Recommendations  SNF;Supervision/Assistance - 24 hour     Equipment Recommendations  None recommended by PT    Recommendations for Other Services       Precautions / Restrictions Precautions Precautions: Fall Restrictions Weight Bearing Restrictions: Yes RLE Weight Bearing: Weight bearing as tolerated LLE Weight Bearing: Touchdown weight bearing    Mobility  Bed Mobility Overal bed mobility: Needs Assistance Bed Mobility: Supine to Sit     Supine to sit: Min guard     General bed mobility comments: Min assist mostly at his trunk to come to fully upright sitting.  He was able to get both of his legs over the side of the bed and come half way to sitting using the  bedrail before I had to assist.   Transfers Overall transfer level: Needs assistance Equipment used: Rolling walker (2 wheeled) Transfers: Sit to/from [facility] Sit to Stand: Min guard Stand pivot transfers: Min assist       General transfer comment: Pt stood, able to take several hops with NWB on LLE to reclinerusing RW. Noted improved UE strength this date.  Ambulation/Gait                 Stairs             Wheelchair Mobility    Modified Rankin (Stroke Patients Only)       Balance Overall balance assessment: Needs assistance Sitting-balance support: Feet supported;Bilateral upper extremity supported Sitting balance-Leahy Scale: Fair Sitting balance - Comments: Pt sat EOB with min guard assist prior to stand pivot   Standing balance support: Bilateral upper extremity supported Standing balance-Leahy Scale: Poor                              Cognition Arousal/Alertness: Awake/alert Behavior During Therapy: Agitated Overall Cognitive Status: Within Functional Limits for tasks assessed                   Orientation Level: Time;Situation             General Comments: pt kept asking when he was going. On phone with family member and was agitated, appeared due to being here and not  rehab.Pt is limited by his very impaired hearing. has a dry erase communication board to help improve his ability to understand .      Exercises Other Exercises Other Exercises: Incentive spirometer x 10 with min cues on technique. Pulls 1210mL Other Exercises: Flutter valve x 10    General Comments General comments (skin integrity, edema, etc.): Pt on 2L [place] with O2 SATs in 90s following activity. Pt demo good follow through of BLE weight bearing restrictions. Pt demo good recall of breathing exercises.      Pertinent Vitals/Pain Pain Assessment: No/denies pain    Home Living                      Prior Function             PT Goals (current goals can now be found in the care plan section) Progress towards PT goals: Progressing toward goals    Frequency    Min 2X/week      PT Plan Current plan remains appropriate    Co-evaluation PT/OT/SLP Co-Evaluation/Treatment: Yes Reason for Co-Treatment: For patient/therapist safety PT goals addressed during session: Mobility/safety with mobility OT goals addressed during session: ADL's and self-care      AM-PAC PT "6 Clicks" Mobility   Outcome Measure  Help needed turning from your back to your side while in a flat bed without using bedrails?: None Help needed moving from lying on your back to sitting on the side of a flat bed without using bedrails?: A Little Help needed moving to and from a bed to a chair (including a wheelchair)?: A Little Help needed standing up from a chair using your arms (e.g., wheelchair or bedside chair)?: A Lot Help needed to walk in hospital room?: Total Help needed climbing 3-5 steps with a railing? : Total 6 Click Score: 14    End of Session Equipment Utilized During Treatment: Gait belt Activity Tolerance: Patient tolerated treatment well Patient left: in chair;with call bell/phone within reach;with chair alarm set Nurse Communication: Mobility status;Other (comment) PT Visit Diagnosis: Muscle weakness (generalized) (M62.81)     Time: 0826-0900 PT Time Calculation (min) (ACUTE ONLY): 34 min  Charges:  $Therapeutic Activity: 8-22 mins                     Tresa Endo PT Acute Rehabilitation Services Pager [phone] Office [phone]    Claretha Cooper 06/08/2019, 10:31 AM

## 2019-06-08 NOTE — Discharge Planning (Signed)
IV removed - RN assessment and VS revealed stability for DC to SNF.  Report called and s/w BJ Beckon, LPN at [facility].  PTAR contacted to transport -Waiting on arrival.  Wife informed of discharge.  Packet ready and will be sent to facility.

## 2019-06-08 NOTE — TOC Transition Note (Signed)
Transition of Care [facility]) - CM/SW Discharge Note   Patient Details  Name: Jerome Newman MRN: [id number] Date of Birth: [birth year]  Transition of Care [facility]) CM/SW Contact:  Weston Anna, LCSW Phone Number: 06/08/2019, 3:24 PM   Clinical Narrative:     Patient set to discharge to [facility] today 12/7. Please call report to [phone]. PTAR scheduled for 5:30PM. Spouse Ann notified of discharge- no concerns.   Final next level of care: Skilled Nursing Facility Barriers to Discharge: No Barriers Identified   Patient Goals and CMS Choice Patient states their goals for this hospitalization and ongoing recovery are:: get up on his own CMS Medicare.gov Compare Post Acute Care list provided to:: Patient Represenative (must comment) Choice offered to / list presented to : Spouse  Discharge Placement              Patient chooses bed at: Other - please specify in the comment section below:([facility]) Patient to be transferred to facility by: [facility] Name of family member notified: spouse- Ann Patient and family notified of of transfer: 06/08/19  Discharge Plan and Services     Post Acute Care Choice: [place]                               Social Determinants of Health (SDOH) Interventions     Readmission Risk Interventions No flowsheet data found.

## 2019-06-08 NOTE — Discharge Summary (Signed)
DISCHARGE SUMMARY  Jerome Newman  MR#: [id number]  DOB:1929/12/07  Date of Admission: 06/02/2019 Date of Discharge: 06/08/2019  Attending Physician:Jeffrey Hennie Duos, MD  Patient's SWF:UXNATFTD, Marshall Cork., MD  Consults: none   Disposition: D/C to SNF for rehab stay   Date of Positive COVID Test: 05/24/2019  COVID-19 specific Treatment: Remdesivir 11/30 > 12/4 Decadron 11/30 > 12/10  Follow-up Appts: Follow-up Information    [place], Marshall Cork., MD Follow up in 1 week(s).   Specialty: Family Medicine Contact information: [address] [place] [address] [phone]           Tests Needing Follow-up: - will need f/u appointment arranged w/ Dr. 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  He was previously admitted 11/6 > 11/12 with a right proximal tibia and left distal femur fracture following a traumatic accident.  He was able to be discharged to a rehab center where he was progressing until he began to develop shortness of breath.  In the ED a CXR noted multifocal pulmonary infiltrates.  Hospital Course: 11/6-11/12 traumatic lower extremity fractures requiring surgical  intervention/hospitalization 11/22 Covid positive test at rehab facility 11/30 sent to [place] ED with worsening shortness of breath 12/1 transfer to [facility] at family request 12/7 D/C to rehab facility   Covid pneumonia - acute on chronic hypoxic respiratory failure Has completed a course of remdesivir - continue Decadron to complete 10 days of treatment - refused convalescent plasma initially and then improved to the point that further therapies were not indicated - patient has been titrated back to his baseline 2 L nasal cannula oxygen support and remains stable   COPD with chronic hypoxic respiratory failure No active wheezing - baseline oxygen support required is 2 L nasal cannula  Recent trauma with multiple lower extremity fractures status post surgery 11/6 and 11/8 Surgery performed by Dr. Doreatha Martin - sutures removed 12/1 -wounds appear completely stable/healed - discussed w/ Ortho Team who report we should continue the following suggestions for another 2 weeks: WBAT RLE for transfers only, TDWB LLE - follow up Xrays of the legs obtained prior to leaving [place] to be evaluated by Ortho remotely - will need f/u appointment arranged w/ Dr. Doreatha Martin in 7-10 days   Chronic diastolic CHF No gross volume overload on physical exam - net negative approximately 6800 cc since admission - stable clincally   Mild hypokalemia Corrected with supplementation - check intermittently  Mild transaminitis Likely due to Covid and remdesivir - trending downward and nearly resolved at time of d/c   HLD Continue usual home medical therapy  Chronic atrial fibrillation status post AV nodal ablation 2010 with BiV PPM On chronic warfarin which was being dosed by pharmacy - return to usual home dose at time of d/c w/  need for ongoing monitoring of INR   Supratherapeutic INR Warfarin dosed by pharmacy during this admit   Stage II NSCLC Status post right upper lobe resection 2015  BPH  Asymptomatic during this admit   GERD  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 What changed:   medication strength  how much to take  when to take this  reasons to take this   benzonatate 100 MG capsule Commonly known as: TESSALON Take 100 mg by mouth 2 (two) times daily.   dexamethasone 6 MG tablet Commonly known as: DECADRON Take 1 tablet (6 mg total) by mouth daily for 3 days. Start taking on: June 09, 2019   dextromethorphan-guaiFENesin 10-100 MG/5ML liquid Commonly known as: ROBITUSSIN-DM Take 10 mLs by mouth 3 (three) times daily.   docusate sodium 100 MG capsule Commonly known as: COLACE Take 1 capsule (100 mg total) by mouth daily. Start taking on: June 09, 2019   HYDROcodone-acetaminophen 5-325 MG tablet Commonly known as: NORCO/VICODIN Take 1 tablet by mouth every 6 (six) hours as needed for severe pain.   iron polysaccharides 150 MG capsule Commonly known as: NIFEREX Take 1 capsule (150 mg total) by mouth daily.   OXYGEN Inhale 2 L/min into the lungs at bedtime.   pantoprazole 40 MG tablet Commonly known as: PROTONIX Take 1 tablet (40 mg total) by mouth daily. Start taking on: June 09, 2019 What changed: when to take this   potassium chloride SA 20 MEQ tablet Commonly known as: KLOR-CON Take 1 tablet (20 mEq total) by mouth daily. Start taking on: June 09, 2019   solifenacin 5 MG  tablet Commonly known as: VESICARE Take 5 mg by mouth daily.   Systane Preservative Free 0.4-0.3 % Soln Generic drug: Polyethyl Glyc-Propyl Glyc PF Place 2 drops into both eyes every 6 (six) hours as needed (for dryness).   torsemide 10 MG tablet Commonly known as: DEMADEX TAKE 1 TABLET BY MOUTH ONCE DAILY   vitamin C 500 MG tablet Commonly known as: ASCORBIC ACID Take 1,000 mg by mouth daily.   Vitamin D3 25 MCG (1000 UT) Caps Take 2,000 Units by mouth every 12 (twelve) hours.   warfarin 1 MG tablet Commonly known as: COUMADIN Take as directed. If you are unsure how to take this medication, talk to your nurse or doctor. Original instructions: Take 1 tablet (1 mg total) by mouth daily at 6 PM. What changed:   medication strength  how much to take   zinc sulfate 220 (50 Zn) MG capsule Take 220 mg by mouth daily. For 14 days Course started on 11.23.20       Day of Discharge BP 128/68 (BP Location: Left Arm)   Pulse 79   Temp 97.8 F (36.6 C) (Oral)   Resp 20   Ht 6' (1.829 m)   Wt 78.8 kg  SpO2 96%   BMI 23.56 kg/m   Physical Exam: General: No acute respiratory distress Lungs: Clear to auscultation bilaterally without wheezes or crackles Cardiovascular: Regular rate and rhythm without murmur gallop or rub normal S1 and S2 Abdomen: Nontender, nondistended, soft, bowel sounds positive, no rebound, no ascites, no appreciable mass Extremities: No significant cyanosis, clubbing, or edema bilateral lower extremities - surgical wounds all dry and w/o erythema   Basic Metabolic Panel: Recent Labs  Lab 06/03/19 0210 06/04/19 0205 06/05/19 0155 06/06/19 0147 06/07/19 0135  NA 138 140 138 142 141  K 4.1 3.4* 3.4* 4.0 4.2  CL 98 101 99 100 99  CO2 30 28 26 31 29   GLUCOSE 105* 114* 97 101* 101*  BUN 33* 36* 36* 37* 38*  CREATININE 0.84 0.88 0.93 0.89 0.88  CALCIUM 9.1 9.0 8.8* 9.2 9.1  MG  --   --  1.9  --   --     Liver Function Tests: Recent Labs  Lab  06/03/19 0210 06/04/19 0205 06/05/19 0155 06/06/19 0147 06/07/19 0135  AST 32 57* 83* 48* 31  ALT 22 46* 101* 86* 62*  ALKPHOS 99 102 105 113 103  BILITOT 1.1 1.3* 1.3* 1.3* 1.3*  PROT 6.3* 6.0* 5.9* 6.2* 5.9*  ALBUMIN 2.7* 2.7* 2.6* 2.6* 2.5*   Coags: Recent Labs  Lab 06/04/19 0205 06/05/19 0155 06/06/19 0147 06/07/19 0135 06/08/19 0120  INR 3.3* 4.1* 3.7* 3.5* 2.9*    CBC: Recent Labs  Lab 06/03/19 0210 06/04/19 0205 06/05/19 0155 06/06/19 0147 06/07/19 0135  WBC 8.3 8.6 10.4 9.6 12.0*  NEUTROABS 7.7 7.8* 8.9* 8.3* 10.6*  HGB 11.3* 11.4* 12.0* 12.2* 11.7*  HCT 35.5* 36.4* 36.9* 38.0* 37.0*  MCV 93.9 93.1 91.3 91.8 93.0  PLT 243 280 275 259 252    Recent Results (from the past 240 hour(s))  MRSA PCR Screening     Status: Abnormal   Collection Time: 06/02/19  4:57 AM   Specimen: Nasopharyngeal Wash  Result Value Ref Range Status   MRSA by PCR POSITIVE (A) NEGATIVE Final    Comment:        The GeneXpert MRSA Assay (FDA approved for NASAL specimens only), is one component of a comprehensive MRSA colonization surveillance program. It is not intended to diagnose MRSA infection nor to guide or monitor treatment for MRSA infections. Performed at [facility], [place] [address], [place], [place] [address]      Time spent in discharge (includes decision making & examination of pt): 35 minutes  06/08/2019, 3:19 PM   Cherene Altes, MD [facility] Office  [phone]

## 2019-06-08 NOTE — Care Management Important Message (Signed)
Important Message  Patient Details  Name: Jerome Newman MRN: [id number] Date of Birth: 26-Aug-1929   Medicare Important Message Given:  Yes - Important Message mailed due to current National Emergency  Verbal consent obtained due to current National Emergency  Relationship to patient: Spouse/Significant Other Contact Name: Shariff Lasky Call Date: 06/08/19  Time: 1503 Phone: [phone] Outcome: Spoke with contact Important Message mailed to: Patient address on file    [place] 06/08/2019, 3:03 PM

## 2019-06-08 NOTE — Progress Notes (Signed)
[facility] for Warfarin Indication: atrial fibrillation  Allergies  Allergen Reactions  . Gabapentin Other (See Comments)    Drowsiness   . Nsaids Other (See Comments)    NOT SUPPOSED TO TAKE DUE TO BLOOD THINNERS    Patient Measurements: Height: 6' (182.9 cm) Weight: 173 lb 11.6 oz (78.8 kg) IBW/kg (Calculated) : 77.6  Vital Signs: Temp: 97.8 F (36.6 C) (12/07 0700) Temp Source: Oral (12/07 0700) BP: 128/68 (12/07 0700) Pulse Rate: 79 (12/07 0700)  Labs: Recent Labs    06/06/19 0147 06/07/19 0135 06/08/19 0120  HGB 12.2* 11.7*  --   HCT 38.0* 37.0*  --   PLT 259 252  --   LABPROT 36.9* 34.8* 29.9*  INR 3.7* 3.5* 2.9*  CREATININE 0.89 0.88  --     Estimated Creatinine Clearance: 64.9 mL/min (by C-G formula based on SCr of 0.88 mg/dL).  Medications:  Per med history patient does NOT take 2.5 mg tablets with dosing of 1 1/4 tablet (3.125 mg) daily except 1 tablet (2.5 mg) on Friday, but is instead on 2mg  daily with dose on 11/29 held per MD (no reason given).   Assessment: 85 y/o M from SNF where he was recovering post-surgery admitted with respiratory failure due to SARS-CoV-2 disease. Pateint has a h/o CAF on warfarin.   INR back in range today at 2.9 so we will resume at a low dose.    Goal of Therapy:  INR 2-3   Plan:   -Coumadin 1mg  PO x1 -Daily INR  Onnie Boer, PharmD, [place], AAHIVP, CPP Infectious Disease Pharmacist 06/08/2019 1:02 PM

## 2019-06-08 NOTE — Progress Notes (Signed)
Occupational Therapy Treatment Patient Details Name: Jerome Newman MRN: [id number] DOB: [birth year] Today's Date: 06/08/2019    History of present illness 85 year old with a history of lung cancer status post resection, chronic atrial fibrillation, COPD requiring 2 L nasal cannula O2 support, GERD, and HTN who presented as a transfer from [place] where he presented with worsening hypoxic respiratory failure due to Covid.  He was previously admitted 11/6 > 11/12 with a right proximal tibia and left distal femur fracture following a traumatic accident, S/P fasciotomy with closure.Marland Kitchen  He was able to be discharged to a rehab center where he was progressing until he began to develop shortness of breath.  In the ED a CXR noted multifocal pulmonary infiltrates.Dx with COVID PNA, acute on chronic hypoic respiratory failure.    OT comments  Pt progressing in therapy, demonstrating improved activity tolerance and independence with transfer tasks this date. Pt tolerated sitting EOB ~10 min with supervision, noting 0 instances of LOB. Pt able to transfer to bedside chair with RW and min guard, noting 0 instances of LOB. Pt demo good UE strength to hop and maintain TWB LLE. Pt continues to require 2L [place] with O2 SATs in 90s following activity. 2/4 DOE. Pt continues to demonstrate decreased strength, endurance, balance, standing tolerance, activity tolerance, and safety awareness impacting ability to complete self-care and functional transfer tasks. Recommend continued skilled OT services to address above deficits in order to promote function and prevent further decline. Recommend SNF placement for additional rehab prior to discharge home.   Follow Up Recommendations  SNF    Equipment Recommendations  Other (comment)(TBD at next venue of care)    Recommendations for Other Services      Precautions / Restrictions Precautions Precautions: Fall Restrictions Weight Bearing Restrictions: Yes RLE  Weight Bearing: Weight bearing as tolerated(for transfers only) LLE Weight Bearing: Touchdown weight bearing       Mobility Bed Mobility Overal bed mobility: Needs Assistance Bed Mobility: Supine to Sit     Supine to sit: Min guard(HOB flat)        Transfers Overall transfer level: Needs assistance Equipment used: Rolling walker (2 wheeled) Transfers: Sit to/from [facility] Sit to Stand: Min guard Stand pivot transfers: Min guard       General transfer comment: Pt stood, able to hop over to chair with RW. Noted improved UE strength this date.    Balance Overall balance assessment: Needs assistance   Sitting balance-Leahy Scale: Fair       Standing balance-Leahy Scale: Poor                             ADL either performed or assessed with clinical judgement   ADL Overall ADL's : Needs assistance/impaired     Grooming: Set up;Sitting               Lower Body Dressing: Total assistance               Functional mobility during ADLs: Minimal assistance;Rolling walker;Min guard       Vision       Perception     Praxis      Cognition Arousal/Alertness: Awake/alert Behavior During Therapy: Agitated(Pt frustrated that he has not discharged from the hospital) Overall Cognitive Status: Within Functional Limits for tasks assessed  Exercises Exercises: Other exercises Other Exercises Other Exercises: Incentive spirometer x 10 with min cues on technique. Pulls 1275mL Other Exercises: Flutter valve x 10   Shoulder Instructions       General Comments Pt on 2L [place] with O2 SATs in 90s following activity. Pt demo good follow through of BLE weight bearing restrictions. Pt demo good recall of breathing exercises.    Pertinent Vitals/ Pain       Pain Assessment: No/denies pain  Home Living                                          Prior  Functioning/Environment              Frequency           Progress Toward Goals  OT Goals(current goals can now be found in the care plan section)  Progress towards OT goals: Progressing toward goals  ADL Goals Pt Will Perform Grooming: with set-up;sitting Pt Will Perform Upper Body Bathing: with min guard assist;sitting Pt Will Perform Lower Body Bathing: with max assist;with mod assist;sitting/lateral leans Pt Will Perform Upper Body Dressing: with min guard assist;sitting Pt Will Perform Lower Body Dressing: with mod assist;with adaptive equipment;sitting/lateral leans;sit to/from stand Pt Will Transfer to Toilet: with mod assist;bedside commode Pt Will Perform Toileting - Clothing Manipulation and hygiene: with mod assist;sitting/lateral leans;sit to/from stand Additional ADL Goal #1: Pt to recall and demonstrate breathing exercises with 0 verbal cues on technique.  Plan Discharge plan remains appropriate    Co-evaluation    PT/OT/SLP Co-Evaluation/Treatment: Yes Reason for Co-Treatment: For patient/therapist safety;To address functional/ADL transfers   OT goals addressed during session: ADL's and self-care      AM-PAC OT "6 Clicks" Daily Activity     Outcome Measure   Help from another person eating meals?: None Help from another person taking care of personal grooming?: A Little Help from another person toileting, which includes using toliet, bedpan, or urinal?: A Lot Help from another person bathing (including washing, rinsing, drying)?: A Lot Help from another person to put on and taking off regular upper body clothing?: A Little Help from another person to put on and taking off regular lower body clothing?: Total 6 Click Score: 15    End of Session Equipment Utilized During Treatment: Oxygen;Gait belt  OT Visit Diagnosis: Unsteadiness on feet (R26.81);Muscle weakness (generalized) (M62.81)   Activity Tolerance Patient tolerated treatment well   Patient  Left in chair;with call bell/phone within reach;with chair alarm set   Nurse Communication Mobility status        Time: 5003-7048 OT Time Calculation (min): 27 min  Charges: OT General Charges $OT Visit: 1 Visit OT Treatments $Therapeutic Activity: 8-22 mins  Mauri Brooklyn OTR/L [phone]    Mauri Brooklyn 06/08/2019, 9:55 AM

## 2019-06-08 NOTE — Plan of Care (Signed)
Spoke with wife to discuss todays POC and patient status.  Discussed how his mobility has improved and looks like will be getting back to Rehab today.  PTAR currently set to arrive about 1730.

## 2019-06-08 NOTE — Discharge Instructions (Signed)

## 2019-06-11 NOTE — Telephone Encounter (Signed)
Patient to be discharged home from SNF on Dec/14/2020.  Will contact [facility] to order INR check once service provider know.  Order for [employer] POC machine faxed to [facility] for Dr Bettina Gavia to sign.

## 2019-06-17 ENCOUNTER — Telehealth: Payer: Self-pay | Admitting: *Deleted

## 2019-06-17 ENCOUNTER — Ambulatory Visit (INDEPENDENT_AMBULATORY_CARE_PROVIDER_SITE_OTHER): Payer: Medicare Other | Admitting: Cardiology

## 2019-06-17 DIAGNOSIS — Z5181 Encounter for therapeutic drug level monitoring: Secondary | ICD-10-CM

## 2019-06-17 DIAGNOSIS — Z7901 Long term (current) use of anticoagulants: Secondary | ICD-10-CM

## 2019-06-17 DIAGNOSIS — I482 Chronic atrial fibrillation, unspecified: Secondary | ICD-10-CM

## 2019-06-17 DIAGNOSIS — I2699 Other pulmonary embolism without acute cor pulmonale: Secondary | ICD-10-CM

## 2019-06-17 LAB — POCT INR: INR: 2.8 (ref 2.0–3.0)

## 2019-06-17 NOTE — Telephone Encounter (Signed)
Created an inr encounter so the pharmd can dose they will complete asap

## 2019-06-17 NOTE — Telephone Encounter (Signed)
Gwen from Dr. Clement Sayres office called trying to figure out how to direct the patient's home health RN with his INR results from today. Patient's INR was 2.8 today. Please call home health RN, Alyse Low, at [phone] for coumadin dosing instructions. Thanks!

## 2019-06-22 ENCOUNTER — Other Ambulatory Visit: Payer: Self-pay | Admitting: Cardiology

## 2019-06-24 ENCOUNTER — Ambulatory Visit (INDEPENDENT_AMBULATORY_CARE_PROVIDER_SITE_OTHER): Payer: Medicare Other | Admitting: Pharmacist Clinician (PhC)/ Clinical Pharmacy Specialist

## 2019-06-24 DIAGNOSIS — Z5181 Encounter for therapeutic drug level monitoring: Secondary | ICD-10-CM | POA: Diagnosis not present

## 2019-06-24 DIAGNOSIS — I482 Chronic atrial fibrillation, unspecified: Secondary | ICD-10-CM

## 2019-06-24 DIAGNOSIS — I2699 Other pulmonary embolism without acute cor pulmonale: Secondary | ICD-10-CM | POA: Diagnosis not present

## 2019-06-24 DIAGNOSIS — Z7901 Long term (current) use of anticoagulants: Secondary | ICD-10-CM

## 2019-06-24 LAB — POCT INR: INR: 4.4 — AB (ref 2.0–3.0)

## 2019-06-29 ENCOUNTER — Telehealth: Payer: Self-pay

## 2019-06-29 NOTE — Telephone Encounter (Signed)
Jose w/[employer] called needing some more information regarding this pt. Will route to pharmd pool

## 2019-06-30 NOTE — Telephone Encounter (Signed)
[facility] and left voicemail to return call

## 2019-07-01 ENCOUNTER — Ambulatory Visit (INDEPENDENT_AMBULATORY_CARE_PROVIDER_SITE_OTHER): Payer: Medicare Other | Admitting: Cardiovascular Disease

## 2019-07-01 DIAGNOSIS — I2699 Other pulmonary embolism without acute cor pulmonale: Secondary | ICD-10-CM | POA: Diagnosis not present

## 2019-07-01 DIAGNOSIS — I482 Chronic atrial fibrillation, unspecified: Secondary | ICD-10-CM

## 2019-07-01 DIAGNOSIS — Z7901 Long term (current) use of anticoagulants: Secondary | ICD-10-CM | POA: Diagnosis not present

## 2019-07-01 DIAGNOSIS — Z5181 Encounter for therapeutic drug level monitoring: Secondary | ICD-10-CM

## 2019-07-01 LAB — POCT INR: INR: 3.6 — AB (ref 2.0–3.0)

## 2019-07-01 NOTE — Telephone Encounter (Signed)
Talked Jose from [employer]. Insurance information clarified

## 2019-07-07 ENCOUNTER — Other Ambulatory Visit: Payer: Self-pay

## 2019-07-08 ENCOUNTER — Ambulatory Visit (INDEPENDENT_AMBULATORY_CARE_PROVIDER_SITE_OTHER): Payer: Medicare Other | Admitting: Cardiology

## 2019-07-08 DIAGNOSIS — Z5181 Encounter for therapeutic drug level monitoring: Secondary | ICD-10-CM | POA: Diagnosis not present

## 2019-07-08 DIAGNOSIS — I482 Chronic atrial fibrillation, unspecified: Secondary | ICD-10-CM

## 2019-07-08 DIAGNOSIS — I2699 Other pulmonary embolism without acute cor pulmonale: Secondary | ICD-10-CM | POA: Diagnosis not present

## 2019-07-08 DIAGNOSIS — Z7901 Long term (current) use of anticoagulants: Secondary | ICD-10-CM

## 2019-07-08 LAB — POCT INR: INR: 2.4 (ref 2.0–3.0)

## 2019-07-20 ENCOUNTER — Ambulatory Visit (INDEPENDENT_AMBULATORY_CARE_PROVIDER_SITE_OTHER): Payer: Medicare Other | Admitting: Cardiovascular Disease

## 2019-07-20 DIAGNOSIS — Z5181 Encounter for therapeutic drug level monitoring: Secondary | ICD-10-CM

## 2019-07-20 DIAGNOSIS — I2699 Other pulmonary embolism without acute cor pulmonale: Secondary | ICD-10-CM | POA: Diagnosis not present

## 2019-07-20 DIAGNOSIS — I482 Chronic atrial fibrillation, unspecified: Secondary | ICD-10-CM | POA: Diagnosis not present

## 2019-07-20 DIAGNOSIS — Z7901 Long term (current) use of anticoagulants: Secondary | ICD-10-CM

## 2019-07-20 LAB — POCT INR: INR: 2.7 (ref 2.0–3.0)

## 2019-07-23 ENCOUNTER — Other Ambulatory Visit: Payer: Self-pay | Admitting: Student

## 2019-07-23 DIAGNOSIS — S72462D Displaced supracondylar fracture with intracondylar extension of lower end of left femur, subsequent encounter for closed fracture with routine healing: Secondary | ICD-10-CM

## 2019-07-31 ENCOUNTER — Ambulatory Visit
Admission: RE | Admit: 2019-07-31 | Discharge: 2019-07-31 | Disposition: A | Discharge status: Home / Self Care | Payer: Medicare Other | Source: Ambulatory Visit | Attending: Student | Admitting: Student

## 2019-07-31 ENCOUNTER — Other Ambulatory Visit: Payer: Self-pay

## 2019-07-31 DIAGNOSIS — S72462D Displaced supracondylar fracture with intracondylar extension of lower end of left femur, subsequent encounter for closed fracture with routine healing: Secondary | ICD-10-CM

## 2019-08-03 LAB — POCT INR: INR: 3.9 — AB (ref 2.0–3.0)

## 2019-08-04 ENCOUNTER — Other Ambulatory Visit: Payer: Self-pay | Admitting: Student

## 2019-08-04 ENCOUNTER — Ambulatory Visit (INDEPENDENT_AMBULATORY_CARE_PROVIDER_SITE_OTHER): Payer: Medicare Other | Admitting: Pharmacist

## 2019-08-04 DIAGNOSIS — I2699 Other pulmonary embolism without acute cor pulmonale: Secondary | ICD-10-CM | POA: Diagnosis not present

## 2019-08-04 DIAGNOSIS — Z7901 Long term (current) use of anticoagulants: Secondary | ICD-10-CM

## 2019-08-05 ENCOUNTER — Encounter (INDEPENDENT_AMBULATORY_CARE_PROVIDER_SITE_OTHER): Payer: Medicare Other | Admitting: Cardiology

## 2019-08-05 DIAGNOSIS — I2699 Other pulmonary embolism without acute cor pulmonale: Secondary | ICD-10-CM

## 2019-08-05 DIAGNOSIS — Z7901 Long term (current) use of anticoagulants: Secondary | ICD-10-CM | POA: Diagnosis not present

## 2019-08-05 DIAGNOSIS — Z5181 Encounter for therapeutic drug level monitoring: Secondary | ICD-10-CM

## 2019-08-05 DIAGNOSIS — I482 Chronic atrial fibrillation, unspecified: Secondary | ICD-10-CM

## 2019-08-05 NOTE — Progress Notes (Signed)
This encounter was created in error - please disregard.

## 2019-08-17 ENCOUNTER — Ambulatory Visit (INDEPENDENT_AMBULATORY_CARE_PROVIDER_SITE_OTHER): Payer: Medicare Other | Admitting: Cardiovascular Disease

## 2019-08-17 DIAGNOSIS — I482 Chronic atrial fibrillation, unspecified: Secondary | ICD-10-CM | POA: Diagnosis not present

## 2019-08-17 DIAGNOSIS — I2699 Other pulmonary embolism without acute cor pulmonale: Secondary | ICD-10-CM | POA: Diagnosis not present

## 2019-08-17 DIAGNOSIS — Z5181 Encounter for therapeutic drug level monitoring: Secondary | ICD-10-CM

## 2019-08-17 DIAGNOSIS — Z7901 Long term (current) use of anticoagulants: Secondary | ICD-10-CM

## 2019-08-17 LAB — POCT INR: INR: 2.5 (ref 2.0–3.0)

## 2019-08-31 LAB — POCT INR: INR: 3.2 — AB (ref 2.0–3.0)

## 2019-09-01 ENCOUNTER — Ambulatory Visit (INDEPENDENT_AMBULATORY_CARE_PROVIDER_SITE_OTHER): Payer: Medicare Other | Admitting: Pharmacist

## 2019-09-01 DIAGNOSIS — I482 Chronic atrial fibrillation, unspecified: Secondary | ICD-10-CM | POA: Diagnosis not present

## 2019-09-01 DIAGNOSIS — Z7901 Long term (current) use of anticoagulants: Secondary | ICD-10-CM | POA: Diagnosis not present

## 2019-09-04 ENCOUNTER — Other Ambulatory Visit: Payer: Self-pay | Admitting: Cardiology

## 2019-09-14 ENCOUNTER — Ambulatory Visit (INDEPENDENT_AMBULATORY_CARE_PROVIDER_SITE_OTHER): Payer: Medicare Other | Admitting: Cardiovascular Disease

## 2019-09-14 DIAGNOSIS — I2699 Other pulmonary embolism without acute cor pulmonale: Secondary | ICD-10-CM

## 2019-09-14 DIAGNOSIS — Z5181 Encounter for therapeutic drug level monitoring: Secondary | ICD-10-CM

## 2019-09-14 DIAGNOSIS — Z7901 Long term (current) use of anticoagulants: Secondary | ICD-10-CM | POA: Diagnosis not present

## 2019-09-14 DIAGNOSIS — I482 Chronic atrial fibrillation, unspecified: Secondary | ICD-10-CM

## 2019-09-14 LAB — POCT INR: INR: 3.3 — AB (ref 2.0–3.0)

## 2019-09-27 NOTE — Progress Notes (Signed)
Cardiology Office Note:    Date:  09/28/2019   ID:  Jerome Newman, DOB [birth year], MRN [id number]  PCP:  [name]., MD  Cardiologist:  Shirlee More, MD    Referring MD: [name]., MD    ASSESSMENT:    1. Chronic diastolic congestive heart failure ([place])   2. Chronic atrial fibrillation (HCC)   3. Chronic anticoagulation   4. S/P AV nodal ablation   5. Status post biventricular pacemaker   6. Shortness of breath    PLAN:    In order of problems listed above:  1. His heart failure is mildly decompensated will increase his diuretic every other day recheck renal function proBNP. 2. Rate is controlled after AV nodal ablation permanently paced can continue his warfarin anticoagulation goal INR is 2.5.  Device is followed at [facility] normal parameters 3. His wife alerts me that he may require repeat surgery to remove hardware but I would be  hesitant about a total knee arthroplasty in view of his overall health comorbidities and significant COPD   Next appointment: 6 months   Medication Adjustments/Labs and Tests Ordered: Current medicines are reviewed at length with the patient today.  Concerns regarding medicines are outlined above.  Orders Placed This Encounter  Procedures  . Basic Metabolic Panel (BMET)  . Pro b natriuretic peptide   Meds ordered this encounter  Medications  . torsemide (DEMADEX) 10 MG tablet    Sig: Take 2 tablets (20 mg total) by mouth every other day.    Dispense:  60 tablet    Refill:  1    Patient needs office visit for further refills.    Chief Complaint  Patient presents with  . Follow-up  . Congestive Heart Failure    History of Present Illness:    Jerome Newman is a 86 y.o. male with a hx of lung Cancer, CHF, chronic Atrial Fibrillation, AVN ablation and BiV pacemaker, chronic anticoagulation for AF and previous remote DVT and PE  last seen 04/30/2019.    He was admitted to [facility] ED and [facility] COVID-19 ammonia 06/02/2019.Marland Kitchen He was treated with remdesivir Decadron and was discharged from the hospital 06/08/2019.  Compliance with diet, lifestyle and medications: Yes  His anticoagulation is followed by the anticoagulation clinic:  13 d ago  (09/14/19) 3 wk ago  (08/31/19) 1 mo ago  (08/17/19) 1 mo ago  (08/03/19) 2 mo ago  (07/20/19)   INR 2.0 - 3.0 3.3Abnormal   3.2Abnormal  CM  2.5  3.9Abnormal  CM  2.7    CMP 06/08/2019 showed a creatinine 0.8 potassium 4.2 bilirubin elevated 1.3 otherwise normal liver function test.  CBC had a white count of 12,000 hemoglobin 11.7.  Pacemaker is followed at [facility] EP plans lifestyle none 08/27/2019 showed a projected battery life 5.5 years stable parameters and function.  His COVID-19 occurred when he was in rehab after trauma to both lower extremities requiring ORIF of his lower extremities.  Is improved his usual shortness of breath he does note edema and his weight is up a few pounds no orthopnea chest pain palpitation or syncope.  His wife is present and participated in the interview especially incidence of profound hearing deficit and decision making. Past Medical History:  Diagnosis Date  . Asthma   . Atrial fibrillation ([place])   . BPH (benign prostatic hyperplasia)   . Cancer ([place])   . CHF (congestive heart  failure) ([place])   . Chronic atrial fibrillation ([place]) 12/31/2016   SP AVN ablation and BiV pacemaker  . COPD (chronic obstructive pulmonary disease) ([place])   . Diastolic congestive heart failure ([place]) 12/31/2016  . DVT (deep venous thrombosis) ([place]) 09/26/2013  . Dyspnea   . GERD (gastroesophageal reflux disease)   . Pancreatitis   . Pneumonia   . Presence of permanent cardiac pacemaker   . Pulmonary embolism ([place]) 08/10/2013    Past Surgical History:  Procedure Laterality Date  . AV NODE ABLATION  01/18/2016  . BI-VENTRICULAR PACEMAKER INSERTION (CRT-P)    . BLADDER SURGERY      Removed cancer [facility]  . CARDIOVERSION    . DRESSING CHANGE UNDER ANESTHESIA Left 05/10/2019   Procedure: Dressing Change Under Anesthesia;  Surgeon: Shona Needles, MD;  Location: [place];  Service: Orthopedics;  Laterality: Left;  . EXTERNAL FIXATION LEG Bilateral 05/08/2019   Procedure: EXTERNAL FIXATION LEG;  Surgeon: Shona Needles, MD;  Location: [place];  Service: Orthopedics;  Laterality: Bilateral;  . FASCIOTOMY Right 05/08/2019   Procedure: FASCIOTOMY;  Surgeon: Shona Needles, MD;  Location: [place];  Service: Orthopedics;  Laterality: Right;  . FRACTURE SURGERY    . HERNIA REPAIR    . INSERT / REPLACE / REMOVE PACEMAKER    . LUNG REMOVAL, PARTIAL Right   . ORIF TIBIA PLATEAU Right 05/10/2019   Procedure: OPEN REDUCTION INTERNAL FIXATION (ORIF) TIBIAL PLATEAU RIGHT;  Surgeon: Shona Needles, MD;  Location: [place];  Service: Orthopedics;  Laterality: Right;  . SECONDARY CLOSURE OF WOUND Right 05/10/2019   Procedure: SECONDARY CLOSURE OF WOUND RIGHT LEG;  Surgeon: Shona Needles, MD;  Location: [place];  Service: Orthopedics;  Laterality: Right;    Current Medications: Current Meds  Medication Sig  . acetaminophen (TYLENOL) 325 MG tablet Take 2 tablets (650 mg total) by mouth every 6 (six) hours as needed for mild pain or headache (fever >/= 101).  . meloxicam (MOBIC) 15 MG tablet Take 15 mg by mouth daily.  . OXYGEN Inhale 2 L/min into the lungs at bedtime.  . pantoprazole (PROTONIX) 40 MG tablet Take 1 tablet (40 mg total) by mouth daily.  Vladimir Faster Glyc-Propyl Glyc PF (SYSTANE PRESERVATIVE FREE) 0.4-0.3 % SOLN Place 2 drops into both eyes every 6 (six) hours as needed (for dryness).   . potassium chloride SA (KLOR-CON) 20 MEQ tablet TAKE 1 TABLET BY MOUTH ONCE DAILY  . vitamin C (ASCORBIC ACID) 500 MG tablet Take 1,000 mg by mouth daily.  Marland Kitchen warfarin (COUMADIN) 1 MG tablet Take 1 tablet (1 mg total) by mouth daily at 6 PM.     Allergies:   Gabapentin and Nsaids   Social History    Socioeconomic History  . Marital status: Married    Spouse name: Not on file  . Number of children: Not on file  . Years of education: Not on file  . Highest education level: Not on file  Occupational History  . Not on file  Tobacco Use  . Smoking status: Former Research scientist (life sciences)  . Smokeless tobacco: Never Used  Substance and Sexual Activity  . Alcohol use: Not Currently  . Drug use: Never  . Sexual activity: Not on file  Other Topics Concern  . Not on file  Social History Narrative  . Not on file   Social Determinants of Health   Financial Resource Strain: Low Risk   . Difficulty of Paying Living Expenses: Not hard at  all  Food Insecurity: Unknown  . Worried About Charity fundraiser in the Last Year: Never true  . Ran Out of Food in the Last Year: Not on file  Transportation Needs: No Transportation Needs  . Lack of Transportation (Medical): No  . Lack of Transportation (Non-Medical): No  Physical Activity:   . Days of Exercise per Week:   . Minutes of Exercise per Session:   Stress:   . Feeling of Stress :   Social Connections:   . Frequency of Communication with Friends and Family:   . Frequency of Social Gatherings with Friends and Family:   . Attends Religious Services:   . Active Member of Clubs or Organizations:   . Attends Archivist Meetings:   Marland Kitchen Marital Status:      Family History: The patient's family history includes Cancer in his sister; Heart disease in his father. ROS:   Please see the history of present illness.    All other systems reviewed and are negative.  EKGs/Labs/Other Studies Reviewed:    The following studies were reviewed today:    Recent Labs: 04/30/2019: TSH 1.450 06/02/2019: B Natriuretic Peptide 171.6 06/05/2019: Magnesium 1.9 06/07/2019: ALT 62; BUN 38; Creatinine, Ser 0.88; Hemoglobin 11.7; Platelets 252; Potassium 4.2; Sodium 141  Recent Lipid Panel    Component Value Date/Time   CHOL 118 08/30/2018 1718   TRIG 49  08/30/2018 1718   HDL 46 08/30/2018 1718   CHOLHDL 2.6 08/30/2018 1718   VLDL 10 08/30/2018 1718   LDLCALC 62 08/30/2018 1718    Physical Exam:    VS:  BP 130/72   Pulse 81   Temp 97.8 F (36.6 C)   Ht 6' (1.829 m)   Wt 183 lb 3.2 oz (83.1 kg)   SpO2 98%   BMI 24.85 kg/m     Wt Readings from Last 3 Encounters:  09/28/19 183 lb 3.2 oz (83.1 kg)  06/06/19 173 lb 11.6 oz (78.8 kg)  05/08/19 197 lb (89.4 kg)     GEN: He looks increasingly frail well nourished, well developed in no acute distress HEENT: Normal NECK: No JVD; No carotid bruits LYMPHATICS: No lymphadenopathy CARDIAC: S1 is variable some no murmurs, rubs, gallops RESPIRATORY:  Clear to auscultation without rales, wheezing or rhonchi  ABDOMEN: Soft, non-tender, non-distended MUSCULOSKELETAL: 2+ bilateral pitting lower extremity edema; No deformity  SKIN: Warm and dry NEUROLOGIC:  Alert and oriented x 3 PSYCHIATRIC:  Normal affect    Signed, Shirlee More, MD  09/28/2019 3:59 PM    [facility]

## 2019-09-28 ENCOUNTER — Other Ambulatory Visit: Payer: Self-pay

## 2019-09-28 ENCOUNTER — Encounter: Payer: Self-pay | Admitting: Cardiology

## 2019-09-28 ENCOUNTER — Ambulatory Visit (INDEPENDENT_AMBULATORY_CARE_PROVIDER_SITE_OTHER): Payer: Medicare Other | Admitting: Cardiology

## 2019-09-28 VITALS — BP 130/72 | HR 81 | Temp 97.8°F | Ht 72.0 in | Wt 183.2 lb

## 2019-09-28 DIAGNOSIS — Z9889 Other specified postprocedural states: Secondary | ICD-10-CM | POA: Diagnosis not present

## 2019-09-28 DIAGNOSIS — R0602 Shortness of breath: Secondary | ICD-10-CM

## 2019-09-28 DIAGNOSIS — I5032 Chronic diastolic (congestive) heart failure: Secondary | ICD-10-CM

## 2019-09-28 DIAGNOSIS — Z7901 Long term (current) use of anticoagulants: Secondary | ICD-10-CM | POA: Diagnosis not present

## 2019-09-28 DIAGNOSIS — I482 Chronic atrial fibrillation, unspecified: Secondary | ICD-10-CM | POA: Diagnosis not present

## 2019-09-28 DIAGNOSIS — Z950 Presence of cardiac pacemaker: Secondary | ICD-10-CM

## 2019-09-28 LAB — POCT INR: INR: 2.9 (ref 2.0–3.0)

## 2019-09-28 MED ORDER — TORSEMIDE 10 MG PO TABS: 20.0000 mg | ORAL_TABLET | ORAL | 1 refills | Status: DC

## 2019-09-28 NOTE — Patient Instructions (Signed)
Medication Instructions:  Your physician has recommended you make the following change in your medication:  INCREASE Torsemide. Take two tablets by mouth once every other day. *If you need a refill on your cardiac medications before your next appointment, please call your pharmacy*   Lab Work: Your physician recommends that you return for lab work in: TODAY BMP, ProBNP If you have labs (blood work) drawn today and your tests are completely normal, you will receive your results only by: Marland Kitchen MyChart Message (if you have MyChart) OR . A paper copy in the mail If you have any lab test that is abnormal or we need to change your treatment, we will call you to review the results.   Testing/Procedures: None   Follow-Up: At [facility], you and your health needs are our priority.  As part of our continuing mission to provide you with exceptional heart care, we have created designated Provider Care Teams.  These Care Teams include your primary Cardiologist (physician) and Advanced Practice Providers (APPs -  Physician Assistants and Nurse Practitioners) who all work together to provide you with the care you need, when you need it.  We recommend signing up for the patient portal called "MyChart".  Sign up information is provided on this After Visit Summary.  MyChart is used to connect with patients for Virtual Visits (Telemedicine).  Patients are able to view lab/test results, encounter notes, upcoming appointments, etc.  Non-urgent messages can be sent to your provider as well.   To learn more about what you can do with MyChart, go to [email or web address].    Your next appointment:   6 month(s)  The format for your next appointment:   In Person  Provider:   Shirlee More, MD   Other Instructions

## 2019-09-29 ENCOUNTER — Ambulatory Visit (INDEPENDENT_AMBULATORY_CARE_PROVIDER_SITE_OTHER): Payer: Medicare Other | Admitting: Pharmacist

## 2019-09-29 DIAGNOSIS — Z5181 Encounter for therapeutic drug level monitoring: Secondary | ICD-10-CM

## 2019-09-29 DIAGNOSIS — I2699 Other pulmonary embolism without acute cor pulmonale: Secondary | ICD-10-CM

## 2019-09-29 DIAGNOSIS — I482 Chronic atrial fibrillation, unspecified: Secondary | ICD-10-CM | POA: Diagnosis not present

## 2019-09-29 DIAGNOSIS — Z7901 Long term (current) use of anticoagulants: Secondary | ICD-10-CM

## 2019-09-29 LAB — BASIC METABOLIC PANEL
BUN/Creatinine Ratio: 19 (ref 10–24)
BUN: 20 mg/dL (ref 8–27)
CO2: 21 mmol/L (ref 20–29)
Calcium: 10.3 mg/dL — ABNORMAL HIGH (ref 8.6–10.2)
Chloride: 104 mmol/L (ref 96–106)
Creatinine, Ser: 1.08 mg/dL (ref 0.76–1.27)
GFR calc Af Amer: 71 mL/min/{1.73_m2} (ref >59)
GFR calc non Af Amer: 61 mL/min/{1.73_m2} (ref >59)
Glucose: 91 mg/dL (ref 65–99)
Potassium: 4.5 mmol/L (ref 3.5–5.2)
Sodium: 143 mmol/L (ref 134–144)

## 2019-09-29 LAB — PRO B NATRIURETIC PEPTIDE: NT-Pro BNP: 2239 pg/mL — ABNORMAL HIGH (ref 0–486)

## 2019-10-12 ENCOUNTER — Ambulatory Visit (INDEPENDENT_AMBULATORY_CARE_PROVIDER_SITE_OTHER): Payer: Medicare Other | Admitting: Internal Medicine

## 2019-10-12 DIAGNOSIS — Z5181 Encounter for therapeutic drug level monitoring: Secondary | ICD-10-CM | POA: Diagnosis not present

## 2019-10-12 DIAGNOSIS — I2699 Other pulmonary embolism without acute cor pulmonale: Secondary | ICD-10-CM | POA: Diagnosis not present

## 2019-10-12 DIAGNOSIS — Z7901 Long term (current) use of anticoagulants: Secondary | ICD-10-CM | POA: Diagnosis not present

## 2019-10-12 DIAGNOSIS — I482 Chronic atrial fibrillation, unspecified: Secondary | ICD-10-CM

## 2019-10-12 LAB — POCT INR: INR: 2.7 (ref 2.0–3.0)

## 2019-10-23 ENCOUNTER — Telehealth: Payer: Self-pay | Admitting: Cardiology

## 2019-10-23 MED ORDER — TORSEMIDE 10 MG PO TABS: ORAL_TABLET | ORAL | 3 refills | Status: DC

## 2019-10-23 NOTE — Addendum Note (Signed)
Addended by: Resa Miner I on: 10/23/2019 02:32 PM   Modules accepted: Orders

## 2019-10-23 NOTE — Telephone Encounter (Signed)
Continue as they are presently taking

## 2019-10-23 NOTE — Telephone Encounter (Signed)
New Message     Pt c/o medication issue:  1. Name of Medication: torsemide (DEMADEX) 10 MG tablet  2. How are you currently taking this medication (dosage and times per day)? Pts wife says the medication was changed for him to alternate the medication taking 1 tablet 1 day and the next day to take 2 tablets and then the following day go back to 1   3. Are you having a reaction (difficulty breathing--STAT)? No   4. What is your medication issue? Pts wife is calling and says the pharmacist says the pt needs to take 2 tablets every other day but she says she thought he was suppose to be alternating the medication     Please call

## 2019-10-23 NOTE — Telephone Encounter (Signed)
Spoke with the patients wife just now and let her know Dr. Joya Gaskins recommendation to continue taking it like they have been. I am sending in a new prescription for the patient as well.

## 2019-10-23 NOTE — Telephone Encounter (Signed)
Spoke with the patients wife. She states that they were told that the patient needs to take one tablet of his torsemide 10 mg on one day and then two tablets (20 mg) the next day and continue alternating. The prescription that was sent in states that he is suppose to take  2 tablets (20 mg) every other day. She is just wondering which is correct.   I am routing this to Dr. Bettina Gavia to review and advise.

## 2019-10-26 ENCOUNTER — Ambulatory Visit (INDEPENDENT_AMBULATORY_CARE_PROVIDER_SITE_OTHER): Payer: Medicare Other | Admitting: Pharmacist Clinician (PhC)/ Clinical Pharmacy Specialist

## 2019-10-26 DIAGNOSIS — Z5181 Encounter for therapeutic drug level monitoring: Secondary | ICD-10-CM

## 2019-10-26 DIAGNOSIS — Z7901 Long term (current) use of anticoagulants: Secondary | ICD-10-CM

## 2019-10-26 DIAGNOSIS — I2699 Other pulmonary embolism without acute cor pulmonale: Secondary | ICD-10-CM

## 2019-10-26 DIAGNOSIS — I482 Chronic atrial fibrillation, unspecified: Secondary | ICD-10-CM

## 2019-10-26 LAB — POCT INR: INR: 2.7 (ref 2.0–3.0)

## 2019-10-27 ENCOUNTER — Other Ambulatory Visit: Payer: Self-pay | Admitting: Cardiology

## 2019-11-09 ENCOUNTER — Ambulatory Visit (INDEPENDENT_AMBULATORY_CARE_PROVIDER_SITE_OTHER): Payer: Medicare Other | Admitting: Cardiovascular Disease

## 2019-11-09 DIAGNOSIS — I482 Chronic atrial fibrillation, unspecified: Secondary | ICD-10-CM | POA: Diagnosis not present

## 2019-11-09 DIAGNOSIS — I2699 Other pulmonary embolism without acute cor pulmonale: Secondary | ICD-10-CM | POA: Diagnosis not present

## 2019-11-09 DIAGNOSIS — Z5181 Encounter for therapeutic drug level monitoring: Secondary | ICD-10-CM

## 2019-11-09 DIAGNOSIS — Z7901 Long term (current) use of anticoagulants: Secondary | ICD-10-CM

## 2019-11-09 LAB — POCT INR: INR: 3.4 — AB (ref 2.0–3.0)

## 2019-11-23 ENCOUNTER — Ambulatory Visit (INDEPENDENT_AMBULATORY_CARE_PROVIDER_SITE_OTHER): Payer: Medicare Other | Admitting: Pharmacist

## 2019-11-23 DIAGNOSIS — Z7901 Long term (current) use of anticoagulants: Secondary | ICD-10-CM | POA: Diagnosis not present

## 2019-11-23 DIAGNOSIS — I482 Chronic atrial fibrillation, unspecified: Secondary | ICD-10-CM | POA: Diagnosis not present

## 2019-11-23 LAB — POCT INR: INR: 2.6 (ref 2.0–3.0)

## 2019-12-16 DIAGNOSIS — J9601 Acute respiratory failure with hypoxia: Secondary | ICD-10-CM | POA: Diagnosis not present

## 2019-12-16 DIAGNOSIS — I351 Nonrheumatic aortic (valve) insufficiency: Secondary | ICD-10-CM | POA: Diagnosis not present

## 2019-12-16 DIAGNOSIS — I361 Nonrheumatic tricuspid (valve) insufficiency: Secondary | ICD-10-CM

## 2019-12-17 DIAGNOSIS — J9601 Acute respiratory failure with hypoxia: Secondary | ICD-10-CM | POA: Diagnosis not present

## 2019-12-18 DIAGNOSIS — J449 Chronic obstructive pulmonary disease, unspecified: Secondary | ICD-10-CM

## 2019-12-18 DIAGNOSIS — Z7901 Long term (current) use of anticoagulants: Secondary | ICD-10-CM

## 2019-12-18 DIAGNOSIS — I482 Chronic atrial fibrillation, unspecified: Secondary | ICD-10-CM | POA: Diagnosis not present

## 2019-12-18 DIAGNOSIS — J9601 Acute respiratory failure with hypoxia: Secondary | ICD-10-CM | POA: Diagnosis not present

## 2019-12-18 DIAGNOSIS — R42 Dizziness and giddiness: Secondary | ICD-10-CM | POA: Diagnosis not present

## 2019-12-18 DIAGNOSIS — I5032 Chronic diastolic (congestive) heart failure: Secondary | ICD-10-CM | POA: Diagnosis not present

## 2019-12-19 DIAGNOSIS — Z950 Presence of cardiac pacemaker: Secondary | ICD-10-CM | POA: Diagnosis not present

## 2019-12-19 DIAGNOSIS — I5032 Chronic diastolic (congestive) heart failure: Secondary | ICD-10-CM | POA: Diagnosis not present

## 2019-12-19 DIAGNOSIS — J449 Chronic obstructive pulmonary disease, unspecified: Secondary | ICD-10-CM | POA: Diagnosis not present

## 2019-12-19 DIAGNOSIS — J9601 Acute respiratory failure with hypoxia: Secondary | ICD-10-CM | POA: Diagnosis not present

## 2019-12-19 DIAGNOSIS — I4891 Unspecified atrial fibrillation: Secondary | ICD-10-CM

## 2019-12-20 DIAGNOSIS — Z950 Presence of cardiac pacemaker: Secondary | ICD-10-CM | POA: Diagnosis not present

## 2019-12-20 DIAGNOSIS — J449 Chronic obstructive pulmonary disease, unspecified: Secondary | ICD-10-CM | POA: Diagnosis not present

## 2019-12-20 DIAGNOSIS — I5032 Chronic diastolic (congestive) heart failure: Secondary | ICD-10-CM | POA: Diagnosis not present

## 2019-12-20 DIAGNOSIS — I4891 Unspecified atrial fibrillation: Secondary | ICD-10-CM | POA: Diagnosis not present

## 2019-12-20 DIAGNOSIS — J9601 Acute respiratory failure with hypoxia: Secondary | ICD-10-CM | POA: Diagnosis not present

## 2019-12-21 DIAGNOSIS — I4891 Unspecified atrial fibrillation: Secondary | ICD-10-CM | POA: Diagnosis not present

## 2019-12-21 DIAGNOSIS — J449 Chronic obstructive pulmonary disease, unspecified: Secondary | ICD-10-CM | POA: Diagnosis not present

## 2019-12-21 DIAGNOSIS — I5032 Chronic diastolic (congestive) heart failure: Secondary | ICD-10-CM | POA: Diagnosis not present

## 2019-12-21 DIAGNOSIS — J9601 Acute respiratory failure with hypoxia: Secondary | ICD-10-CM | POA: Diagnosis not present

## 2019-12-21 DIAGNOSIS — Z950 Presence of cardiac pacemaker: Secondary | ICD-10-CM | POA: Diagnosis not present

## 2019-12-24 LAB — POCT INR: INR: 2 (ref 2.0–3.0)

## 2019-12-25 ENCOUNTER — Ambulatory Visit (INDEPENDENT_AMBULATORY_CARE_PROVIDER_SITE_OTHER): Payer: Medicare Other | Admitting: Pharmacist

## 2019-12-25 DIAGNOSIS — I482 Chronic atrial fibrillation, unspecified: Secondary | ICD-10-CM | POA: Diagnosis not present

## 2019-12-25 DIAGNOSIS — Z5181 Encounter for therapeutic drug level monitoring: Secondary | ICD-10-CM

## 2019-12-25 DIAGNOSIS — Z7901 Long term (current) use of anticoagulants: Secondary | ICD-10-CM | POA: Diagnosis not present

## 2020-01-08 ENCOUNTER — Ambulatory Visit (INDEPENDENT_AMBULATORY_CARE_PROVIDER_SITE_OTHER): Payer: Medicare Other | Admitting: Pharmacist

## 2020-01-08 DIAGNOSIS — I482 Chronic atrial fibrillation, unspecified: Secondary | ICD-10-CM

## 2020-01-08 DIAGNOSIS — Z5181 Encounter for therapeutic drug level monitoring: Secondary | ICD-10-CM

## 2020-01-08 DIAGNOSIS — Z7901 Long term (current) use of anticoagulants: Secondary | ICD-10-CM

## 2020-01-08 DIAGNOSIS — I2699 Other pulmonary embolism without acute cor pulmonale: Secondary | ICD-10-CM

## 2020-01-08 LAB — POCT INR: INR: 2.7 (ref 2.0–3.0)

## 2020-02-02 ENCOUNTER — Other Ambulatory Visit: Payer: Self-pay | Admitting: Cardiology

## 2020-02-02 MED ORDER — TORSEMIDE 10 MG PO TABS: ORAL_TABLET | ORAL | 3 refills | Status: DC

## 2020-02-02 NOTE — Telephone Encounter (Signed)
Refill sent in per request.  

## 2020-02-02 NOTE — Telephone Encounter (Signed)
*  STAT* If patient is at the pharmacy, call can be transferred to refill team.   1. Which medications need to be refilled? (please list name of each medication and dose if known) torsemide (DEMADEX) 10 MG tablet  2. Which pharmacy/location (including street and city if local pharmacy) is medication to be sent to? [place], [place]  3. Do they need a 30 day or 90 day supply? 90 day   Patient is out of medication

## 2020-02-09 ENCOUNTER — Telehealth: Payer: Self-pay

## 2020-02-09 ENCOUNTER — Ambulatory Visit: Payer: Self-pay | Admitting: Student

## 2020-02-09 DIAGNOSIS — S72462D Displaced supracondylar fracture with intracondylar extension of lower end of left femur, subsequent encounter for closed fracture with routine healing: Secondary | ICD-10-CM

## 2020-02-09 NOTE — Telephone Encounter (Signed)
lmom for overdue inr 

## 2020-02-12 MED ORDER — TORSEMIDE 20 MG PO TABS: 20.0000 mg | ORAL_TABLET | Freq: Every day | ORAL | 3 refills | Status: DC

## 2020-02-17 ENCOUNTER — Ambulatory Visit (INDEPENDENT_AMBULATORY_CARE_PROVIDER_SITE_OTHER): Payer: Medicare Other | Admitting: Cardiology

## 2020-02-17 DIAGNOSIS — Z7901 Long term (current) use of anticoagulants: Secondary | ICD-10-CM

## 2020-02-17 DIAGNOSIS — I482 Chronic atrial fibrillation, unspecified: Secondary | ICD-10-CM

## 2020-02-17 DIAGNOSIS — Z5181 Encounter for therapeutic drug level monitoring: Secondary | ICD-10-CM

## 2020-02-17 DIAGNOSIS — I2699 Other pulmonary embolism without acute cor pulmonale: Secondary | ICD-10-CM

## 2020-02-17 LAB — POCT INR: INR: 2.5 (ref 2.0–3.0)

## 2020-03-03 LAB — POCT INR: INR: 3.5 — AB (ref 2.0–3.0)

## 2020-03-04 ENCOUNTER — Ambulatory Visit (INDEPENDENT_AMBULATORY_CARE_PROVIDER_SITE_OTHER): Payer: Medicare Other | Admitting: Cardiology

## 2020-03-04 DIAGNOSIS — Z5181 Encounter for therapeutic drug level monitoring: Secondary | ICD-10-CM

## 2020-03-04 DIAGNOSIS — I482 Chronic atrial fibrillation, unspecified: Secondary | ICD-10-CM

## 2020-03-04 DIAGNOSIS — Z7901 Long term (current) use of anticoagulants: Secondary | ICD-10-CM | POA: Diagnosis not present

## 2020-03-19 ENCOUNTER — Other Ambulatory Visit: Payer: Self-pay | Admitting: Cardiology

## 2020-03-28 DIAGNOSIS — I509 Heart failure, unspecified: Secondary | ICD-10-CM | POA: Insufficient documentation

## 2020-03-28 DIAGNOSIS — K859 Acute pancreatitis without necrosis or infection, unspecified: Secondary | ICD-10-CM | POA: Insufficient documentation

## 2020-03-28 DIAGNOSIS — J449 Chronic obstructive pulmonary disease, unspecified: Secondary | ICD-10-CM | POA: Insufficient documentation

## 2020-03-28 DIAGNOSIS — N400 Enlarged prostate without lower urinary tract symptoms: Secondary | ICD-10-CM | POA: Insufficient documentation

## 2020-03-28 DIAGNOSIS — R0600 Dyspnea, unspecified: Secondary | ICD-10-CM | POA: Insufficient documentation

## 2020-03-28 DIAGNOSIS — K219 Gastro-esophageal reflux disease without esophagitis: Secondary | ICD-10-CM | POA: Insufficient documentation

## 2020-03-28 DIAGNOSIS — J189 Pneumonia, unspecified organism: Secondary | ICD-10-CM | POA: Insufficient documentation

## 2020-03-28 DIAGNOSIS — Z950 Presence of cardiac pacemaker: Secondary | ICD-10-CM | POA: Insufficient documentation

## 2020-03-28 DIAGNOSIS — I4891 Unspecified atrial fibrillation: Secondary | ICD-10-CM | POA: Insufficient documentation

## 2020-03-28 DIAGNOSIS — J45909 Unspecified asthma, uncomplicated: Secondary | ICD-10-CM | POA: Insufficient documentation

## 2020-03-28 DIAGNOSIS — C801 Malignant (primary) neoplasm, unspecified: Secondary | ICD-10-CM | POA: Insufficient documentation

## 2020-03-29 ENCOUNTER — Encounter: Payer: Self-pay | Admitting: Cardiology

## 2020-03-29 ENCOUNTER — Other Ambulatory Visit: Payer: Self-pay

## 2020-03-29 ENCOUNTER — Ambulatory Visit (INDEPENDENT_AMBULATORY_CARE_PROVIDER_SITE_OTHER): Payer: Medicare Other

## 2020-03-29 ENCOUNTER — Ambulatory Visit (INDEPENDENT_AMBULATORY_CARE_PROVIDER_SITE_OTHER): Payer: Medicare Other | Admitting: Cardiology

## 2020-03-29 VITALS — BP 122/74 | HR 80 | Ht 72.0 in | Wt 185.2 lb

## 2020-03-29 DIAGNOSIS — Z5181 Encounter for therapeutic drug level monitoring: Secondary | ICD-10-CM | POA: Diagnosis not present

## 2020-03-29 DIAGNOSIS — Z7901 Long term (current) use of anticoagulants: Secondary | ICD-10-CM | POA: Diagnosis not present

## 2020-03-29 DIAGNOSIS — I482 Chronic atrial fibrillation, unspecified: Secondary | ICD-10-CM

## 2020-03-29 DIAGNOSIS — E782 Mixed hyperlipidemia: Secondary | ICD-10-CM

## 2020-03-29 DIAGNOSIS — I4891 Unspecified atrial fibrillation: Secondary | ICD-10-CM | POA: Diagnosis not present

## 2020-03-29 DIAGNOSIS — I5032 Chronic diastolic (congestive) heart failure: Secondary | ICD-10-CM | POA: Diagnosis not present

## 2020-03-29 DIAGNOSIS — I2510 Atherosclerotic heart disease of native coronary artery without angina pectoris: Secondary | ICD-10-CM

## 2020-03-29 LAB — POCT INR: INR: 3.5 — AB (ref 2.0–3.0)

## 2020-03-29 NOTE — Patient Instructions (Signed)
Continue taking 1/2 tablet (1.25 mg) daily except for 2.5mg  every Friday. Repeat INR in 2 weeks (self-test).

## 2020-03-29 NOTE — Patient Instructions (Addendum)
Medication Instructions:  Your physician recommends that you continue on your current medications as directed. Please refer to the Current Medication list given to you today.  *If you need a refill on your cardiac medications before your next appointment, please call your pharmacy*   Lab Work: Your physician recommends that you return for lab work in: TODAY CMP, Lipids, ProBNP If you have labs (blood work) drawn today and your tests are completely normal, you will receive your results only by: Marland Kitchen MyChart Message (if you have MyChart) OR . A paper copy in the mail If you have any lab test that is abnormal or we need to change your treatment, we will call you to review the results.   Testing/Procedures: None   Follow-Up: At [facility], you and your health needs are our priority.  As part of our continuing mission to provide you with exceptional heart care, we have created designated Provider Care Teams.  These Care Teams include your primary Cardiologist (physician) and Advanced Practice Providers (APPs -  Physician Assistants and Nurse Practitioners) who all work together to provide you with the care you need, when you need it.  We recommend signing up for the patient portal called "MyChart".  Sign up information is provided on this After Visit Summary.  MyChart is used to connect with patients for Virtual Visits (Telemedicine).  Patients are able to view lab/test results, encounter notes, upcoming appointments, etc.  Non-urgent messages can be sent to your provider as well.   To learn more about what you can do with MyChart, go to [email or web address].    Your next appointment:   6 month(s)  The format for your next appointment:   In Person  Provider:   Shirlee More, MD   Other Instructions

## 2020-03-29 NOTE — Progress Notes (Signed)
Cardiology Office Note:    Date:  03/29/2020   ID:  Jerome Newman, DOB Jun 15, 1930, MRN [id number]  PCP:  [name]., MD  Cardiologist:  Shirlee More, MD    Referring MD: [name]., MD    ASSESSMENT:    1. Chronic diastolic congestive heart failure ([place])   2. Chronic atrial fibrillation (HCC)   3. Coronary artery disease involving native coronary artery of native heart without angina pectoris   4. Atrial fibrillation, unspecified type ([place])   5. Mixed hyperlipidemia    PLAN:    In order of problems listed above:  1. Stable his heart failure is well compensated he will continue his current loop diuretic and fortunately his wife who participated in the evaluation and decision-making manages them at home with sodium restriction.  Check renal function potassium today 2. Stable, rate is controlled does not require suppressant therapy pacemaker dependent and continue warfarin for stroke prophylaxis 3. Presently not on a statin will do a lipid profile today 4. PD stable continue his home oxygen.   Next appointment: 6 months   Medication Adjustments/Labs and Tests Ordered: Current medicines are reviewed at length with the patient today.  Concerns regarding medicines are outlined above.  Orders Placed This Encounter  Procedures  . Comprehensive metabolic panel  . Lipid panel  . Pro b natriuretic peptide (BNP)   No orders of the defined types were placed in this encounter.   Chief Complaint  Patient presents with  . Follow-up  . Atrial Fibrillation  . Anticoagulation  . Congestive Heart Failure    History of Present Illness:    Jerome Newman is a 86 y.o. male with a hx of lung Cancer, CHF, chronic Atrial Fibrillation, AVN ablation and BiV pacemaker, chronic anticoagulation for AF and previous remote DVT and PE  last seen 09/28/2019.He was admitted to [facility] ED and transferred to [facility] COVID-19 ammonia 06/02/2019.Marland Kitchen He  was treated with remdesivir Decadron and was discharged from the hospital 06/08/2019.  Pacemaker is followed by [facility] Compliance with diet, lifestyle and medications: Yes  He is slowly and steadily improved and seems to be back to his baseline.  He has made a decision to have no further orthopedic surgery for removal of hardware.  Compliant with his anticoagulant no bleeding complication.  Presently not having edema shortness of breath chest pain palpitation or syncope.  He is managed with warfarin through heart clinic and checks INRs at home. Past Medical History:  Diagnosis Date  . Asthma   . Atrial fibrillation ([place])   . BPH (benign prostatic hyperplasia)   . CAD (coronary artery disease) 09/28/2015  . Cancer ([place])   . CHF (congestive heart failure) ([place])   . Chronic anticoagulation 12/31/2016  . Chronic atrial fibrillation ([place]) 12/31/2016   SP AVN ablation and BiV pacemaker  . Closed bicondylar fracture of proximal end of right tibia 05/08/2019  . Closed displaced supracondylar fracture of distal end of left femur with intracondylar extension ([place]) 05/10/2019  . COPD (chronic obstructive pulmonary disease) ([place])   . COPD, mild ([place]) 09/28/2015  . COVID-19 virus infection 06/02/2019  . Diastolic congestive heart failure ([place]) 12/31/2016  . DVT (deep venous thrombosis) ([place]) 09/26/2013  . Dyspnea   . GERD (gastroesophageal reflux disease)   . Hyperlipidemia 12/31/2016  . Lung fibrosis ([place]) 06/01/2016  . Malignant neoplasm of upper lobe of left lung ([place]) 02/28/2015  . OSA (obstructive sleep apnea)  12/31/2016  . Oxygen dependent 12/31/2016   Overview:  O2 at 2L/min at night  . Pancreatitis   . Pneumonia   . Presence of permanent cardiac pacemaker   . Pulmonary embolism ([place]) 08/10/2013  . S/P AV nodal ablation 01/18/2016  . Status post biventricular pacemaker 01/18/2016  . Traumatic compartment syndrome of right lower extremity ([place]) 05/10/2019    Past Surgical History:    Procedure Laterality Date  . AV NODE ABLATION  01/18/2016  . BI-VENTRICULAR PACEMAKER INSERTION (CRT-P)    . BLADDER SURGERY     Removed cancer at [facility]  . CARDIOVERSION    . DRESSING CHANGE UNDER ANESTHESIA Left 05/10/2019   Procedure: Dressing Change Under Anesthesia;  Surgeon: Shona Needles, MD;  Location: [place];  Service: Orthopedics;  Laterality: Left;  . EXTERNAL FIXATION LEG Bilateral 05/08/2019   Procedure: EXTERNAL FIXATION LEG;  Surgeon: Shona Needles, MD;  Location: [place];  Service: Orthopedics;  Laterality: Bilateral;  . FASCIOTOMY Right 05/08/2019   Procedure: FASCIOTOMY;  Surgeon: Shona Needles, MD;  Location: [place];  Service: Orthopedics;  Laterality: Right;  . FRACTURE SURGERY    . HERNIA REPAIR    . INSERT / REPLACE / REMOVE PACEMAKER    . LUNG REMOVAL, PARTIAL Right   . ORIF TIBIA PLATEAU Right 05/10/2019   Procedure: OPEN REDUCTION INTERNAL FIXATION (ORIF) TIBIAL PLATEAU RIGHT;  Surgeon: Shona Needles, MD;  Location: [place];  Service: Orthopedics;  Laterality: Right;  . SECONDARY CLOSURE OF WOUND Right 05/10/2019   Procedure: SECONDARY CLOSURE OF WOUND RIGHT LEG;  Surgeon: Shona Needles, MD;  Location: [place];  Service: Orthopedics;  Laterality: Right;    Current Medications: Current Meds  Medication Sig  . acetaminophen (TYLENOL) 325 MG tablet Take 2 tablets (650 mg total) by mouth every 6 (six) hours as needed for mild pain or headache (fever >/= 101).  Marland Kitchen ipratropium-albuterol (DUONEB) 0.5-2.5 (3) MG/3ML SOLN Inhale into the lungs.  . meloxicam (MOBIC) 15 MG tablet Take 15 mg by mouth daily.  . OXYGEN Inhale 2 L/min into the lungs at bedtime.  . pantoprazole (PROTONIX) 40 MG tablet Take 1 tablet (40 mg total) by mouth daily.  Vladimir Faster Glyc-Propyl Glyc PF (SYSTANE PRESERVATIVE FREE) 0.4-0.3 % SOLN Place 2 drops into both eyes every 6 (six) hours as needed (for dryness).   . potassium chloride SA (KLOR-CON) 20 MEQ tablet TAKE 1 TABLET BY MOUTH ONCE DAILY  .  torsemide (DEMADEX) 20 MG tablet Take 1 tablet (20 mg total) by mouth daily.  Marland Kitchen warfarin (COUMADIN) 1 MG tablet Take 1 tablet (1 mg total) by mouth daily at 6 PM.  . warfarin (COUMADIN) 2.5 MG tablet TAKE 1/2 TO 1 TABLET BY MOUTH DAILY AS DIRECTED BY COUMADIN CLINIC     Allergies:   Gabapentin and Nsaids   Social History   Socioeconomic History  . Marital status: Married    Spouse name: Not on file  . Number of children: Not on file  . Years of education: Not on file  . Highest education level: Not on file  Occupational History  . Not on file  Tobacco Use  . Smoking status: Former Research scientist (life sciences)  . Smokeless tobacco: Never Used  Vaping Use  . Vaping Use: Never used  Substance and Sexual Activity  . Alcohol use: Not Currently  . Drug use: Never  . Sexual activity: Not on file  Other Topics Concern  . Not on file  Social History Narrative  .  Not on file   Social Determinants of Health   Financial Resource Strain: Low Risk   . Difficulty of Paying Living Expenses: Not hard at all  Food Insecurity: Unknown  . Worried About Charity fundraiser in the Last Year: Never true  . Ran Out of Food in the Last Year: Not on file  Transportation Needs: No Transportation Needs  . Lack of Transportation (Medical): No  . Lack of Transportation (Non-Medical): No  Physical Activity:   . Days of Exercise per Week: Not on file  . Minutes of Exercise per Session: Not on file  Stress:   . Feeling of Stress : Not on file  Social Connections:   . Frequency of Communication with Friends and Family: Not on file  . Frequency of Social Gatherings with Friends and Family: Not on file  . Attends Religious Services: Not on file  . Active Member of Clubs or Organizations: Not on file  . Attends Archivist Meetings: Not on file  . Marital Status: Not on file     Family History: The patient's family history includes Cancer in his sister; Heart disease in his father. ROS:   Please see the  history of present illness.    All other systems reviewed and are negative.  EKGs/Labs/Other Studies Reviewed:    The following studies were reviewed today:  Recent Labs: 04/30/2019: TSH 1.450 06/02/2019: B Natriuretic Peptide 171.6 06/05/2019: Magnesium 1.9 06/07/2019: ALT 62; Hemoglobin 11.7; Platelets 252 09/28/2019: BUN 20; Creatinine, Ser 1.08; NT-Pro BNP 2,239; Potassium 4.5; Sodium 143  Recent Lipid Panel    Component Value Date/Time   CHOL 118 08/30/2018 1718   TRIG 49 08/30/2018 1718   HDL 46 08/30/2018 1718   CHOLHDL 2.6 08/30/2018 1718   VLDL 10 08/30/2018 1718   LDLCALC 62 08/30/2018 1718    Physical Exam:    VS:  BP 122/74   Pulse 80   Ht 6' (1.829 m)   Wt 185 lb 3.2 oz (84 kg)   SpO2 96%   BMI 25.12 kg/m     Wt Readings from Last 3 Encounters:  03/29/20 185 lb 3.2 oz (84 kg)  09/28/19 183 lb 3.2 oz (83.1 kg)  06/06/19 173 lb 11.6 oz (78.8 kg)     GEN:  Well nourished, well developed in no acute distress HEENT: Normal NECK: No JVD; No carotid bruits LYMPHATICS: No lymphadenopathy CARDIAC: RRR, no murmurs, rubs, gallops RESPIRATORY:  Clear to auscultation without rales, wheezing or rhonchi  ABDOMEN: Soft, non-tender, non-distended MUSCULOSKELETAL:  No edema; No deformity  SKIN: Warm and dry NEUROLOGIC:  Alert and oriented x 3 PSYCHIATRIC:  Normal affect    Signed, Shirlee More, MD  03/29/2020 10:44 AM    [place]

## 2020-03-30 ENCOUNTER — Telehealth: Payer: Self-pay | Admitting: Emergency Medicine

## 2020-03-30 ENCOUNTER — Ambulatory Visit: Admit: 2020-03-30 | Payer: Medicare Other | Admitting: Student

## 2020-03-30 LAB — COMPREHENSIVE METABOLIC PANEL
ALT: 9 IU/L (ref 0–44)
AST: 20 IU/L (ref 0–40)
Albumin/Globulin Ratio: 2 (ref 1.2–2.2)
Albumin: 4.4 g/dL (ref 3.6–4.6)
Alkaline Phosphatase: 148 IU/L — ABNORMAL HIGH (ref 44–121)
BUN/Creatinine Ratio: 18 (ref 10–24)
BUN: 25 mg/dL (ref 8–27)
Bilirubin Total: 0.8 mg/dL (ref 0.0–1.2)
CO2: 27 mmol/L (ref 20–29)
Calcium: 9.9 mg/dL (ref 8.6–10.2)
Chloride: 99 mmol/L (ref 96–106)
Creatinine, Ser: 1.39 mg/dL — ABNORMAL HIGH (ref 0.76–1.27)
GFR calc Af Amer: 52 mL/min/{1.73_m2} — ABNORMAL LOW (ref >59)
GFR calc non Af Amer: 45 mL/min/{1.73_m2} — ABNORMAL LOW (ref >59)
Globulin, Total: 2.2 g/dL (ref 1.5–4.5)
Glucose: 89 mg/dL (ref 65–99)
Potassium: 4.2 mmol/L (ref 3.5–5.2)
Sodium: 138 mmol/L (ref 134–144)
Total Protein: 6.6 g/dL (ref 6.0–8.5)

## 2020-03-30 LAB — LIPID PANEL
Chol/HDL Ratio: 3.8 ratio (ref 0.0–5.0)
Cholesterol, Total: 210 mg/dL — ABNORMAL HIGH (ref 100–199)
HDL: 55 mg/dL (ref >39)
LDL Chol Calc (NIH): 138 mg/dL — ABNORMAL HIGH (ref 0–99)
Triglycerides: 94 mg/dL (ref 0–149)
VLDL Cholesterol Cal: 17 mg/dL (ref 5–40)

## 2020-03-30 LAB — PRO B NATRIURETIC PEPTIDE: NT-Pro BNP: 1803 pg/mL — ABNORMAL HIGH (ref 0–486)

## 2020-03-30 SURGERY — REMOVAL, HARDWARE
Anesthesia: General | Laterality: Left

## 2020-03-30 MED ORDER — PRAVASTATIN SODIUM 20 MG PO TABS: 20.0000 mg | ORAL_TABLET | Freq: Every evening | ORAL | 1 refills | Status: DC

## 2020-03-30 NOTE — Addendum Note (Signed)
Addended by: Senaida Ores on: 03/30/2020 10:17 AM   Modules accepted: Orders

## 2020-03-30 NOTE — Telephone Encounter (Signed)
Called patient informed wife per dpr for patient to start pravastatin 20 mg daily. She verbally understood.

## 2020-03-30 NOTE — Telephone Encounter (Signed)
Pravastatin 20 mg daily evening meal

## 2020-03-30 NOTE — Telephone Encounter (Signed)
Called patient. Informed wife of results per dpr. She reports the patient is ok with starting a statin. Will consult with dr. Bettina Gavia to get recommendation on this.

## 2020-04-12 LAB — POCT INR: INR: 3.9 — AB (ref 2.0–3.0)

## 2020-04-13 ENCOUNTER — Ambulatory Visit (INDEPENDENT_AMBULATORY_CARE_PROVIDER_SITE_OTHER): Payer: Medicare Other | Admitting: Cardiology

## 2020-04-13 DIAGNOSIS — I482 Chronic atrial fibrillation, unspecified: Secondary | ICD-10-CM

## 2020-04-13 DIAGNOSIS — Z5181 Encounter for therapeutic drug level monitoring: Secondary | ICD-10-CM

## 2020-04-13 DIAGNOSIS — Z7901 Long term (current) use of anticoagulants: Secondary | ICD-10-CM | POA: Diagnosis not present

## 2020-04-26 ENCOUNTER — Ambulatory Visit (INDEPENDENT_AMBULATORY_CARE_PROVIDER_SITE_OTHER): Payer: Medicare Other | Admitting: Pharmacist Clinician (PhC)/ Clinical Pharmacy Specialist

## 2020-04-26 DIAGNOSIS — I482 Chronic atrial fibrillation, unspecified: Secondary | ICD-10-CM

## 2020-04-26 DIAGNOSIS — Z7901 Long term (current) use of anticoagulants: Secondary | ICD-10-CM

## 2020-04-26 DIAGNOSIS — I4891 Unspecified atrial fibrillation: Secondary | ICD-10-CM

## 2020-04-26 DIAGNOSIS — I2699 Other pulmonary embolism without acute cor pulmonale: Secondary | ICD-10-CM

## 2020-04-26 DIAGNOSIS — Z5181 Encounter for therapeutic drug level monitoring: Secondary | ICD-10-CM

## 2020-04-26 LAB — POCT INR: INR: 3.1 — AB (ref 2.0–3.0)

## 2020-05-18 ENCOUNTER — Telehealth: Payer: Self-pay

## 2020-05-18 NOTE — Telephone Encounter (Signed)
Called and lmomed for overdue inr 

## 2020-05-31 ENCOUNTER — Ambulatory Visit (INDEPENDENT_AMBULATORY_CARE_PROVIDER_SITE_OTHER): Payer: Medicare Other | Admitting: Internal Medicine

## 2020-05-31 DIAGNOSIS — I482 Chronic atrial fibrillation, unspecified: Secondary | ICD-10-CM | POA: Diagnosis not present

## 2020-05-31 DIAGNOSIS — Z7901 Long term (current) use of anticoagulants: Secondary | ICD-10-CM | POA: Diagnosis not present

## 2020-05-31 DIAGNOSIS — Z5181 Encounter for therapeutic drug level monitoring: Secondary | ICD-10-CM

## 2020-05-31 LAB — POCT INR: INR: 7.4 — AB (ref 2.0–3.0)

## 2020-06-09 ENCOUNTER — Telehealth: Payer: Self-pay

## 2020-06-09 NOTE — Telephone Encounter (Signed)
lmom for overdue inr 

## 2020-06-17 LAB — POCT INR: INR: 3.4 — AB (ref 2.0–3.0)

## 2020-06-20 ENCOUNTER — Ambulatory Visit (INDEPENDENT_AMBULATORY_CARE_PROVIDER_SITE_OTHER): Payer: Medicare Other | Admitting: Cardiovascular Disease

## 2020-06-20 DIAGNOSIS — Z5181 Encounter for therapeutic drug level monitoring: Secondary | ICD-10-CM

## 2020-06-20 DIAGNOSIS — Z7901 Long term (current) use of anticoagulants: Secondary | ICD-10-CM | POA: Diagnosis not present

## 2020-06-20 DIAGNOSIS — I482 Chronic atrial fibrillation, unspecified: Secondary | ICD-10-CM | POA: Diagnosis not present

## 2020-07-26 ENCOUNTER — Other Ambulatory Visit: Payer: Self-pay | Admitting: Cardiology

## 2020-07-26 NOTE — Telephone Encounter (Signed)
.  rxs

## 2020-07-26 NOTE — Telephone Encounter (Signed)
Refill sent to pharmacy.   

## 2020-07-28 LAB — POCT INR: INR: 3.4 — AB (ref 2.0–3.0)

## 2020-07-29 ENCOUNTER — Ambulatory Visit (INDEPENDENT_AMBULATORY_CARE_PROVIDER_SITE_OTHER): Payer: Medicare Other | Admitting: Internal Medicine

## 2020-07-29 DIAGNOSIS — Z5181 Encounter for therapeutic drug level monitoring: Secondary | ICD-10-CM

## 2020-07-29 DIAGNOSIS — Z7901 Long term (current) use of anticoagulants: Secondary | ICD-10-CM

## 2020-07-29 DIAGNOSIS — I482 Chronic atrial fibrillation, unspecified: Secondary | ICD-10-CM

## 2020-08-03 ENCOUNTER — Telehealth: Payer: Self-pay | Admitting: Cardiology

## 2020-08-03 ENCOUNTER — Other Ambulatory Visit: Payer: Self-pay | Admitting: Cardiology

## 2020-08-03 NOTE — Telephone Encounter (Signed)
Completed by eprescribe

## 2020-08-03 NOTE — Telephone Encounter (Signed)
   *  STAT* If patient is at the pharmacy, call can be transferred to refill team.   1. Which medications need to be refilled? (please list name of each medication and dose if known) warfarin (COUMADIN) 2.5 MG tablet  2. Which pharmacy/location (including street and city if local pharmacy) is medication to be sent to? [place]  3. Do they need a 30 day or 90 day supply? 90 days  Per notes: E-Prescribing Status: Transmission to pharmacy failed (07/26/2020 11:33 AM EST) Pt needs refill today. He only have 1 pill left

## 2020-08-18 ENCOUNTER — Telehealth: Payer: Self-pay

## 2020-08-18 NOTE — Telephone Encounter (Signed)
Called and spoke w/pt's wife regarding the need to do home inr check and report back. She stated that she will do it for [place] today and voiced understanding

## 2020-08-21 LAB — POCT INR: INR: 3.1 — AB (ref 2.0–3.0)

## 2020-08-22 ENCOUNTER — Ambulatory Visit (INDEPENDENT_AMBULATORY_CARE_PROVIDER_SITE_OTHER): Payer: Medicare Other | Admitting: Cardiovascular Disease

## 2020-08-22 DIAGNOSIS — Z5181 Encounter for therapeutic drug level monitoring: Secondary | ICD-10-CM

## 2020-08-22 DIAGNOSIS — Z7901 Long term (current) use of anticoagulants: Secondary | ICD-10-CM

## 2020-08-22 DIAGNOSIS — I482 Chronic atrial fibrillation, unspecified: Secondary | ICD-10-CM

## 2020-09-04 LAB — POCT INR: INR: 4.2 — AB (ref 2.0–3.0)

## 2020-09-05 ENCOUNTER — Ambulatory Visit (INDEPENDENT_AMBULATORY_CARE_PROVIDER_SITE_OTHER): Payer: Medicare Other | Admitting: Cardiology

## 2020-09-05 DIAGNOSIS — Z5181 Encounter for therapeutic drug level monitoring: Secondary | ICD-10-CM | POA: Diagnosis not present

## 2020-09-05 DIAGNOSIS — I482 Chronic atrial fibrillation, unspecified: Secondary | ICD-10-CM

## 2020-09-05 DIAGNOSIS — I2699 Other pulmonary embolism without acute cor pulmonale: Secondary | ICD-10-CM

## 2020-09-05 DIAGNOSIS — Z7901 Long term (current) use of anticoagulants: Secondary | ICD-10-CM | POA: Diagnosis not present

## 2020-09-06 ENCOUNTER — Other Ambulatory Visit: Payer: Self-pay

## 2020-09-06 DIAGNOSIS — I4891 Unspecified atrial fibrillation: Secondary | ICD-10-CM

## 2020-09-16 ENCOUNTER — Other Ambulatory Visit: Payer: Self-pay | Admitting: Cardiology

## 2020-09-18 LAB — POCT INR: INR: 3.5 — AB (ref 2.0–3.0)

## 2020-09-19 ENCOUNTER — Ambulatory Visit (INDEPENDENT_AMBULATORY_CARE_PROVIDER_SITE_OTHER): Payer: Medicare Other | Admitting: Internal Medicine

## 2020-09-19 DIAGNOSIS — Z5181 Encounter for therapeutic drug level monitoring: Secondary | ICD-10-CM

## 2020-09-19 DIAGNOSIS — I482 Chronic atrial fibrillation, unspecified: Secondary | ICD-10-CM | POA: Diagnosis not present

## 2020-09-19 DIAGNOSIS — Z7901 Long term (current) use of anticoagulants: Secondary | ICD-10-CM

## 2020-09-19 DIAGNOSIS — I2699 Other pulmonary embolism without acute cor pulmonale: Secondary | ICD-10-CM

## 2020-09-21 ENCOUNTER — Ambulatory Visit (INDEPENDENT_AMBULATORY_CARE_PROVIDER_SITE_OTHER): Payer: Medicare Other | Admitting: Cardiology

## 2020-09-21 ENCOUNTER — Encounter: Payer: Self-pay | Admitting: Cardiology

## 2020-09-21 ENCOUNTER — Other Ambulatory Visit: Payer: Self-pay

## 2020-09-21 VITALS — BP 122/60 | HR 80 | Ht 72.0 in | Wt 182.0 lb

## 2020-09-21 DIAGNOSIS — Z950 Presence of cardiac pacemaker: Secondary | ICD-10-CM

## 2020-09-21 DIAGNOSIS — I5032 Chronic diastolic (congestive) heart failure: Secondary | ICD-10-CM | POA: Diagnosis not present

## 2020-09-21 DIAGNOSIS — Z7901 Long term (current) use of anticoagulants: Secondary | ICD-10-CM | POA: Diagnosis not present

## 2020-09-21 DIAGNOSIS — I482 Chronic atrial fibrillation, unspecified: Secondary | ICD-10-CM

## 2020-09-21 NOTE — Patient Instructions (Signed)

## 2020-09-21 NOTE — Progress Notes (Signed)
Cardiology Office Note:    Date:  09/21/2020   ID:  Jerome Newman, DOB 05-Feb-1930, MRN [id number]  PCP:  [name]., MD  Cardiologist:  Shirlee More, MD    Referring MD: [name]., MD    ASSESSMENT:    1. Chronic atrial fibrillation ([place])   2. Chronic anticoagulation   3. Presence of permanent cardiac pacemaker   4. Chronic diastolic congestive heart failure ([place])    PLAN:    In order of problems listed above:  1. He has done very well with the strategy of AV nodal ablation pacemaker asymptomatic from atrial fibrillation remains anticoagulated with warfarin.  Goal INR 2.5-3.  Anticoagulation managed through her anticoagulant clinic. 2. Stable function follow-up [facility] EP 3. Well compensated BP is at target he has no fluid overload continue his current dose of loop diuretic 4. Stable hyperlipidemia continue statin   Next appointment: He request to be seen in 1 year   Medication Adjustments/Labs and Tests Ordered: Current medicines are reviewed at length with the patient today.  Concerns regarding medicines are outlined above.  Orders Placed This Encounter  Procedures  . EKG 12-Lead   No orders of the defined types were placed in this encounter.   Chief Complaint  Patient presents with  . Follow-up  . Atrial Fibrillation    And pacemaker  . Anticoagulation  . Congestive Heart Failure    History of Present Illness:    Jerome Newman is a 87 y.o. male with a hx of chronic diastolic heart failure chronic atrial fibrillation with previous AV nodal ablation and permanent pacemaker for rate control coronary artery disease hyperlipidemia lung cancer previous remote pulmonary thromboembolism and COVID-19 requiring hospitalization January 2020.  He was last seen 03/29/2020.  Geologist is at [facility] his last device check 08/25/2020 showed normal biventricular pacemaker function with recommendation for device follow-up in 1 year and projected  battery life of 4 years.  Compliance with diet, lifestyle and medications: Yes  He has lost weight but is recovering from his last bout of pancreatitis. He has chronic shortness of breath with activity related to his chronic lung disease his heart failure is compensated without fluid overload his pacemaker is followed in the [facility] practice and is having no palpitations syncope chest pain edema orthopnea and recent INR check Sunday 3.5.  Recently admitted to [facility] 08/13/2020 2 08/15/2020 with recurrent acute pancreatitis.  Treatment involved bowel rest IV fluids and analgesics.  CBC showed a hemoglobin of 12.3 white count 6700 platelets mildly diminished 116,000 creatinine 0.90 potassium 4.0 INR 3.9 lipase 11,637 severely elevated troponin negative proBNP level modestly elevated 1290.  Chest x-ray showed small left upper lobe scarring and/or atelectasis consistent with post radiation changes.  EKG independently reviewed 08/12/2020 shows underlying atrial fibrillation 100% RV paced rhythm.  CT the abdomen pelvis did show aortic aneurysm 41 mm infrarenal aorta her findings of moderate to severe acute pancreatitis. Past Medical History:  Diagnosis Date  . Asthma   . Atrial fibrillation ([place])   . BPH (benign prostatic hyperplasia)   . CAD (coronary artery disease) 09/28/2015  . Cancer ([place])   . CHF (congestive heart failure) ([place])   . Chronic anticoagulation 12/31/2016  . Chronic atrial fibrillation ([place]) 12/31/2016   SP AVN ablation and BiV pacemaker  . Closed bicondylar fracture of proximal end of right tibia 05/08/2019  . Closed displaced supracondylar fracture of distal end of left femur with intracondylar extension ([place])  05/10/2019  . COPD (chronic obstructive pulmonary disease) ([place])   . COPD, mild ([place]) 09/28/2015  . COVID-19 virus infection 06/02/2019  . Diastolic congestive heart failure ([place]) 12/31/2016  . DVT (deep venous thrombosis) ([place]) 09/26/2013  . Dyspnea   . GERD  (gastroesophageal reflux disease)   . Hyperlipidemia 12/31/2016  . Lung fibrosis ([place]) 06/01/2016  . Malignant neoplasm of upper lobe of left lung ([place]) 02/28/2015  . OSA (obstructive sleep apnea) 12/31/2016  . Oxygen dependent 12/31/2016   Overview:  O2 at 2L/min at night  . Pancreatitis   . Pneumonia   . Presence of permanent cardiac pacemaker   . Pulmonary embolism ([place]) 08/10/2013  . S/P AV nodal ablation 01/18/2016  . Status post biventricular pacemaker 01/18/2016  . Traumatic compartment syndrome of right lower extremity ([place]) 05/10/2019    Past Surgical History:  Procedure Laterality Date  . AV NODE ABLATION  01/18/2016  . BI-VENTRICULAR PACEMAKER INSERTION (CRT-P)    . BLADDER SURGERY     Removed cancer at [facility]  . CARDIOVERSION    . DRESSING CHANGE UNDER ANESTHESIA Left 05/10/2019   Procedure: Dressing Change Under Anesthesia;  Surgeon: Shona Needles, MD;  Location: [place];  Service: Orthopedics;  Laterality: Left;  . EXTERNAL FIXATION LEG Bilateral 05/08/2019   Procedure: EXTERNAL FIXATION LEG;  Surgeon: Shona Needles, MD;  Location: [place];  Service: Orthopedics;  Laterality: Bilateral;  . FASCIOTOMY Right 05/08/2019   Procedure: FASCIOTOMY;  Surgeon: Shona Needles, MD;  Location: [place];  Service: Orthopedics;  Laterality: Right;  . FRACTURE SURGERY    . HERNIA REPAIR    . INSERT / REPLACE / REMOVE PACEMAKER    . LUNG REMOVAL, PARTIAL Right   . ORIF TIBIA PLATEAU Right 05/10/2019   Procedure: OPEN REDUCTION INTERNAL FIXATION (ORIF) TIBIAL PLATEAU RIGHT;  Surgeon: Shona Needles, MD;  Location: [place];  Service: Orthopedics;  Laterality: Right;  . SECONDARY CLOSURE OF WOUND Right 05/10/2019   Procedure: SECONDARY CLOSURE OF WOUND RIGHT LEG;  Surgeon: Shona Needles, MD;  Location[place];  Service: Orthopedics;  Laterality: Right;    Current Medications: Current Meds  Medication Sig  . acetaminophen (TYLENOL) 325 MG tablet Take 2 tablets (650 mg total) by mouth every 6 (six) hours  as needed for mild pain or headache (fever >/= 101).  Marland Kitchen ipratropium-albuterol (DUONEB) 0.5-2.5 (3) MG/3ML SOLN Inhale into the lungs.  . latanoprost (XALATAN) 0.005 % ophthalmic solution   . meloxicam (MOBIC) 15 MG tablet TAKE 1 TABLET BY MOUTH DAILY  . pantoprazole (PROTONIX) 40 MG tablet Take 1 tablet (40 mg total) by mouth daily.  Vladimir Faster Glyc-Propyl Glyc PF (SYSTANE PRESERVATIVE FREE) 0.4-0.3 % SOLN Place 2 drops into both eyes every 6 (six) hours as needed (for dryness).   . potassium chloride SA (KLOR-CON) 20 MEQ tablet TAKE 1 TABLET BY MOUTH ONCE DAILY  . pravastatin (PRAVACHOL) 20 MG tablet Take 1 tablet (20 mg total) by mouth every evening.  . torsemide (DEMADEX) 20 MG tablet Take 1 tablet (20 mg total) by mouth daily.  . VENTOLIN HFA 108 (90 Base) MCG/ACT inhaler Inhale 2 puffs into the lungs every 4 (four) hours as needed.  . warfarin (COUMADIN) 1 MG tablet Take 1 tablet (1 mg total) by mouth daily at 6 PM.  . warfarin (COUMADIN) 2.5 MG tablet TAKE 1/2 TO 1 TABLET BY MOUTH DAILY AS DIRECTED BY COUMADIN CLINIC     Allergies:   Gabapentin and Nsaids  Social History   Socioeconomic History  . Marital status: Married    Spouse name: Not on file  . Number of children: Not on file  . Years of education: Not on file  . Highest education level: Not on file  Occupational History  . Not on file  Tobacco Use  . Smoking status: Former Research scientist (life sciences)  . Smokeless tobacco: Never Used  Vaping Use  . Vaping Use: Never used  Substance and Sexual Activity  . Alcohol use: Not Currently  . Drug use: Never  . Sexual activity: Not on file  Other Topics Concern  . Not on file  Social History Narrative  . Not on file   Social Determinants of Health   Financial Resource Strain: Not on file  Food Insecurity: Not on file  Transportation Needs: Not on file  Physical Activity: Not on file  Stress: Not on file  Social Connections: Not on file     Family History: The patient's family  history includes Cancer in his sister; Heart disease in his father. ROS:   Please see the history of present illness.    All other systems reviewed and are negative.  EKGs/Labs/Other Studies Reviewed:    The following studies were reviewed today:  EKG:  EKG ordered today and personally reviewed.  The ekg ordered today demonstrates 100% ventricularly paced RV lead underlying A. fib normal pacemaker activity  Recent Labs: 03/29/2020: ALT 9; BUN 25; Creatinine, Ser 1.39; NT-Pro BNP 1,803; Potassium 4.2; Sodium 138  Recent Lipid Panel    Component Value Date/Time   CHOL 210 (H) 03/29/2020 1056   TRIG 94 03/29/2020 1056   HDL 55 03/29/2020 1056   CHOLHDL 3.8 03/29/2020 1056   CHOLHDL 2.6 08/30/2018 1718   VLDL 10 08/30/2018 1718   LDLCALC 138 (H) 03/29/2020 1056    Physical Exam:    VS:  BP 122/60   Pulse 80   Ht 6' (1.829 m)   Wt 182 lb (82.6 kg)   SpO2 99%   BMI 24.68 kg/m     Wt Readings from Last 3 Encounters:  09/21/20 182 lb (82.6 kg)  03/29/20 185 lb 3.2 oz (84 kg)  09/28/19 183 lb 3.2 oz (83.1 kg)     GEN: He has lost weight well nourished, well developed in no acute distress HEENT: Normal NECK: No JVD; No carotid bruits LYMPHATICS: No lymphadenopathy CARDIAC: Variable first heart sound RRR, no murmurs, rubs, gallops RESPIRATORY: Diffusely diminished breath sounds without rales, wheezing or rhonchi  ABDOMEN: Soft, non-tender, non-distended MUSCULOSKELETAL:  No edema; No deformity  SKIN: Warm and dry NEUROLOGIC:  Alert and oriented x 3 PSYCHIATRIC:  Normal affect    Signed, Shirlee More, MD  09/21/2020 3:18 PM    [facility]

## 2020-09-26 ENCOUNTER — Other Ambulatory Visit: Payer: Self-pay | Admitting: Cardiology

## 2020-09-26 NOTE — Telephone Encounter (Signed)
Refill sent to pharmacy.   

## 2020-10-03 LAB — POCT INR: INR: 3.3 — AB (ref 2.0–3.0)

## 2020-10-04 ENCOUNTER — Ambulatory Visit (INDEPENDENT_AMBULATORY_CARE_PROVIDER_SITE_OTHER): Payer: Medicare Other | Admitting: Cardiovascular Disease

## 2020-10-04 DIAGNOSIS — Z5181 Encounter for therapeutic drug level monitoring: Secondary | ICD-10-CM

## 2020-10-04 DIAGNOSIS — I482 Chronic atrial fibrillation, unspecified: Secondary | ICD-10-CM | POA: Diagnosis not present

## 2020-10-04 DIAGNOSIS — Z7901 Long term (current) use of anticoagulants: Secondary | ICD-10-CM | POA: Diagnosis not present

## 2020-10-17 LAB — POCT INR: INR: 3.7 — AB (ref 2.0–3.0)

## 2020-10-18 ENCOUNTER — Ambulatory Visit (INDEPENDENT_AMBULATORY_CARE_PROVIDER_SITE_OTHER): Payer: Medicare Other | Admitting: Cardiology

## 2020-10-18 DIAGNOSIS — I482 Chronic atrial fibrillation, unspecified: Secondary | ICD-10-CM

## 2020-10-18 DIAGNOSIS — I4891 Unspecified atrial fibrillation: Secondary | ICD-10-CM | POA: Diagnosis not present

## 2020-10-18 DIAGNOSIS — Z7901 Long term (current) use of anticoagulants: Secondary | ICD-10-CM

## 2020-10-18 DIAGNOSIS — Z9889 Other specified postprocedural states: Secondary | ICD-10-CM | POA: Diagnosis not present

## 2020-10-18 DIAGNOSIS — Z5181 Encounter for therapeutic drug level monitoring: Secondary | ICD-10-CM

## 2020-11-02 LAB — POCT INR: INR: 4 — AB (ref 2.0–3.0)

## 2020-11-03 ENCOUNTER — Ambulatory Visit (INDEPENDENT_AMBULATORY_CARE_PROVIDER_SITE_OTHER): Payer: Medicare Other | Admitting: Cardiology

## 2020-11-03 DIAGNOSIS — Z5181 Encounter for therapeutic drug level monitoring: Secondary | ICD-10-CM | POA: Diagnosis not present

## 2020-11-03 DIAGNOSIS — Z7901 Long term (current) use of anticoagulants: Secondary | ICD-10-CM | POA: Diagnosis not present

## 2020-11-03 DIAGNOSIS — I482 Chronic atrial fibrillation, unspecified: Secondary | ICD-10-CM | POA: Diagnosis not present

## 2020-11-16 ENCOUNTER — Ambulatory Visit (INDEPENDENT_AMBULATORY_CARE_PROVIDER_SITE_OTHER): Payer: Medicare Other | Admitting: Cardiology

## 2020-11-16 DIAGNOSIS — I482 Chronic atrial fibrillation, unspecified: Secondary | ICD-10-CM | POA: Diagnosis not present

## 2020-11-16 DIAGNOSIS — Z5181 Encounter for therapeutic drug level monitoring: Secondary | ICD-10-CM

## 2020-11-16 DIAGNOSIS — Z7901 Long term (current) use of anticoagulants: Secondary | ICD-10-CM | POA: Diagnosis not present

## 2020-11-16 LAB — POCT INR: INR: 3.3 — AB (ref 2.0–3.0)

## 2020-12-04 IMAGING — CT CT KNEE*R* W/O CM
3 of 5 series · 13 of 33 positions shown, 15 images · non-contrast
Comparison: Right knee x-rays from same day.

CLINICAL DATA: Tibial plateau fracture.

EXAM:
CT OF THE RIGHT KNEE WITHOUT CONTRAST
TECHNIQUE: Multidetector CT imaging of the right knee was performed according
to the standard protocol. Multiplanar CT image reconstructions were
also generated.

[Series 6: lower ext thin st · axial · 0.43mm/px · z∈[+404,+565]mm · 5 of 413 slices shown, 7 images]
[im 46/413  soft-tissue]
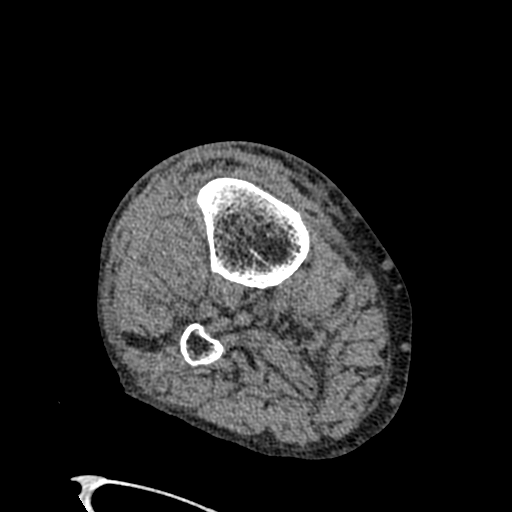
[im 46/413  bone]
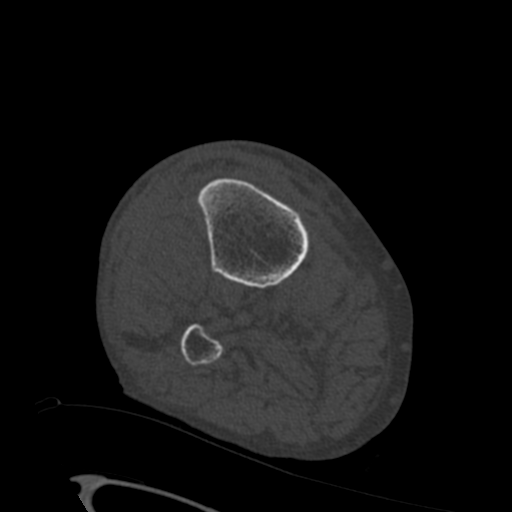
[im 138/413  bone]
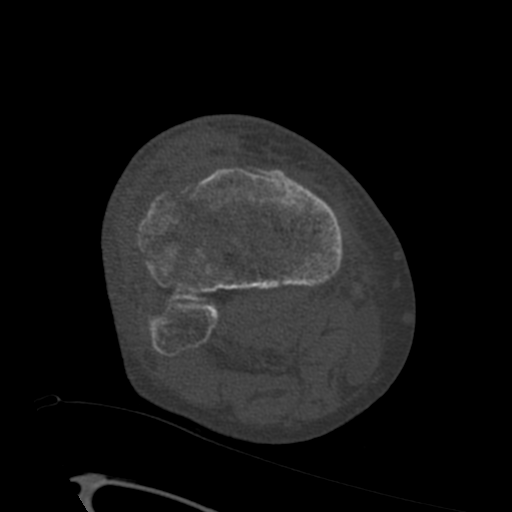
[im 229/413  bone]
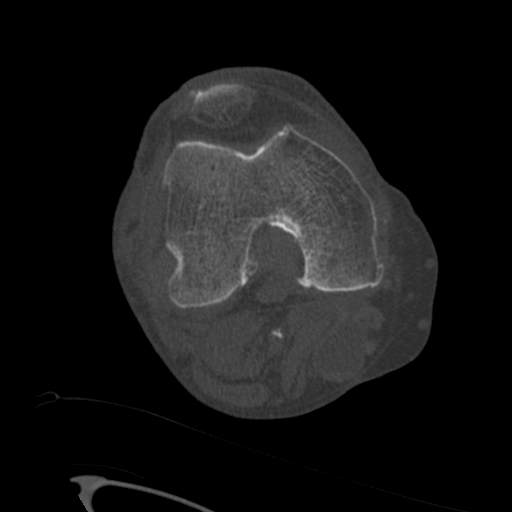
[im 275/413  bone]
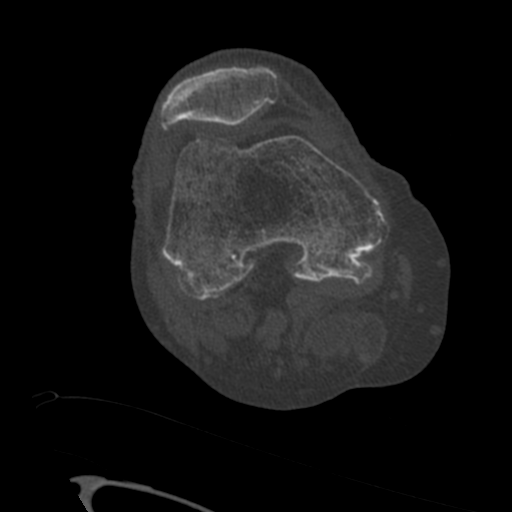
[im 367/413  soft-tissue]
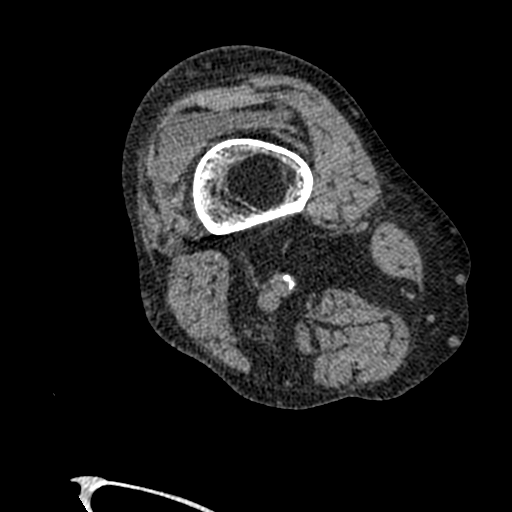
[im 367/413  bone]
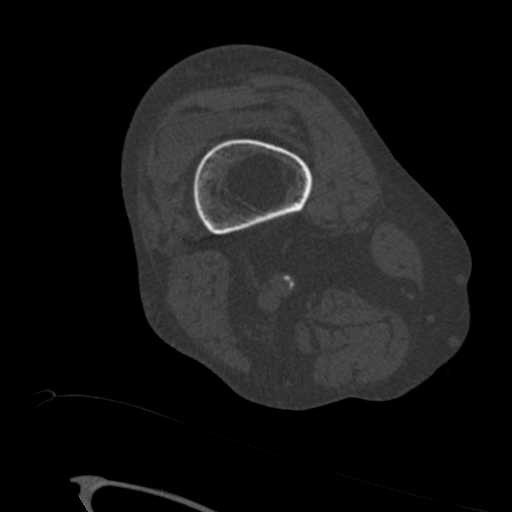

[Series 8: lower ext sag bone · sagittal · 0.40mm/px · 5 of 122 slices shown]
[im 21/122  bone]
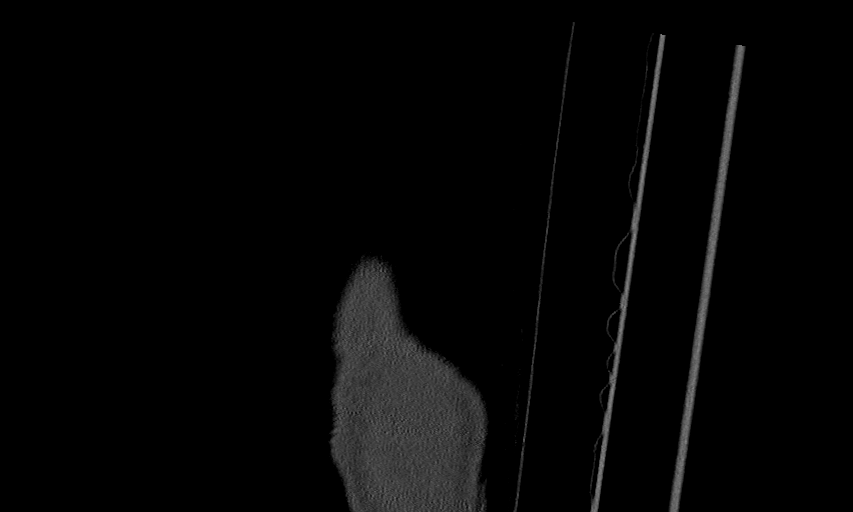
[im 41/122  bone]
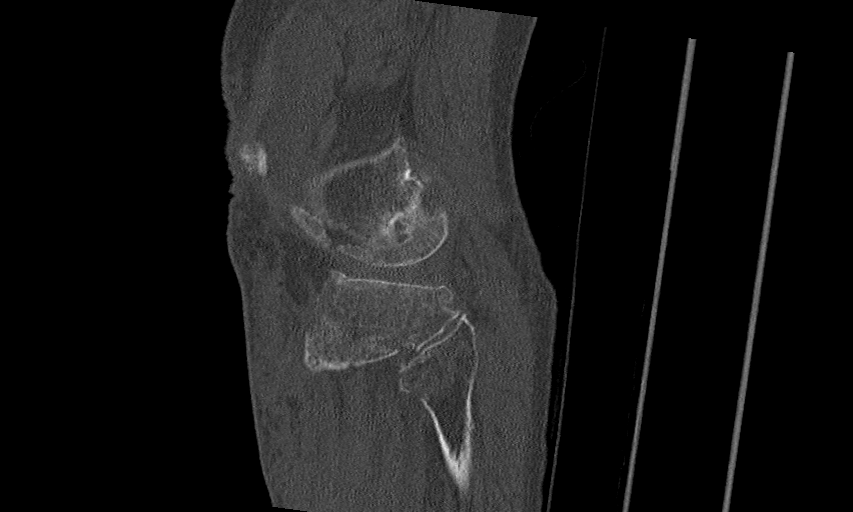
[im 61/122  bone]
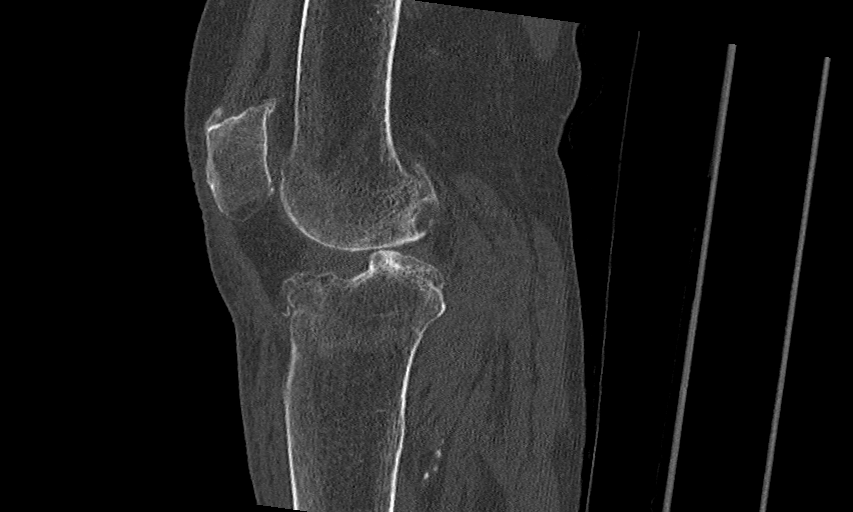
[im 81/122  bone]
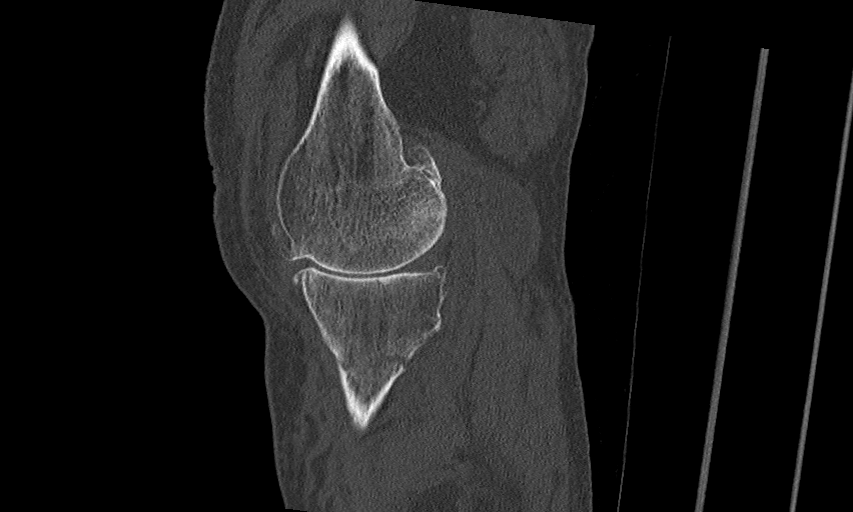
[im 101/122  bone]
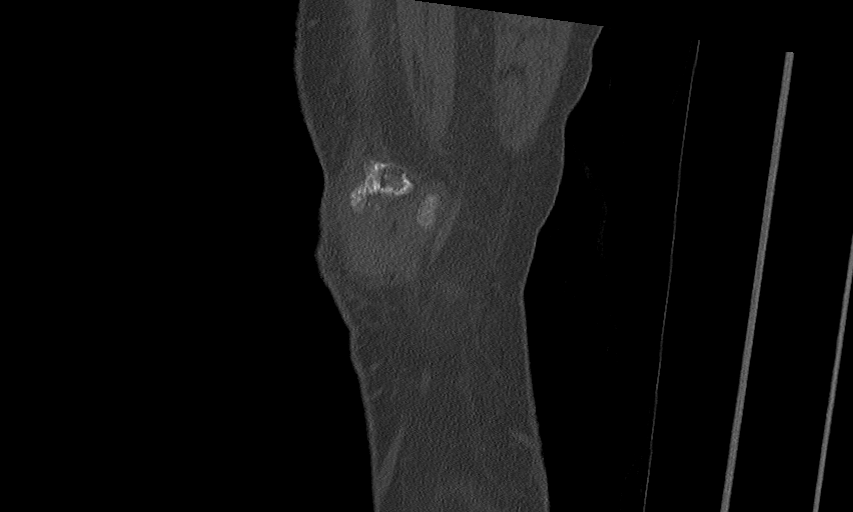

[Series 9: lower ext cor st · coronal · 0.35mm/px · 3 of 126 slices shown]
[im 26/126  bone]
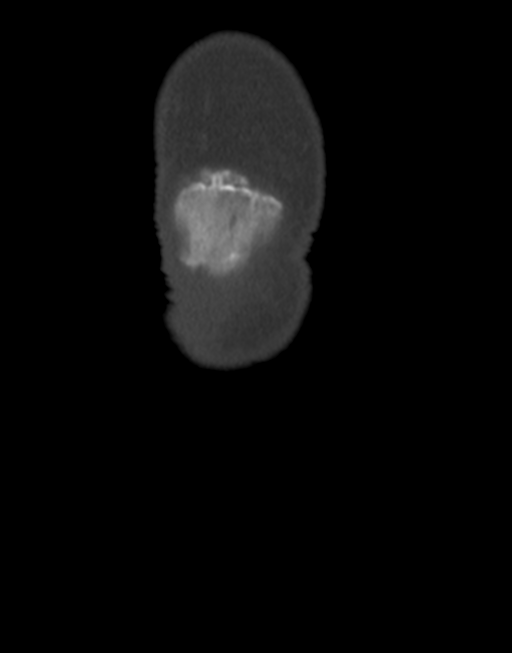
[im 51/126  bone]
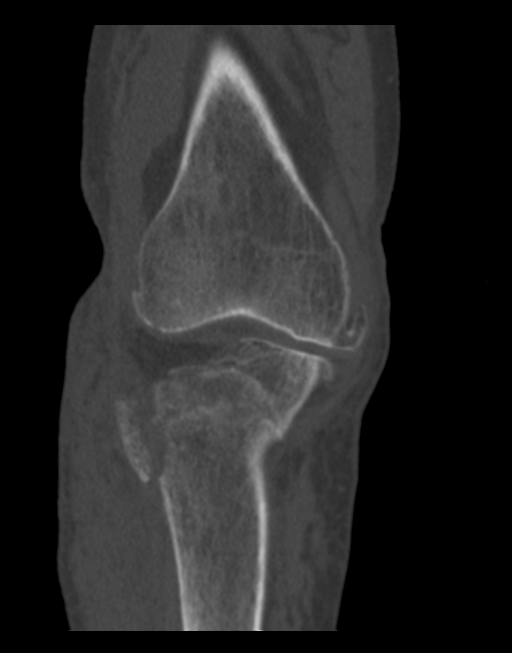
[im 76/126  bone]
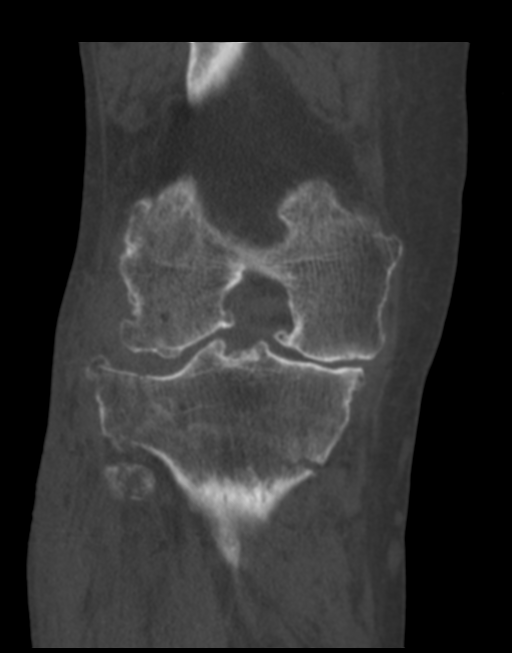

[13 of 33 positions shown; findings below may reference images not displayed]

FINDINGS: Bones/Joint/Cartilage

Acute comminuted fracture of the proximal tibia with transverse
component through the metaphysis and longitudinal component through
the lateral tibial plateau. No extension into the medial tibial
plateau. Slight lateral displacement of the dominant fragment. Acute
nondisplaced fracture of the anterior fibular head.

Moderate to severe medial and moderate lateral patellofemoral
compartment joint space narrowing. Tricompartmental osteophytes.
Moderate lipohemarthrosis. Moderate Baker cyst with small amount of
layering hemorrhage. Osteopenia.

Ligaments

Ligaments are suboptimally evaluated by CT.

Muscles and Tendons
Grossly intact.

Soft tissue
Soft tissue swelling. No fluid collection or hematoma. No soft
tissue mass.
IMPRESSION: 1. Acute split fracture of the lateral tibial plateau with
nondisplaced transverse fracture of the tibial metaphysis.
2. Acute nondisplaced fracture of the anterior fibular head.
3. Moderate lipohemarthrosis.
4. Tricompartmental osteoarthritis, moderate to severe in the medial
compartment.

## 2020-12-04 IMAGING — RF DG TIBIA/FIBULA 2V*R*
1 series · 6 of 6 positions shown · non-contrast
Comparison: May 08, 2019

CLINICAL DATA: Fracture.  ORIF.

EXAM:
RIGHT TIBIA AND FIBULA - 2 VIEW

[Series 1: run · 6 of 6 slices shown]
[im 1/6]
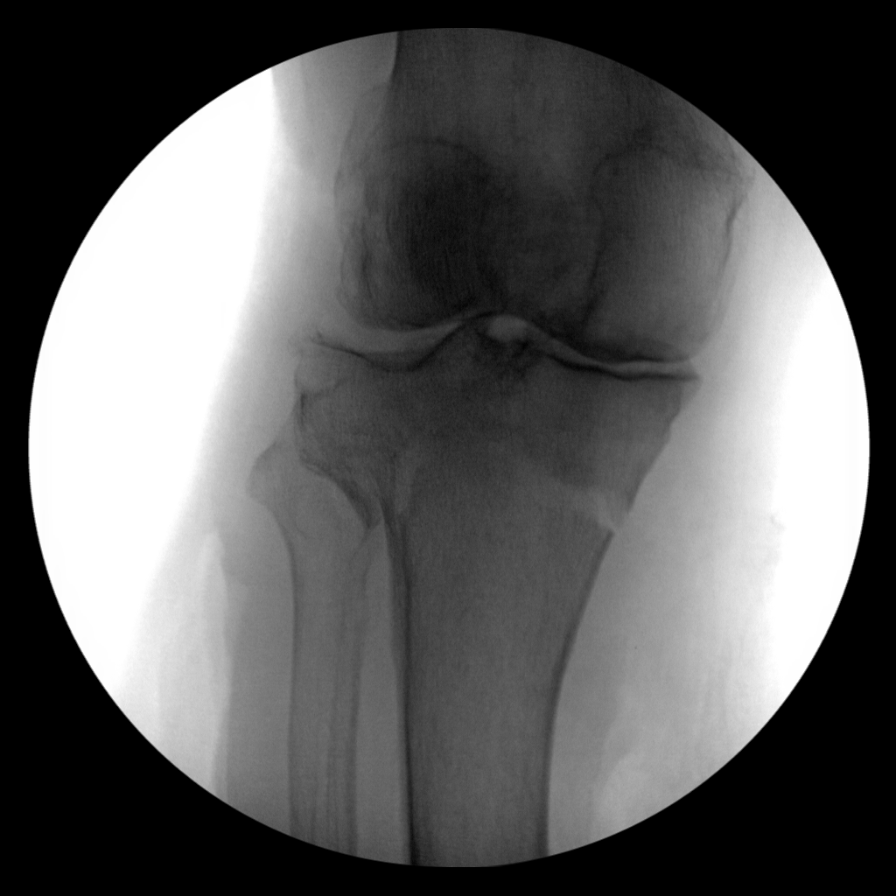
[im 2/6]
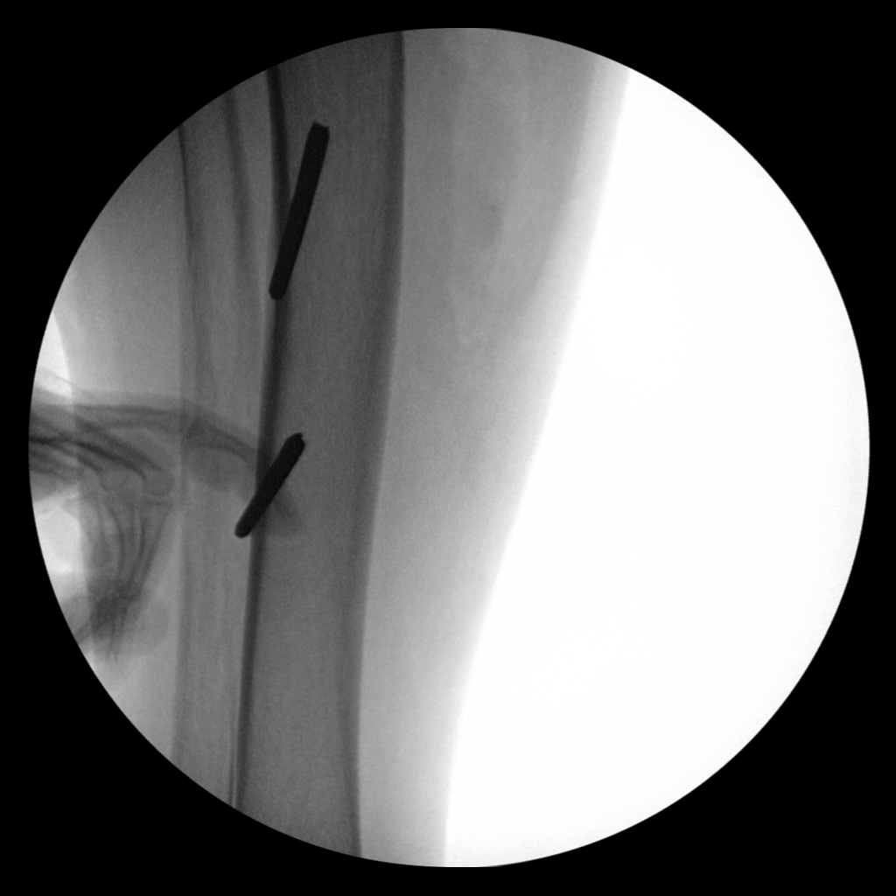
[im 3/6]
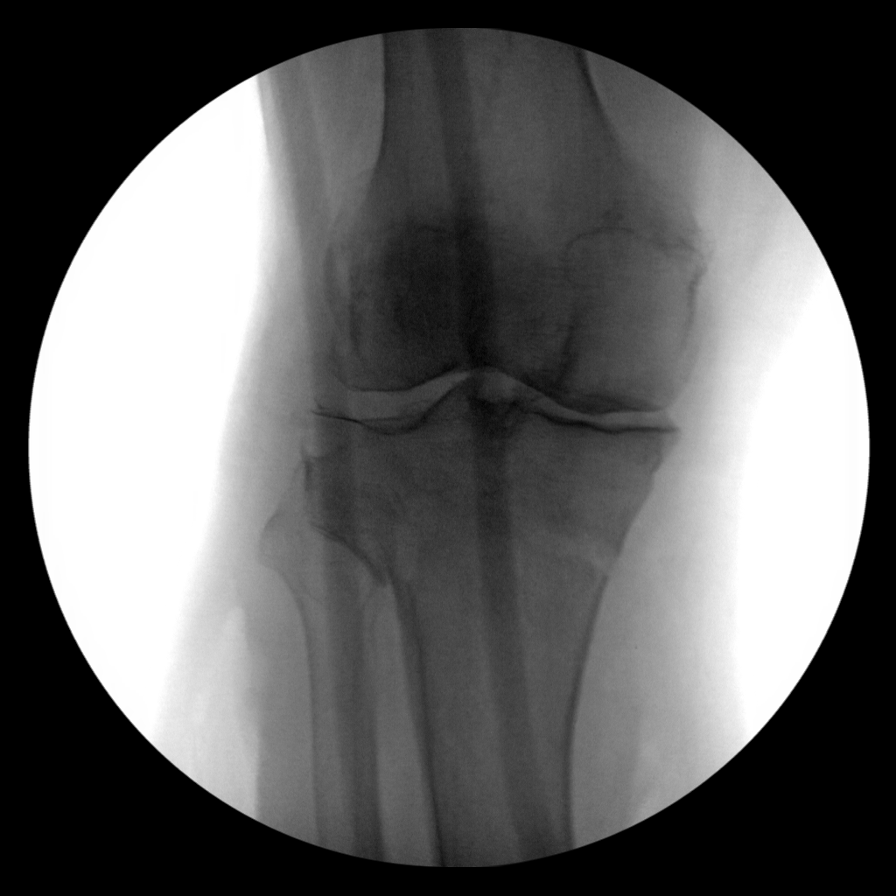
[im 4/6]
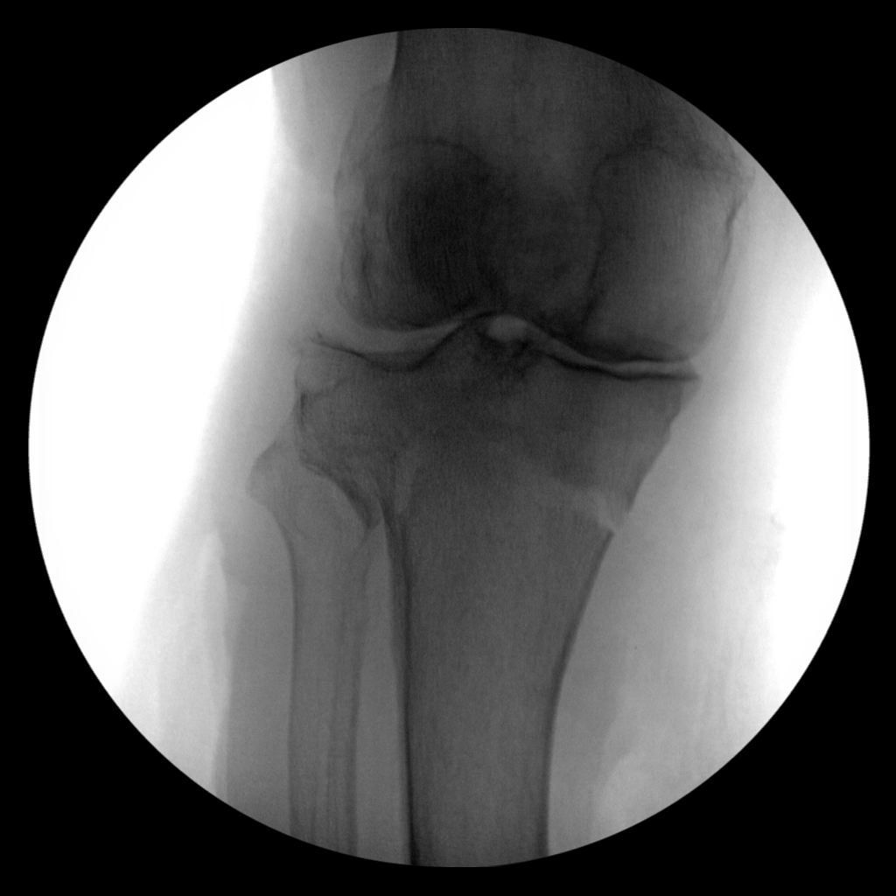
[im 5/6]
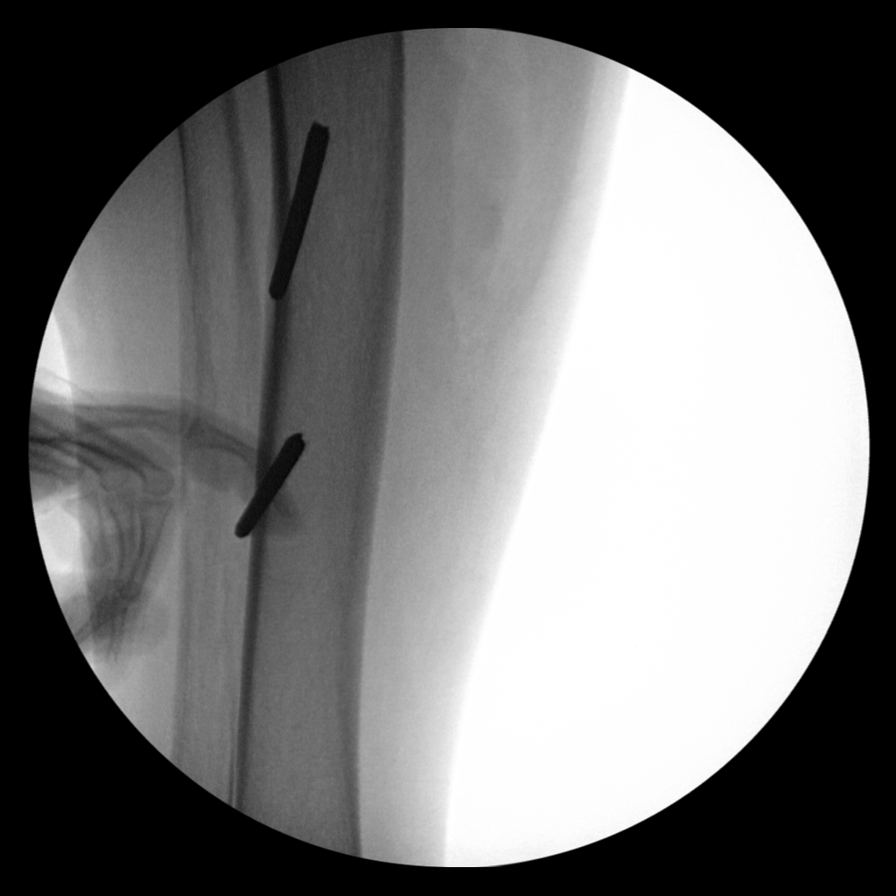
[im 6/6]
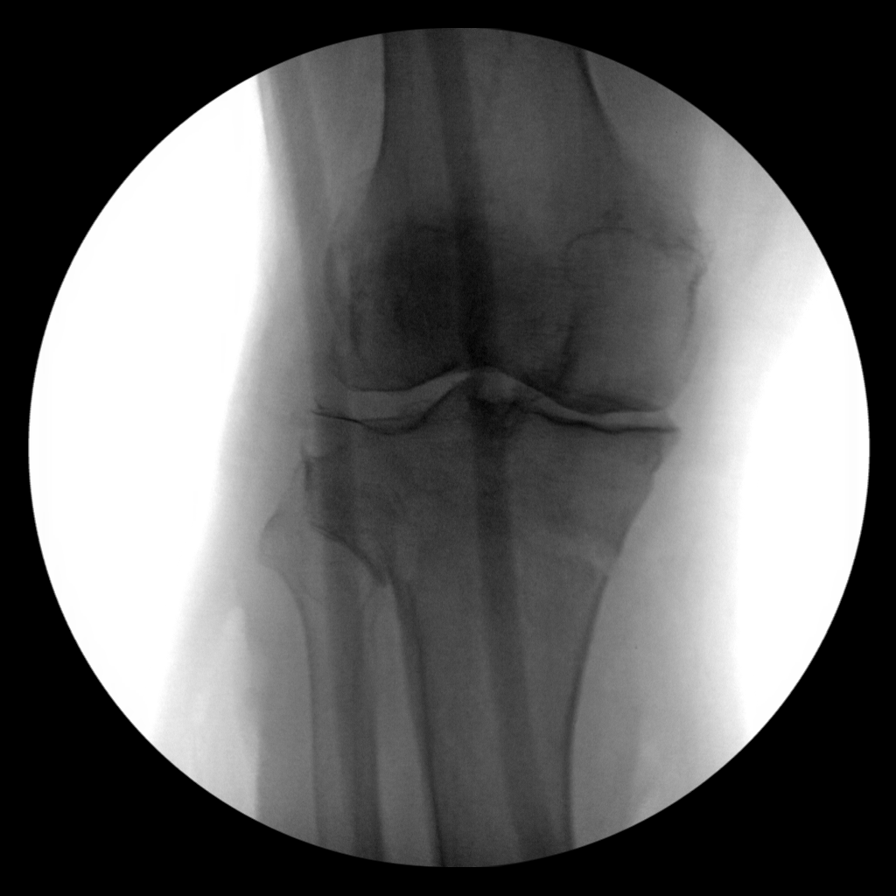

[6 of 6 positions shown; findings below may reference images not displayed]

FINDINGS: Again noted is a fracture of the proximal tibia. The patient has
undergone external fixation. The alignment is unchanged.
IMPRESSION: Status post ex fix of the right lower extremity with unchanged
osseous alignment.

## 2020-12-14 ENCOUNTER — Other Ambulatory Visit: Payer: Self-pay | Admitting: Cardiology

## 2020-12-14 NOTE — Telephone Encounter (Signed)
Lmom for pt to call us back. 

## 2020-12-19 NOTE — Telephone Encounter (Signed)
2ND LMOM TO CALL TO SCHEDULE INR CHECK

## 2020-12-23 ENCOUNTER — Ambulatory Visit (INDEPENDENT_AMBULATORY_CARE_PROVIDER_SITE_OTHER): Payer: Medicare Other | Admitting: Pharmacist

## 2020-12-23 DIAGNOSIS — Z5181 Encounter for therapeutic drug level monitoring: Secondary | ICD-10-CM

## 2020-12-23 DIAGNOSIS — Z7901 Long term (current) use of anticoagulants: Secondary | ICD-10-CM

## 2020-12-23 DIAGNOSIS — I482 Chronic atrial fibrillation, unspecified: Secondary | ICD-10-CM

## 2020-12-23 LAB — POCT INR: INR: 1.9 — AB (ref 2.0–3.0)

## 2020-12-26 NOTE — Telephone Encounter (Signed)
3rd and final message left for the patient to call to schedule inr check. I cannot refill this as the patient is overdue and has been noncompliant. I will send this to his cardiologist, Dr. Bettina Gavia to determine a course of action.

## 2020-12-26 NOTE — Telephone Encounter (Signed)
Pt's wife finally returned call and scheduled for tomorrow will route this back to nl anticoag and hold to make sure the pt actually shows up

## 2020-12-27 ENCOUNTER — Other Ambulatory Visit: Payer: Self-pay

## 2020-12-27 NOTE — Telephone Encounter (Signed)
Called the pt and apologized for not seeing that they were seen 12/23/20 and that todays appt isn't needed for the coumadin clinic. The pt's wife voiced understanding and I let her know that I sent the refill

## 2021-01-03 ENCOUNTER — Other Ambulatory Visit: Payer: Self-pay | Admitting: Cardiology

## 2021-01-03 NOTE — Telephone Encounter (Signed)
Rx sent 

## 2021-01-07 LAB — POCT INR: INR: 3.1 — AB (ref 2.0–3.0)

## 2021-01-09 ENCOUNTER — Ambulatory Visit (INDEPENDENT_AMBULATORY_CARE_PROVIDER_SITE_OTHER): Payer: Medicare Other | Admitting: Cardiology

## 2021-01-09 DIAGNOSIS — I482 Chronic atrial fibrillation, unspecified: Secondary | ICD-10-CM | POA: Diagnosis not present

## 2021-01-09 DIAGNOSIS — Z5181 Encounter for therapeutic drug level monitoring: Secondary | ICD-10-CM | POA: Diagnosis not present

## 2021-01-09 DIAGNOSIS — Z7901 Long term (current) use of anticoagulants: Secondary | ICD-10-CM

## 2021-01-22 LAB — POCT INR: INR: 2.4 (ref 2.0–3.0)

## 2021-01-23 ENCOUNTER — Ambulatory Visit (INDEPENDENT_AMBULATORY_CARE_PROVIDER_SITE_OTHER): Payer: Medicare Other | Admitting: Cardiovascular Disease

## 2021-01-23 DIAGNOSIS — I482 Chronic atrial fibrillation, unspecified: Secondary | ICD-10-CM | POA: Diagnosis not present

## 2021-01-23 DIAGNOSIS — Z7901 Long term (current) use of anticoagulants: Secondary | ICD-10-CM | POA: Diagnosis not present

## 2021-01-23 DIAGNOSIS — Z5181 Encounter for therapeutic drug level monitoring: Secondary | ICD-10-CM

## 2021-02-07 ENCOUNTER — Telehealth: Payer: Self-pay

## 2021-02-07 LAB — POCT INR: INR: 3 (ref 2.0–3.0)

## 2021-02-07 NOTE — Telephone Encounter (Signed)
FOR WARFARIN OVERDUE SELF TESTER

## 2021-02-07 NOTE — Telephone Encounter (Signed)
This encounter was created in error - please disregard.

## 2021-02-08 ENCOUNTER — Ambulatory Visit (INDEPENDENT_AMBULATORY_CARE_PROVIDER_SITE_OTHER): Payer: Medicare Other | Admitting: Cardiology

## 2021-02-08 DIAGNOSIS — Z5181 Encounter for therapeutic drug level monitoring: Secondary | ICD-10-CM

## 2021-02-08 DIAGNOSIS — I482 Chronic atrial fibrillation, unspecified: Secondary | ICD-10-CM

## 2021-02-08 DIAGNOSIS — I2699 Other pulmonary embolism without acute cor pulmonale: Secondary | ICD-10-CM

## 2021-02-08 DIAGNOSIS — Z7901 Long term (current) use of anticoagulants: Secondary | ICD-10-CM

## 2021-02-10 ENCOUNTER — Other Ambulatory Visit: Payer: Self-pay | Admitting: Cardiology

## 2021-02-14 ENCOUNTER — Other Ambulatory Visit: Payer: Self-pay | Admitting: Cardiology

## 2021-02-14 NOTE — Telephone Encounter (Addendum)
Prescription refill request received for warfarin Lov: 09/21/20 [facility]) Next INR check: 02/21/21 (Self Tester)  Warfarin tablet strength: 2.5mg    Appropriate dose and refill sent to requested pharmacy.

## 2021-02-20 LAB — POCT INR: INR: 3.5 — AB (ref 2.0–3.0)

## 2021-02-21 ENCOUNTER — Ambulatory Visit (INDEPENDENT_AMBULATORY_CARE_PROVIDER_SITE_OTHER): Payer: Medicare Other | Admitting: Cardiovascular Disease

## 2021-02-21 DIAGNOSIS — I482 Chronic atrial fibrillation, unspecified: Secondary | ICD-10-CM

## 2021-02-21 DIAGNOSIS — I4891 Unspecified atrial fibrillation: Secondary | ICD-10-CM

## 2021-02-21 DIAGNOSIS — I2699 Other pulmonary embolism without acute cor pulmonale: Secondary | ICD-10-CM

## 2021-02-21 DIAGNOSIS — Z7901 Long term (current) use of anticoagulants: Secondary | ICD-10-CM | POA: Diagnosis not present

## 2021-02-21 DIAGNOSIS — Z5181 Encounter for therapeutic drug level monitoring: Secondary | ICD-10-CM

## 2021-03-07 ENCOUNTER — Telehealth: Payer: Self-pay

## 2021-03-07 NOTE — Telephone Encounter (Signed)
TRIED CALLING BOTH NUMBERS AND WASN'T ABLE TO LMOM FOR OVERDUE INR

## 2021-03-08 ENCOUNTER — Ambulatory Visit (INDEPENDENT_AMBULATORY_CARE_PROVIDER_SITE_OTHER): Payer: Medicare Other | Admitting: Internal Medicine

## 2021-03-08 DIAGNOSIS — Z7901 Long term (current) use of anticoagulants: Secondary | ICD-10-CM | POA: Diagnosis not present

## 2021-03-08 DIAGNOSIS — I482 Chronic atrial fibrillation, unspecified: Secondary | ICD-10-CM

## 2021-03-08 DIAGNOSIS — I2699 Other pulmonary embolism without acute cor pulmonale: Secondary | ICD-10-CM | POA: Diagnosis not present

## 2021-03-08 DIAGNOSIS — Z5181 Encounter for therapeutic drug level monitoring: Secondary | ICD-10-CM | POA: Diagnosis not present

## 2021-03-08 LAB — POCT INR: INR: 3.4 — AB (ref 2.0–3.0)

## 2021-03-09 NOTE — Patient Instructions (Signed)
Spoke w/ pt's wife & advised her to have pt: - skip warfarin tonight, then - Continue with 1.25 mg once daily.  - Repeat INR in 2 weeks

## 2021-03-13 ENCOUNTER — Other Ambulatory Visit: Payer: Self-pay | Admitting: Cardiology

## 2021-03-16 ENCOUNTER — Telehealth: Payer: Self-pay | Admitting: Cardiology

## 2021-03-16 NOTE — Telephone Encounter (Signed)
   [facility] Pre-operative Risk Assessment    Patient Name: Jerome Newman  DOB: June 25, 1930 MRN: [id number]  HEARTCARE STAFF:  - IMPORTANT!!!!!! Under Visit Info/Reason for Call, type in Other and utilize the format Clearance MM/DD/YY or Clearance TBD. Do not use dashes or single digits. - Please review there is not already an duplicate clearance open for this procedure. - If request is for dental extraction, please clarify the # of teeth to be extracted. - If the patient is currently at the dentist's office, call Pre-Op Callback Staff (MA/nurse) to input urgent request.  - If the patient is not currently in the dentist office, please route to the Pre-Op pool.  Request for surgical clearance:  What type of surgery is being performed? EGD  When is this surgery scheduled? TBD  What type of clearance is required (medical clearance vs. Pharmacy clearance to hold med vs. Both)? Pharmacy   Are there any medications that need to be held prior to surgery and how long? Comadin for 5 prior  Practice name and name of physician performing surgery? [facility]; Dr. Jhonnie Garner  What is the office phone number? [phone]   7.   What is the office fax number? [phone]  8.   Anesthesia type (None, local, MAC, general) ? Propofol   Jerome Newman 03/16/2021, 1:54 PM  _________________________________________________________________   (provider comments below)

## 2021-03-17 NOTE — Telephone Encounter (Signed)
Patient with diagnosis of afib on warfarin for anticoagulation.    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 The patient's score is based upon: CHF History: 1 HTN History: 1 Diabetes History: 0 Stroke History: 0 Vascular Disease History: 1 Age Score: 2 Gender Score: 0    Patient with remote hx of DVT/PE will confirm with Dr. Bettina Gavia that patient may hold warfarin without a bridge.

## 2021-03-17 NOTE — Telephone Encounter (Signed)
Routing to pharmacy regarding Coumadin

## 2021-03-17 NOTE — Telephone Encounter (Signed)
Was calling back for update. Stated there was none asking that it it be mark as urgent

## 2021-03-20 NOTE — Telephone Encounter (Signed)
Merrilee Seashore wanted to see if we can fax over the clearance for the patient to withhold his coumadin for 5 days.  [facility] Fax:[phone]  Attn to: Merrilee Seashore

## 2021-03-20 NOTE — Telephone Encounter (Signed)
   Primary Cardiologist: Shirlee More, MD  Chart reviewed as part of pre-operative protocol coverage. Given past medical history and time since last visit, based on ACC/AHA guidelines, Jerome Newman would be at acceptable risk for the planned procedure without further cardiovascular testing.   Patient with diagnosis of afib on warfarin for anticoagulation.     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 The patient's score is based upon: CHF History: 1 HTN History: 1 Diabetes History: 0 Stroke History: 0 Vascular Disease History: 1 Age Score: 2 Gender Score: 0    His Coumadin may be held for 5 days prior to his procedure.  Please resume as soon as hemostasis is achieved.  I will route this recommendation to the requesting party via Epic fax function and remove from pre-op pool.  Please call with questions.  Jossie Ng. Tarika Mckethan NP-C    03/20/2021, 7:54 AM [address] Office [phone] Fax [phone]

## 2021-03-23 ENCOUNTER — Ambulatory Visit (INDEPENDENT_AMBULATORY_CARE_PROVIDER_SITE_OTHER): Payer: Medicare Other | Admitting: Cardiology

## 2021-03-23 DIAGNOSIS — Z5181 Encounter for therapeutic drug level monitoring: Secondary | ICD-10-CM

## 2021-03-23 LAB — POCT INR: INR: 3.9 — AB (ref 2.0–3.0)

## 2021-03-23 MED ORDER — WARFARIN SODIUM 1 MG PO TABS: ORAL_TABLET | ORAL | 1 refills | Status: AC

## 2021-03-23 NOTE — Patient Instructions (Signed)
Description   Called and spoke to pt's wife and gave the following instructions: -Stop using warfarin 2.5 mg tablets, Start using warfarin 1mg  tablets -Hold warfarin 9/22-9/26 -Resume warfarin on 9/27 or when the Dr says it is OKAY, take 1.5 tablets (1.5mg ) for 2 days then START taking 1 tablet (1mg ) daily except for 1.5 tablets (1.5mg ) on Tuesday and Thursdays. Recheck INR 1 week post procedure. Pt is a self tester and usually checks every 2 weeks.   As of next check, INR goal is 2.0-3.0 after review with Dr. Bettina Gavia.  Anti-coag track has been changed (previously was 2.0-3.5)

## 2021-03-24 ENCOUNTER — Telehealth: Payer: Self-pay | Admitting: Cardiology

## 2021-03-24 NOTE — Telephone Encounter (Signed)
I spoke to the pt's wife who informed me that his procedure was cancelled so there was no need to Hold Coumadin.  I told her to resume normal dosing and check INR 9/30.  She verbalized understanding.

## 2021-03-24 NOTE — Telephone Encounter (Signed)
Pt c/o medication issue:  1. Name of Medication: warfarin (COUMADIN) 1 MG tablet  2. How are you currently taking this medication (dosage and times per day)? Patient has not taken medication since 9/21  3. Are you having a reaction (difficulty breathing--STAT)?   4. What is your medication issue? Wife states patient was holding Coumadin for procedure Tuesday 03/28/21. The procedure has been cancelled and the wife wants to know what to do to re-start the patient on this medicine.  If the wife is not available, please leave a detailed message on her mailbox. She will be at a Doctors appointment with her Mother and may not be able to answer

## 2021-03-31 ENCOUNTER — Telehealth: Payer: Self-pay

## 2021-03-31 NOTE — Telephone Encounter (Signed)
I spoke to pt's wife and was informed that he is holding Coumadin now through Monday for procedure next week.  She will still check INR today.

## 2021-04-03 ENCOUNTER — Telehealth: Payer: Self-pay

## 2021-04-03 NOTE — Telephone Encounter (Signed)
Lpm to check INR 

## 2021-04-04 NOTE — Telephone Encounter (Signed)
Lmom to check inr

## 2021-04-17 ENCOUNTER — Ambulatory Visit (INDEPENDENT_AMBULATORY_CARE_PROVIDER_SITE_OTHER): Payer: Medicare Other | Admitting: Cardiology

## 2021-04-17 DIAGNOSIS — Z7901 Long term (current) use of anticoagulants: Secondary | ICD-10-CM

## 2021-04-17 DIAGNOSIS — I482 Chronic atrial fibrillation, unspecified: Secondary | ICD-10-CM

## 2021-04-17 DIAGNOSIS — Z5181 Encounter for therapeutic drug level monitoring: Secondary | ICD-10-CM

## 2021-04-17 LAB — POCT INR: INR: 1.3 — AB (ref 2.0–3.0)

## 2021-04-24 LAB — POCT INR: INR: 2.5 (ref 2.0–3.0)

## 2021-04-25 ENCOUNTER — Ambulatory Visit (INDEPENDENT_AMBULATORY_CARE_PROVIDER_SITE_OTHER): Payer: Medicare Other | Admitting: Cardiovascular Disease

## 2021-04-25 DIAGNOSIS — Z7901 Long term (current) use of anticoagulants: Secondary | ICD-10-CM

## 2021-04-25 DIAGNOSIS — Z5181 Encounter for therapeutic drug level monitoring: Secondary | ICD-10-CM

## 2021-04-25 DIAGNOSIS — I482 Chronic atrial fibrillation, unspecified: Secondary | ICD-10-CM

## 2021-04-25 NOTE — Patient Instructions (Signed)
Description   Called and spoke to pt's wife and gave the following instructions: Continue taking 1 tablet (1mg ) daily except for 1.5 tablets (1.5mg ) on Tuesday and Thursdays. Recheck INR 2 weeks .   (Pt is a self tester and usually checks every 2 weeks)

## 2021-05-09 ENCOUNTER — Telehealth: Payer: Self-pay | Admitting: *Deleted

## 2021-05-09 NOTE — Telephone Encounter (Signed)
Pt due to have INR checked. Called pt and LMOM.

## 2021-05-11 ENCOUNTER — Telehealth: Payer: Self-pay

## 2021-05-11 ENCOUNTER — Telehealth: Payer: Self-pay | Admitting: Cardiology

## 2021-05-11 NOTE — Telephone Encounter (Signed)
Lpm to check INR 

## 2021-05-11 NOTE — Telephone Encounter (Signed)
Patient's wife called to inform the patient is in hospice care. She states they have given his hours-days to live.

## 2021-05-17 ENCOUNTER — Telehealth: Payer: Self-pay

## 2021-05-17 NOTE — Telephone Encounter (Signed)
Lp's wife message inquiring about health status.

## 2021-06-01 DEATH — deceased

## 2021-06-02 ENCOUNTER — Telehealth: Payer: Self-pay

## 2021-06-02 NOTE — Telephone Encounter (Signed)
Lp's wife [name]) message to call back and update pt's health status.

## 2021-07-02 DEATH — deceased
